# Patient Record
Sex: Male | Born: 1935 | Race: White | Hispanic: No | Smoking: Former smoker
Health system: Southern US, Community
[De-identification: ages and names within clinical notes are randomized; demographics above are authoritative.]

## PROBLEM LIST (undated history)

## (undated) DIAGNOSIS — K219 Gastro-esophageal reflux disease without esophagitis: Secondary | ICD-10-CM

## (undated) DIAGNOSIS — E785 Hyperlipidemia, unspecified: Secondary | ICD-10-CM

## (undated) DIAGNOSIS — M1990 Unspecified osteoarthritis, unspecified site: Secondary | ICD-10-CM

## (undated) DIAGNOSIS — N289 Disorder of kidney and ureter, unspecified: Secondary | ICD-10-CM

## (undated) DIAGNOSIS — I82401 Acute embolism and thrombosis of unspecified deep veins of right lower extremity: Principal | ICD-10-CM

## (undated) DIAGNOSIS — Z860101 Personal history of adenomatous and serrated colon polyps: Secondary | ICD-10-CM

## (undated) DIAGNOSIS — J449 Chronic obstructive pulmonary disease, unspecified: Secondary | ICD-10-CM

## (undated) DIAGNOSIS — I10 Essential (primary) hypertension: Secondary | ICD-10-CM

## (undated) DIAGNOSIS — J8410 Pulmonary fibrosis, unspecified: Secondary | ICD-10-CM

## (undated) DIAGNOSIS — N529 Male erectile dysfunction, unspecified: Secondary | ICD-10-CM

## (undated) DIAGNOSIS — Z86010 Personal history of colonic polyps: Secondary | ICD-10-CM

## (undated) HISTORY — PX: CARDIOVASCULAR STRESS TEST: SHX262

## (undated) HISTORY — DX: Disorder of kidney and ureter, unspecified: N28.9

## (undated) HISTORY — DX: Unspecified osteoarthritis, unspecified site: M19.90

## (undated) HISTORY — DX: Personal history of colonic polyps: Z86.010

## (undated) HISTORY — DX: Hyperlipidemia, unspecified: E78.5

## (undated) HISTORY — DX: Pulmonary fibrosis, unspecified: J84.10

## (undated) HISTORY — DX: Essential (primary) hypertension: I10

## (undated) HISTORY — DX: Chronic obstructive pulmonary disease, unspecified: J44.9

## (undated) HISTORY — PX: CATARACT EXTRACTION: SUR2

## (undated) HISTORY — DX: Male erectile dysfunction, unspecified: N52.9

## (undated) HISTORY — PX: ORIF CLAVICLE FRACTURE: SUR924

## (undated) HISTORY — DX: Personal history of adenomatous and serrated colon polyps: Z86.0101

## (undated) HISTORY — PX: LUNG BIOPSY: SHX232

## (undated) HISTORY — DX: Acute embolism and thrombosis of unspecified deep veins of right lower extremity: I82.401

## (undated) HISTORY — DX: Gastro-esophageal reflux disease without esophagitis: K21.9

## (undated) HISTORY — PX: ORIF ULNAR FRACTURE: SHX5417

---

## 2000-05-27 LAB — HM COLONOSCOPY

## 2002-05-27 LAB — FECAL OCCULT BLOOD, GUAIAC: Fecal Occult Blood: NEGATIVE

## 2002-12-09 ENCOUNTER — Encounter: Payer: Self-pay | Admitting: Internal Medicine

## 2002-12-09 LAB — CONVERTED CEMR LAB: PSA: 1.42 ng/mL

## 2003-01-09 ENCOUNTER — Encounter: Payer: Self-pay | Admitting: *Deleted

## 2003-01-12 ENCOUNTER — Ambulatory Visit (HOSPITAL_COMMUNITY): Admission: RE | Admit: 2003-01-12 | Discharge: 2003-01-12 | Payer: Self-pay | Admitting: *Deleted

## 2003-07-14 ENCOUNTER — Other Ambulatory Visit: Payer: Self-pay

## 2004-04-02 ENCOUNTER — Ambulatory Visit: Payer: Self-pay | Admitting: Internal Medicine

## 2004-04-22 ENCOUNTER — Ambulatory Visit: Payer: Self-pay | Admitting: Internal Medicine

## 2004-05-23 ENCOUNTER — Ambulatory Visit: Payer: Self-pay | Admitting: Internal Medicine

## 2004-06-20 ENCOUNTER — Ambulatory Visit: Payer: Self-pay | Admitting: Internal Medicine

## 2004-07-18 ENCOUNTER — Ambulatory Visit: Payer: Self-pay | Admitting: Internal Medicine

## 2004-08-02 ENCOUNTER — Ambulatory Visit: Payer: Self-pay | Admitting: Internal Medicine

## 2004-08-15 ENCOUNTER — Ambulatory Visit: Payer: Self-pay | Admitting: Internal Medicine

## 2004-09-13 ENCOUNTER — Ambulatory Visit: Payer: Self-pay | Admitting: Internal Medicine

## 2004-10-04 ENCOUNTER — Ambulatory Visit: Payer: Self-pay

## 2004-10-11 ENCOUNTER — Ambulatory Visit: Payer: Self-pay | Admitting: Internal Medicine

## 2004-11-07 ENCOUNTER — Ambulatory Visit: Payer: Self-pay | Admitting: Internal Medicine

## 2004-11-08 ENCOUNTER — Ambulatory Visit: Payer: Self-pay | Admitting: Internal Medicine

## 2004-12-06 ENCOUNTER — Ambulatory Visit: Payer: Self-pay | Admitting: Internal Medicine

## 2005-03-03 ENCOUNTER — Ambulatory Visit: Payer: Self-pay | Admitting: Internal Medicine

## 2005-03-18 ENCOUNTER — Ambulatory Visit: Payer: Self-pay | Admitting: Internal Medicine

## 2005-05-09 ENCOUNTER — Ambulatory Visit: Payer: Self-pay | Admitting: Internal Medicine

## 2005-05-28 ENCOUNTER — Ambulatory Visit: Payer: Self-pay | Admitting: Internal Medicine

## 2005-06-06 ENCOUNTER — Ambulatory Visit: Payer: Self-pay

## 2005-08-06 ENCOUNTER — Ambulatory Visit: Payer: Self-pay | Admitting: Internal Medicine

## 2005-11-18 ENCOUNTER — Ambulatory Visit: Payer: Self-pay | Admitting: Internal Medicine

## 2005-12-23 ENCOUNTER — Ambulatory Visit: Payer: Self-pay | Admitting: Internal Medicine

## 2006-02-27 ENCOUNTER — Ambulatory Visit: Payer: Self-pay | Admitting: Internal Medicine

## 2006-04-24 ENCOUNTER — Ambulatory Visit: Payer: Self-pay | Admitting: Internal Medicine

## 2006-05-06 ENCOUNTER — Ambulatory Visit: Payer: Self-pay | Admitting: Internal Medicine

## 2006-07-07 ENCOUNTER — Ambulatory Visit: Payer: Self-pay | Admitting: Internal Medicine

## 2006-07-16 ENCOUNTER — Ambulatory Visit: Payer: Self-pay | Admitting: Internal Medicine

## 2006-07-25 ENCOUNTER — Ambulatory Visit: Payer: Self-pay | Admitting: Internal Medicine

## 2006-08-06 ENCOUNTER — Ambulatory Visit: Payer: Self-pay | Admitting: Internal Medicine

## 2006-08-25 ENCOUNTER — Ambulatory Visit: Payer: Self-pay | Admitting: Internal Medicine

## 2006-09-24 ENCOUNTER — Ambulatory Visit: Payer: Self-pay | Admitting: Internal Medicine

## 2006-10-13 ENCOUNTER — Encounter: Payer: Self-pay | Admitting: Internal Medicine

## 2006-10-13 DIAGNOSIS — J309 Allergic rhinitis, unspecified: Secondary | ICD-10-CM | POA: Insufficient documentation

## 2006-10-13 DIAGNOSIS — J439 Emphysema, unspecified: Secondary | ICD-10-CM

## 2006-10-13 DIAGNOSIS — K219 Gastro-esophageal reflux disease without esophagitis: Secondary | ICD-10-CM

## 2006-10-13 DIAGNOSIS — E785 Hyperlipidemia, unspecified: Secondary | ICD-10-CM

## 2006-10-22 ENCOUNTER — Ambulatory Visit: Payer: Self-pay | Admitting: Internal Medicine

## 2006-10-23 LAB — CONVERTED CEMR LAB
CO2: 30 meq/L (ref 19–32)
Cholesterol: 111 mg/dL (ref 0–200)
Creatinine, Ser: 1.6 mg/dL — ABNORMAL HIGH (ref 0.4–1.5)
LDL Cholesterol: 55 mg/dL (ref 0–99)
Phosphorus: 3.5 mg/dL (ref 2.3–4.6)
Potassium: 3.6 meq/L (ref 3.5–5.1)
Sodium: 145 meq/L (ref 135–145)
Total CHOL/HDL Ratio: 3.5
Triglycerides: 124 mg/dL (ref 0–149)

## 2006-11-17 ENCOUNTER — Telehealth (INDEPENDENT_AMBULATORY_CARE_PROVIDER_SITE_OTHER): Payer: Self-pay | Admitting: *Deleted

## 2006-12-17 ENCOUNTER — Encounter: Payer: Self-pay | Admitting: Internal Medicine

## 2007-04-13 ENCOUNTER — Telehealth (INDEPENDENT_AMBULATORY_CARE_PROVIDER_SITE_OTHER): Payer: Self-pay | Admitting: *Deleted

## 2007-04-20 ENCOUNTER — Ambulatory Visit: Payer: Self-pay | Admitting: Internal Medicine

## 2007-04-20 DIAGNOSIS — N184 Chronic kidney disease, stage 4 (severe): Secondary | ICD-10-CM

## 2007-04-26 LAB — CONVERTED CEMR LAB
Albumin: 3.6 g/dL (ref 3.5–5.2)
GFR calc Af Amer: 63 mL/min
Glucose, Bld: 114 mg/dL — ABNORMAL HIGH (ref 70–99)
HDL: 32.5 mg/dL — ABNORMAL LOW (ref >39.0)
LDL Cholesterol: 55 mg/dL (ref 0–99)
Phosphorus: 3.3 mg/dL (ref 2.3–4.6)
Potassium: 4 meq/L (ref 3.5–5.1)
Sodium: 146 meq/L — ABNORMAL HIGH (ref 135–145)
TSH: 1.94 microintl units/mL (ref 0.35–5.50)
Total CHOL/HDL Ratio: 3.5
Triglycerides: 128 mg/dL (ref 0–149)
VLDL: 26 mg/dL (ref 0–40)

## 2007-06-09 ENCOUNTER — Telehealth (INDEPENDENT_AMBULATORY_CARE_PROVIDER_SITE_OTHER): Payer: Self-pay | Admitting: *Deleted

## 2007-07-08 ENCOUNTER — Encounter: Payer: Self-pay | Admitting: Internal Medicine

## 2007-08-06 ENCOUNTER — Telehealth (INDEPENDENT_AMBULATORY_CARE_PROVIDER_SITE_OTHER): Payer: Self-pay | Admitting: *Deleted

## 2007-08-18 ENCOUNTER — Encounter: Payer: Self-pay | Admitting: Internal Medicine

## 2007-08-18 ENCOUNTER — Ambulatory Visit: Payer: Self-pay | Admitting: Internal Medicine

## 2007-09-20 ENCOUNTER — Telehealth (INDEPENDENT_AMBULATORY_CARE_PROVIDER_SITE_OTHER): Payer: Self-pay | Admitting: *Deleted

## 2007-09-29 ENCOUNTER — Ambulatory Visit: Payer: Self-pay | Admitting: Internal Medicine

## 2007-10-15 LAB — CONVERTED CEMR LAB
ALT: 21 units/L (ref 0–53)
Chloride: 107 meq/L (ref 96–112)
Creatinine,U: 25.2 mg/dL
GFR calc Af Amer: 76 mL/min
Glucose, Bld: 190 mg/dL — ABNORMAL HIGH (ref 70–99)
HDL: 29.1 mg/dL — ABNORMAL LOW (ref >39.0)
LDL Cholesterol: 48 mg/dL (ref 0–99)
Microalb, Ur: 0.8 mg/dL (ref 0.0–1.9)
Phosphorus: 1.7 mg/dL — ABNORMAL LOW (ref 2.3–4.6)
Potassium: 3.2 meq/L — ABNORMAL LOW (ref 3.5–5.1)
Sodium: 146 meq/L — ABNORMAL HIGH (ref 135–145)
Total Bilirubin: 1.2 mg/dL (ref 0.3–1.2)
VLDL: 32 mg/dL (ref 0–40)

## 2007-10-21 ENCOUNTER — Ambulatory Visit: Payer: Self-pay | Admitting: Internal Medicine

## 2007-10-21 LAB — CONVERTED CEMR LAB
BUN: 23 mg/dL (ref 6–23)
Calcium: 9 mg/dL (ref 8.4–10.5)
Creatinine, Ser: 1.4 mg/dL (ref 0.4–1.5)
Phosphorus: 3.4 mg/dL (ref 2.3–4.6)
Potassium: 3.5 meq/L (ref 3.5–5.1)

## 2007-11-15 ENCOUNTER — Telehealth: Payer: Self-pay | Admitting: Internal Medicine

## 2007-12-01 ENCOUNTER — Telehealth (INDEPENDENT_AMBULATORY_CARE_PROVIDER_SITE_OTHER): Payer: Self-pay | Admitting: *Deleted

## 2008-01-10 ENCOUNTER — Telehealth: Payer: Self-pay | Admitting: Family Medicine

## 2008-02-09 ENCOUNTER — Telehealth: Payer: Self-pay | Admitting: Internal Medicine

## 2008-03-30 ENCOUNTER — Telehealth: Payer: Self-pay | Admitting: Internal Medicine

## 2008-04-04 ENCOUNTER — Ambulatory Visit: Payer: Self-pay | Admitting: Internal Medicine

## 2008-04-05 LAB — CONVERTED CEMR LAB
AST: 21 units/L (ref 0–37)
Albumin: 3.6 g/dL (ref 3.5–5.2)
Alkaline Phosphatase: 111 units/L (ref 39–117)
Basophils Relative: 0.3 % (ref 0.0–3.0)
CO2: 34 meq/L — ABNORMAL HIGH (ref 19–32)
Calcium: 9 mg/dL (ref 8.4–10.5)
Creatinine, Ser: 1.2 mg/dL (ref 0.4–1.5)
Eosinophils Relative: 2.8 % (ref 0.0–5.0)
GFR calc non Af Amer: 62 mL/min
LDL Cholesterol: 47 mg/dL (ref 0–99)
Lymphocytes Relative: 33.2 % (ref 12.0–46.0)
Neutrophils Relative %: 55.6 % (ref 43.0–77.0)
RBC: 5.01 M/uL (ref 4.22–5.81)
Total Protein: 7 g/dL (ref 6.0–8.3)
VLDL: 31 mg/dL (ref 0–40)
WBC: 7.6 10*3/uL (ref 4.5–10.5)

## 2008-04-26 ENCOUNTER — Encounter: Payer: Self-pay | Admitting: Internal Medicine

## 2008-04-26 ENCOUNTER — Ambulatory Visit: Payer: Self-pay

## 2008-05-01 ENCOUNTER — Encounter: Payer: Self-pay | Admitting: Internal Medicine

## 2008-05-02 ENCOUNTER — Encounter: Payer: Self-pay | Admitting: Internal Medicine

## 2008-05-02 DIAGNOSIS — R21 Rash and other nonspecific skin eruption: Secondary | ICD-10-CM

## 2008-05-09 ENCOUNTER — Telehealth: Payer: Self-pay | Admitting: Internal Medicine

## 2008-05-24 ENCOUNTER — Ambulatory Visit: Payer: Self-pay | Admitting: Family Medicine

## 2008-05-25 ENCOUNTER — Telehealth (INDEPENDENT_AMBULATORY_CARE_PROVIDER_SITE_OTHER): Payer: Self-pay | Admitting: Internal Medicine

## 2008-06-01 ENCOUNTER — Ambulatory Visit: Payer: Self-pay | Admitting: Family Medicine

## 2008-06-01 DIAGNOSIS — IMO0002 Reserved for concepts with insufficient information to code with codable children: Secondary | ICD-10-CM | POA: Insufficient documentation

## 2008-06-13 ENCOUNTER — Ambulatory Visit: Payer: Self-pay | Admitting: Internal Medicine

## 2008-06-16 ENCOUNTER — Ambulatory Visit: Payer: Self-pay | Admitting: Internal Medicine

## 2008-07-05 ENCOUNTER — Ambulatory Visit: Payer: Self-pay | Admitting: Internal Medicine

## 2008-07-06 LAB — CONVERTED CEMR LAB
BUN: 20 mg/dL (ref 6–23)
CO2: 34 meq/L — ABNORMAL HIGH (ref 19–32)
GFR calc non Af Amer: 62 mL/min
Glucose, Bld: 119 mg/dL — ABNORMAL HIGH (ref 70–99)
Hgb A1c MFr Bld: 7 % — ABNORMAL HIGH (ref 4.6–6.0)
Potassium: 3.7 meq/L (ref 3.5–5.1)
Sodium: 146 meq/L — ABNORMAL HIGH (ref 135–145)

## 2008-07-18 ENCOUNTER — Telehealth: Payer: Self-pay | Admitting: Internal Medicine

## 2008-07-20 ENCOUNTER — Encounter: Payer: Self-pay | Admitting: Internal Medicine

## 2008-07-20 ENCOUNTER — Ambulatory Visit: Payer: Self-pay | Admitting: Internal Medicine

## 2008-09-11 ENCOUNTER — Encounter: Payer: Self-pay | Admitting: Internal Medicine

## 2008-10-09 ENCOUNTER — Ambulatory Visit: Payer: Self-pay | Admitting: Family Medicine

## 2008-11-01 ENCOUNTER — Telehealth: Payer: Self-pay | Admitting: Internal Medicine

## 2008-11-10 ENCOUNTER — Ambulatory Visit: Payer: Self-pay | Admitting: Internal Medicine

## 2008-11-13 LAB — CONVERTED CEMR LAB
ALT: 24 units/L (ref 0–53)
AST: 29 units/L (ref 0–37)
Albumin: 3.9 g/dL (ref 3.5–5.2)
Basophils Absolute: 0 10*3/uL (ref 0.0–0.1)
Basophils Relative: 0.4 % (ref 0.0–3.0)
CO2: 33 meq/L — ABNORMAL HIGH (ref 19–32)
Cholesterol: 113 mg/dL (ref 0–200)
Creatinine, Ser: 1.3 mg/dL (ref 0.4–1.5)
Eosinophils Absolute: 0.3 10*3/uL (ref 0.0–0.7)
Glucose, Bld: 130 mg/dL — ABNORMAL HIGH (ref 70–99)
HDL: 39.2 mg/dL (ref >39.00)
Hgb A1c MFr Bld: 6.9 % — ABNORMAL HIGH (ref 4.6–6.5)
Lymphocytes Relative: 37.5 % (ref 12.0–46.0)
MCHC: 34.4 g/dL (ref 30.0–36.0)
Monocytes Relative: 7.1 % (ref 3.0–12.0)
Neutrophils Relative %: 51.7 % (ref 43.0–77.0)
Phosphorus: 2.8 mg/dL (ref 2.3–4.6)
RBC: 4.95 M/uL (ref 4.22–5.81)
Total Protein: 7.3 g/dL (ref 6.0–8.3)
Triglycerides: 126 mg/dL (ref 0.0–149.0)
VLDL: 25.2 mg/dL (ref 0.0–40.0)

## 2009-01-24 ENCOUNTER — Ambulatory Visit: Payer: Self-pay | Admitting: Family Medicine

## 2009-02-15 ENCOUNTER — Telehealth: Payer: Self-pay | Admitting: Internal Medicine

## 2009-03-14 ENCOUNTER — Ambulatory Visit: Payer: Self-pay | Admitting: Internal Medicine

## 2009-03-20 LAB — CONVERTED CEMR LAB
Basophils Relative: 0.6 % (ref 0.0–3.0)
Calcium: 9.2 mg/dL (ref 8.4–10.5)
Eosinophils Relative: 4.2 % (ref 0.0–5.0)
Glucose, Bld: 145 mg/dL — ABNORMAL HIGH (ref 70–99)
Lymphocytes Relative: 31.4 % (ref 12.0–46.0)
Neutrophils Relative %: 56.5 % (ref 43.0–77.0)
Phosphorus: 2.1 mg/dL — ABNORMAL LOW (ref 2.3–4.6)
Potassium: 4.1 meq/L (ref 3.5–5.1)
RBC: 5.18 M/uL (ref 4.22–5.81)
Sodium: 148 meq/L — ABNORMAL HIGH (ref 135–145)
WBC: 10.2 10*3/uL (ref 4.5–10.5)

## 2009-03-22 ENCOUNTER — Ambulatory Visit: Payer: Self-pay | Admitting: Family Medicine

## 2009-03-22 ENCOUNTER — Encounter (INDEPENDENT_AMBULATORY_CARE_PROVIDER_SITE_OTHER): Payer: Self-pay | Admitting: Internal Medicine

## 2009-03-29 ENCOUNTER — Telehealth (INDEPENDENT_AMBULATORY_CARE_PROVIDER_SITE_OTHER): Payer: Self-pay | Admitting: Internal Medicine

## 2009-04-03 ENCOUNTER — Telehealth: Payer: Self-pay | Admitting: Internal Medicine

## 2009-05-09 ENCOUNTER — Ambulatory Visit: Payer: Self-pay | Admitting: Cardiovascular Disease

## 2009-05-09 ENCOUNTER — Inpatient Hospital Stay: Payer: Self-pay | Admitting: Internal Medicine

## 2009-05-09 ENCOUNTER — Ambulatory Visit: Payer: Self-pay | Admitting: Internal Medicine

## 2009-05-10 ENCOUNTER — Telehealth: Payer: Self-pay | Admitting: Internal Medicine

## 2009-05-12 ENCOUNTER — Encounter: Payer: Self-pay | Admitting: Internal Medicine

## 2009-05-22 ENCOUNTER — Ambulatory Visit: Payer: Self-pay | Admitting: Internal Medicine

## 2009-05-30 ENCOUNTER — Ambulatory Visit: Payer: Self-pay | Admitting: Internal Medicine

## 2009-05-30 ENCOUNTER — Encounter: Payer: Self-pay | Admitting: Internal Medicine

## 2009-06-19 ENCOUNTER — Telehealth: Payer: Self-pay | Admitting: Internal Medicine

## 2009-06-20 ENCOUNTER — Encounter: Payer: Self-pay | Admitting: Internal Medicine

## 2009-07-09 ENCOUNTER — Ambulatory Visit: Payer: Self-pay | Admitting: Internal Medicine

## 2009-07-13 ENCOUNTER — Encounter: Payer: Self-pay | Admitting: Internal Medicine

## 2009-07-26 ENCOUNTER — Telehealth: Payer: Self-pay | Admitting: Internal Medicine

## 2009-08-06 ENCOUNTER — Ambulatory Visit: Payer: Self-pay | Admitting: Internal Medicine

## 2009-08-07 LAB — CONVERTED CEMR LAB
ALT: 20 units/L (ref 0–53)
CO2: 32 meq/L (ref 19–32)
Chloride: 111 meq/L (ref 96–112)
Eosinophils Relative: 4.6 % (ref 0.0–5.0)
GFR calc non Af Amer: 62.02 mL/min (ref >60)
HCT: 45.6 % (ref 39.0–52.0)
HDL: 39.1 mg/dL (ref >39.00)
Hemoglobin: 15.6 g/dL (ref 13.0–17.0)
Hgb A1c MFr Bld: 7.3 % — ABNORMAL HIGH (ref 4.6–6.5)
Lymphs Abs: 2.8 10*3/uL (ref 0.7–4.0)
Monocytes Relative: 7.9 % (ref 3.0–12.0)
Neutro Abs: 4.7 10*3/uL (ref 1.4–7.7)
Platelets: 223 10*3/uL (ref 150.0–400.0)
Sodium: 147 meq/L — ABNORMAL HIGH (ref 135–145)
TSH: 1.64 microintl units/mL (ref 0.35–5.50)
Total Bilirubin: 0.7 mg/dL (ref 0.3–1.2)
Total Protein: 7.4 g/dL (ref 6.0–8.3)
Triglycerides: 120 mg/dL (ref 0.0–149.0)
VLDL: 24 mg/dL (ref 0.0–40.0)
WBC: 8.7 10*3/uL (ref 4.5–10.5)

## 2009-10-24 LAB — HM DIABETES EYE EXAM

## 2009-11-21 ENCOUNTER — Telehealth: Payer: Self-pay | Admitting: Internal Medicine

## 2009-12-04 ENCOUNTER — Telehealth: Payer: Self-pay | Admitting: Internal Medicine

## 2010-01-09 ENCOUNTER — Ambulatory Visit: Payer: Self-pay | Admitting: Family Medicine

## 2010-01-09 DIAGNOSIS — R05 Cough: Secondary | ICD-10-CM

## 2010-02-11 ENCOUNTER — Ambulatory Visit: Payer: Self-pay | Admitting: Internal Medicine

## 2010-02-11 DIAGNOSIS — R1031 Right lower quadrant pain: Secondary | ICD-10-CM

## 2010-02-12 LAB — CONVERTED CEMR LAB
Calcium: 9.2 mg/dL (ref 8.4–10.5)
Glucose, Bld: 152 mg/dL — ABNORMAL HIGH (ref 70–99)
Hgb A1c MFr Bld: 7.3 % — ABNORMAL HIGH (ref 4.6–6.5)
Potassium: 3.6 meq/L (ref 3.5–5.1)
Sodium: 145 meq/L (ref 135–145)

## 2010-04-04 ENCOUNTER — Telehealth: Payer: Self-pay | Admitting: Family Medicine

## 2010-04-04 ENCOUNTER — Ambulatory Visit: Payer: Self-pay | Admitting: Family Medicine

## 2010-04-04 DIAGNOSIS — R079 Chest pain, unspecified: Secondary | ICD-10-CM

## 2010-04-09 ENCOUNTER — Telehealth: Payer: Self-pay | Admitting: Internal Medicine

## 2010-04-09 ENCOUNTER — Ambulatory Visit: Payer: Self-pay | Admitting: Internal Medicine

## 2010-04-09 DIAGNOSIS — M1990 Unspecified osteoarthritis, unspecified site: Secondary | ICD-10-CM | POA: Insufficient documentation

## 2010-04-11 ENCOUNTER — Encounter: Payer: Self-pay | Admitting: Internal Medicine

## 2010-04-17 ENCOUNTER — Telehealth (INDEPENDENT_AMBULATORY_CARE_PROVIDER_SITE_OTHER): Payer: Self-pay | Admitting: *Deleted

## 2010-04-23 ENCOUNTER — Encounter: Payer: Self-pay | Admitting: Internal Medicine

## 2010-04-24 ENCOUNTER — Encounter: Payer: Self-pay | Admitting: Internal Medicine

## 2010-05-09 ENCOUNTER — Encounter: Payer: Self-pay | Admitting: Internal Medicine

## 2010-05-21 ENCOUNTER — Telehealth: Payer: Self-pay | Admitting: Internal Medicine

## 2010-05-22 ENCOUNTER — Ambulatory Visit: Payer: Self-pay | Admitting: Internal Medicine

## 2010-05-22 DIAGNOSIS — M79609 Pain in unspecified limb: Secondary | ICD-10-CM | POA: Insufficient documentation

## 2010-05-29 ENCOUNTER — Encounter: Payer: Self-pay | Admitting: Internal Medicine

## 2010-06-05 ENCOUNTER — Ambulatory Visit: Payer: Self-pay | Admitting: Sports Medicine

## 2010-06-20 ENCOUNTER — Encounter
Admission: RE | Admit: 2010-06-20 | Discharge: 2010-06-20 | Payer: Self-pay | Source: Home / Self Care | Attending: *Deleted | Admitting: *Deleted

## 2010-06-23 LAB — CONVERTED CEMR LAB
Albumin: 3.7 g/dL (ref 3.5–5.2)
BUN: 19 mg/dL (ref 6–23)
Basophils Relative: 0.2 % (ref 0.0–1.0)
CO2: 34 meq/L — ABNORMAL HIGH (ref 19–32)
Cholesterol: 183 mg/dL (ref 0–200)
Creatinine, Ser: 1.2 mg/dL (ref 0.4–1.5)
Eosinophils Absolute: 0.2 10*3/uL (ref 0.0–0.6)
GFR calc non Af Amer: 63 mL/min
HDL: 38 mg/dL — ABNORMAL LOW (ref >39.0)
Hemoglobin: 16.8 g/dL (ref 13.0–17.0)
Hgb A1c MFr Bld: 7.9 % — ABNORMAL HIGH (ref 4.6–6.0)
Lymphocytes Relative: 27.4 % (ref 12.0–46.0)
MCV: 95.3 fL (ref 78.0–100.0)
Monocytes Absolute: 0.5 10*3/uL (ref 0.2–0.7)
Monocytes Relative: 6 % (ref 3.0–11.0)
Neutro Abs: 5.9 10*3/uL (ref 1.4–7.7)
Phosphorus: 2.6 mg/dL (ref 2.3–4.6)
Platelets: 262 10*3/uL (ref 150–400)
Potassium: 3.7 meq/L (ref 3.5–5.1)
Sodium: 142 meq/L (ref 135–145)
Triglycerides: 193 mg/dL — ABNORMAL HIGH (ref 0–149)

## 2010-06-25 NOTE — Progress Notes (Signed)
Summary: Testing supplies ([facility])  Phone Note Other Incoming Call back at [phone] Message from:  Ranelle Oyster on June 19, 2009 9:01 AM  Caller: [facility], [facility] Summary of Call: Received fax for testing supplies.  Called patient and left a message for return call to verify that he knows about and requested there services. Initial call taken by: Linde Gillis CMA Duncan Dull),  June 19, 2009 9:02 AM  Follow-up for Phone Call        Spoke with patient, he is aware and did authorize this company to provide him with his testing supplies, form in your IN box Follow-up by: Linde Gillis CMA Duncan Dull),  June 19, 2009 3:03 PM  Additional Follow-up for Phone Call Additional follow up Details #1::        form done approved three times a day testing just in case. He generally tests two times a day at least Additional Follow-up by: Cindee Salt MD,  June 20, 2009 7:57 AM    Additional Follow-up for Phone Call Additional follow up Details #2::    form faxed and scanned. Follow-up by: Mervin Hack CMA Duncan Dull),  June 20, 2009 9:05 AM

## 2010-06-25 NOTE — Letter (Signed)
Summary: [facility]  [facility]   Imported By: Lester Somerset 04/27/2010 10:38:57  _____________________________________________________________________  External Attachment:    Type:   Image     Comment:   External Document

## 2010-06-25 NOTE — Assessment & Plan Note (Signed)
Summary: EARLY NEXT WEEK FOLLOW UP PER DR G/RBH   Vital Signs:  Patient profile:   77 year old male Weight:      203 pounds O2 Sat:      95 % on Room air Temp:     98.3 degrees F oral Pulse rate:   72 / minute Pulse rhythm:   regular Resp:     24 per minute BP sitting:   150 / 90  (left arm) Cuff size:   large  Vitals Entered By: Mervin Hack CMA Duncan Dull) (April 09, 2010 11:31 AM)  O2 Flow:  Room air CC: follow-up visit   History of Present Illness: Feels a little better No fever Still has some SOB all the time Still has some lower anterior right chest pain---this is better. Still seems pleuritic Hasn't been consistent with oxygen use  only occ cough sometimes get sputum but not too much  sleeping okay but awakens a couple of times---not due to dyspnea  Allergies: 1)  * Theo-Dur (Theophylline)  Past History:  Past medical, surgical, family and social histories (including risk factors) reviewed for relevance to current acute and chronic problems.  Past Medical History: Allergic rhinitis Colonic polyps, hx of--adenomatous COPD/pulm fibrosis--------------------------------------------Dr Freda Munro Diabetes mellitus, type II GERD Erectile dysfunction Cardiomyopathy Hyperlipidemia Renal insufficiency Osteoarthritis  Past Surgical History: Reviewed history from 10/13/2006 and no changes required. CP- ? CAD 08/04 Pneumonia-rehab 2003 Left clavicle fracture- child Left readius/ ulna fracture child Right 1 H x 2 Left 1 H x 1 Right lung bx. negative 2001 Cataracts/ IOL OU, Epes 2005, Pulman ?  2000 Myoview stress negative EF 65-70% 08/04 ABI's normal 01/07  Family History: Reviewed history from 10/13/2006 and no changes required. Father: Died  at age of 58, NIDDM Mother: Died in her 47's, ? heart Siblings: 3 sister, one adopted No cancer  Social History: Reviewed history from 08/06/2009 and no changes required. Marital Status: Widowed Has platonic  lady lfriend since  ~2008  Cleda Daub Children: 2 sons, 1 daughter  Retired- Warden/ranger Currently seeing Corrie Dandy  Review of Systems       appetite is okay no vomiting or diarrhea Back pain is better but still having left knee pain  Physical Exam  General:  alert.  NAD Neck:  supple, no masses, and no cervical lymphadenopathy.   Lungs:  normal respiratory effort, no intercostal retractions, and no accessory muscle use.  Decreased air movement but no wheezes or prolonged expiration Mild bibasilar fine crackles Heart:  normal rate, regular rhythm, no murmur, and no gallop.   Msk:  left knee with some crepitus but no meniscus or ligament findings   Impression & Recommendations:  Problem # 1:  COPD (ICD-496) Assessment Improved close to baseline needs to keep oxygen on finish out meds  His updated medication list for this problem includes:    Duoneb 0.5-2.5 (3) Mg/6ml Soln (Ipratropium-albuterol) .Marland Kitchen... 1 unit in nebulizer every 6 hours    Combivent 103-18 Mcg/act Aero (Ipratropium-albuterol) .Marland Kitchen... 2 puffs every 6 hours when not using nebulizer    Advair Diskus 250-50 Mcg/dose Misc (Fluticasone-salmeterol) ..... Use two times a day  Problem # 2:  OSTEOARTHRITIS (ICD-715.90) Assessment: Comment Only will refill the tramadol  His updated medication list for this problem includes:    Aspirin 81 Mg Tabs (Aspirin) .Marland Kitchen... Take one by mouth daily    Tramadol Hcl 50 Mg Tabs (Tramadol hcl) .Marland Kitchen... Take one by mouth two times a day as needed pain  Complete  Medication List: 1)  Temazepam 30 Mg Caps (Temazepam) .... Take 1 capsule by mouth at bedtime 2)  Klor-con M20 20 Meq Tbcr (Potassium chloride crys cr) .... Take one by mouth every day 3)  Novolin R 100 Unit/ml Soln (Insulin regular human) .Marland Kitchen.. 10 units two times a day 4)  Novolin N 100 Unit/ml Susp (Insulin isophane human) .... 25  units two times a day 5)  Simvastatin 20 Mg Tabs (Simvastatin) .... Take 1 tablet by mouth once a  day 6)  Lasix 80 Mg Tabs (Furosemide) .... Take 1 by mouth once daily 7)  Duoneb 0.5-2.5 (3) Mg/36ml Soln (Ipratropium-albuterol) .Marland Kitchen.. 1 unit in nebulizer every 6 hours 8)  Combivent 103-18 Mcg/act Aero (Ipratropium-albuterol) .... 2 puffs every 6 hours when not using nebulizer 9)  Omeprazole 20 Mg Cpdr (Omeprazole) .Marland Kitchen.. 1 tab daily for acid reflux 10)  Advair Diskus 250-50 Mcg/dose Misc (Fluticasone-salmeterol) .... Use two times a day 11)  Aspirin 81 Mg Tabs (Aspirin) .... Take one by mouth daily 12)  Oxygen  .... Uses 2 liters 13)  Sure Comfort Insulin Syringe 29g X 1/2" 0.3 Ml Misc (Insulin syringe-needle u-100) .... Use as directed 14)  Tramadol Hcl 50 Mg Tabs (Tramadol hcl) .... Take one by mouth two times a day as needed pain 15)  Doxycycline Hyclate 100 Mg Caps (Doxycycline hyclate) .... Take one by mouth two times a day x 10 days  Patient Instructions: 1)  Please keep regular appt in March 2)  Call if your breathing doesn't completely come back to your usual in the next 1-2 weeks Prescriptions: TRAMADOL HCL 50 MG TABS (TRAMADOL HCL) take one by mouth two times a day as needed pain  #60 x 0   Entered and Authorized by:   Cindee Salt MD   Signed by:   Cindee Salt MD on 04/09/2010   Method used:   [facility]        [id number] ([facility]ail[facility]       [address]       [place], [place]  [address]       Ph: [phone] or [phone]       Fax: [phone]   RxID:   [id number] NOVOLIN N 100 UNIT/ML SUSP (INSULIN ISOPHANE HUMAN) 25  units two times a day  #1 month x 11   Entered and Authorized by:   Cindee Salt MD   Signed by:   Cindee Salt MD on 04/09/2010   Method used:   [facility]        [id number] ([facility]ail[facility]       [address]       [place], [place]  [address]       Ph: [phone] or [phone]       Fax: [phone]   RxID:   [id number] TEMAZEPAM 30 MG CAPS  (TEMAZEPAM) Take 1 capsule by mouth at bedtime  #30 x 3   Entered and Authorized by:   Cindee Salt MD   Signed by:   Cindee Salt MD on 04/09/2010   Method used:   Print then Give to Patient   RxID:   [id number] NOVOLIN R 100 UNIT/ML SOLN (INSULIN REGULAR HUMAN) 10 units two times a day  #10 [place] x 12   Entered by:   DeShannon  Katrinka Blazing CMA (AAMA)   Authorized by:   Cindee Salt MD   Signed by:   Mervin Hack CMA (AAMA) on 04/09/2010   Method used:   [facility]        [id number] ([facility]       [address]       [place], [place]  [address]       Ph: [phone] or [phone]       Fax: [phone]   RxID:   [id number]      Current Allergies (reviewed today): * THEO-DUR (THEOPHYLLINE)

## 2010-06-25 NOTE — Medication Information (Signed)
Summary: Order for Back Brace-Denied, patient does not want   Order for Back Brace-Denied, patient does not want   Imported By: Maryln Gottron 04/19/2010 13:50:22  _____________________________________________________________________  External Attachment:    Type:   Image     Comment:   External Document

## 2010-06-25 NOTE — Progress Notes (Signed)
Summary: regarding stool softener  Phone Note Call from Patient   Caller: Patient Summary of Call: Pt called and asked what would be a better stool softener thatn MOM.  I advised him to check with his pharmacist about something otc, maybe colace. Initial call taken by: Lowella Petties CMA, AAMA,  April 17, 2010 9:50 AM

## 2010-06-25 NOTE — Medication Information (Signed)
Summary: Diabetes Supplies/[facility]  [facility]abetes [facility]   Imported By: Lanelle Bal 06/23/2009 10:57:21  _____________________________________________________________________  External Attachment:    Type:   Image     Comment:   External Document

## 2010-06-25 NOTE — Progress Notes (Signed)
Summary: form for vacuum erection device  Phone Note From Pharmacy   Caller: [facility] Summary of Call: Form for vacuum erection device is on your desk. Initial call taken by: Lowella Petties CMA,  December 04, 2009 11:08 AM  Follow-up for Phone Call        I do not order these If he is interested in trying this, I would like him to see a urologist Follow-up by: Cindee Salt MD,  December 04, 2009 12:04 PM  Additional Follow-up for Phone Call Additional follow up Details #1::        form faxed back to [facility], form scanned. Spoke with patient and advised results.  Additional Follow-up by: Mervin Hack CMA Duncan Dull),  December 04, 2009 12:19 PM

## 2010-06-25 NOTE — Assessment & Plan Note (Signed)
Summary: SOB, CHEST PAIN X3DAYS/DS   Vital Signs:  Patient profile:   77 year old male Weight:      204 pounds O2 Sat:      92 % on Room air Temp:     98.0 degrees F oral Pulse rate:   88 / minute Pulse rhythm:   regular BP sitting:   132 / 88  (left arm) Cuff size:   large  Vitals Entered By: Selena Batten Dance CMA (AAMA) (April 04, 2010 10:09 AM)  O2 Flow:  Room air CC: Chest pain and SOB x3 days   History of Present Illness: CC: CP, SOB  77yo with h/o DM, O2 dependent COPD, CM presents with caregiver Corrie Dandy.  Hurting today 2 places.    1. h/o L sided pinched nerve for 2 mo now.  Working with chiro Dr. Anner Crete on this.  Not taking anything for it currently.  Has trouble walking from pinched nerve.    2. 3d h/o R sided abd/chest pain.  starts lower R ribcage radiates around ribcage to back.  Seems to be pretty constant.  increased cough recently and SOB, productive of more phlegm/mucous than normal.  + small amt blood.  Normally either dry cough or clear sputum.  Now more white sputum.  + pleuritic pain and worse with cough.  No fevers/chills, n/v/d, dysuria.  No change with food.  No left sided radiation.  Pain not heavy/tightness.  More sharp pain.  No new rash.  no h/o shingles.  No zostavax.  No recent falls, trauma.  has concentrator for night time use, had portable tank but when arrived to clinic noted portable was out of O2.  feeling better with 2L [place] currently.    for COPD - only using advair.  Sees Dr. Welton Flakes.  not using duoneb or combivent.    had flu shot 2 mo ago.  Current Medications (verified): 1)  Temazepam 30 Mg Caps (Temazepam) .... Take 1 Capsule By Mouth At Bedtime 2)  Klor-Con M20 20 Meq Tbcr (Potassium Chloride Crys Cr) .... Take One By Mouth Every Day 3)  Novolin R 100 Unit/ml Soln (Insulin Regular Human) .Marland Kitchen.. 10 Units Two Times A Day 4)  Novolin N 100 Unit/ml Susp (Insulin Isophane Human) .... 25  Units Two Times A Day 5)  Simvastatin 20 Mg  Tabs (Simvastatin) ....  Take 1 Tablet By Mouth Once A Day 6)  Lasix 80 Mg Tabs (Furosemide) .... Take 1 By Mouth Once Daily 7)  Duoneb 0.5-2.5 (3) Mg/42ml Soln (Ipratropium-Albuterol) .Marland Kitchen.. 1 Unit in Nebulizer Every 6 Hours 8)  Combivent 103-18 Mcg/act Aero (Ipratropium-Albuterol) .... 2 Puffs Every 6 Hours When Not Using Nebulizer 9)  Omeprazole 20 Mg Cpdr (Omeprazole) .Marland Kitchen.. 1 Tab Daily For Acid Reflux 10)  Advair Diskus 250-50 Mcg/dose Misc (Fluticasone-Salmeterol) .... Use Two Times A Day 11)  Aspirin 81 Mg Tabs (Aspirin) .... Take One By Mouth Daily 12)  Oxygen .... Uses 2 Liters 13)  Sure Comfort Insulin Syringe 29g X 1/2" 0.3 Ml Misc (Insulin Syringe-Needle U-100) .... Use As Directed 14)  Hydrocodone-Acetaminophen 5-325 Mg Tabs (Hydrocodone-Acetaminophen) .Marland Kitchen.. 1 Tab By Mouth Three Times A Day As Needed For Pain  Allergies: 1)  * Theo-Dur (Theophylline)  Past History:  Past Medical History: Last updated: 09/29/2007 Allergic rhinitis Colonic polyps, hx of--adenomatous COPD/pulm fibrosis--------------------------------------------Dr Freda Munro Diabetes mellitus, type II GERD Erectile dysfunction Cardiomyopathy Hyperlipidemia Renal insufficiency  Past Surgical History: Last updated: 10/13/2006 CP- ? CAD 08/04 Pneumonia-rehab 2003 Left clavicle fracture- child  Left readius/ ulna fracture child Right 1 H x 2 Left 1 H x 1 Right lung bx. negative 2001 Cataracts/ IOL OU, Epes 2005, [place]man ?  2000 Myoview stress negative EF 65-70% 08/04 ABI's normal 01/07  Social History: Last updated: 08/06/2009 Marital Status: Widowed Has platonic lady lfriend since  ~2008  Cleda Daub Children: 2 sons, 1 daughter  Retired- Warden/ranger Currently seeing Corrie Dandy  Review of Systems       per HPI  Physical Exam  General:  alert and normal appearance.   Head:  Normocephalic and atraumatic without obvious abnormalities. No apparent alopecia or balding. Sinuses nontender. Eyes:  Conjunctiva clear  bilaterally.  Mouth:  no erythema and no exudates.   MMM Neck:  supple, no masses, no thyromegaly, no carotid bruits, and no cervical lymphadenopathy.   Chest Wall:  a couple spots of right lateral ribcage tender, no other significant point tenderness. Lungs:  easily winded with walking.  no accessory muscle use.  no significant wheezing, but distant breath sounds, + coarse crackles RLL. Heart:  normal rate, regular rhythm, no murmur, and no gallop.   Abdomen:  soft, non-tender, no masses, no guarding, and no rigidity.  No murphy sign, no rebound.   Pulses:  2 + rad pulses Extremities:  no pedal edema, no cyanosis Neurologic:  alert & oriented X3 and strength normal in all extremities.  antalgic gait 2/2 L "pinched nerve". Skin:  no suspicious lesions and no ulcerations.     Impression & Recommendations:  Problem # 1:  CHEST PAIN, RIGHT (ICD-786.50) pleuritic in O2 dependent COPDer with increased SOB, cough, sputum production, and purulence.  obtain CXR to r/o PNA, rib fx, effusion.  Qualifies for mild COPD exacerbation.  treat as such with doxy and prednisone.  RTC next week for f/u with PCP, sooner if worsening.  Advised if CP becoming heaviness/tightneses or worsening, to seek urgent medical care.  doesn't sound cardiac in nature today.  could also be MSK as very positional in nature, however not reproducible today.  ? gallbladder although no other GI sxs.  Orders: T-2 View CXR (71020TC)  Problem # 2:  COPD (ICD-496) see above.  recommended restart albuterol/ipratropium he has at home.  he thinks has duoneb. His updated medication list for this problem includes:    Duoneb 0.5-2.5 (3) Mg/56ml Soln (Ipratropium-albuterol) .Marland Kitchen... 1 unit in nebulizer every 6 hours    Combivent 103-18 Mcg/act Aero (Ipratropium-albuterol) .Marland Kitchen... 2 puffs every 6 hours when not using nebulizer    Advair Diskus 250-50 Mcg/dose Misc (Fluticasone-salmeterol) ..... Use two times a day  Pulmonary Functions  Reviewed: O2 sat: 92 (04/04/2010)     Vaccines Reviewed: Pneumovax: Pneumovax (05/28/2003)   Flu Vax: Fluvax 3+ (02/11/2010)  Complete Medication List: 1)  Temazepam 30 Mg Caps (Temazepam) .... Take 1 capsule by mouth at bedtime 2)  Klor-con M20 20 Meq Tbcr (Potassium chloride crys cr) .... Take one by mouth every day 3)  Novolin R 100 Unit/ml Soln (Insulin regular human) .Marland Kitchen.. 10 units two times a day 4)  Novolin N 100 Unit/ml Susp (Insulin isophane human) .... 25  units two times a day 5)  Simvastatin 20 Mg Tabs (Simvastatin) .... Take 1 tablet by mouth once a day 6)  Lasix 80 Mg Tabs (Furosemide) .... Take 1 by mouth once daily 7)  Duoneb 0.5-2.5 (3) Mg/57ml Soln (Ipratropium-albuterol) .Marland Kitchen.. 1 unit in nebulizer every 6 hours 8)  Combivent 103-18 Mcg/act Aero (Ipratropium-albuterol) .... 2 puffs every 6 hours when  not using nebulizer 9)  Omeprazole 20 Mg Cpdr (Omeprazole) .Marland Kitchen.. 1 tab daily for acid reflux 10)  Advair Diskus 250-50 Mcg/dose Misc (Fluticasone-salmeterol) .... Use two times a day 11)  Aspirin 81 Mg Tabs (Aspirin) .... Take one by mouth daily 12)  Oxygen  .... Uses 2 liters 13)  Sure Comfort Insulin Syringe 29g X 1/2" 0.3 Ml Misc (Insulin syringe-needle u-100) .... Use as directed 14)  Tramadol Hcl 50 Mg Tabs (Tramadol hcl) .... Take one by mouth two times a day as needed pain 15)  Prednisone 20 Mg Tabs (Prednisone) .... Take 2 pills daily for 7days 16)  Doxycycline Hyclate 100 Mg Caps (Doxycycline hyclate) .... Take one by mouth two times a day x 10 days  Patient Instructions: 1)  Return early next week for follow up with Dr. Alphonsus Sias. 2)  I think you may have a COPD exacerbation.  treat with oral steroids as well as course of antibiotics. 3)  Tramadol for pain. 4)  Restart combivent. 5)  Return sooner if worsening pain, worsening shortness of breath, or not improving as expected. If chest pain getting worse, please seek urgent medical care.  I expect it to slowly get better  with time. 6)  Good to meet you today, call clinic with questions. Prescriptions: PREDNISONE 20 MG TABS (PREDNISONE) take 2 pills daily for 7days  #14 x 0   Entered and Authorized by:   Eustaquio Boyden  MD   Signed by:   Eustaquio Boyden  MD on 04/04/2010   Method used:   [facility]        [id number] ([facility]       [address]       [place], [place]  [address]       Ph: [phone] or [phone]       Fax: [phone]   RxID:   [id number] COMBIVENT 103-18 MCG/ACT AERO (IPRATROPIUM-ALBUTEROL) 2 puffs every 6 hours when not using nebulizer  #1 x 3   Entered and Authorized by:   Eustaquio Boyden  MD   Signed by:   Eustaquio Boyden  MD on 04/04/2010   Method used:   [facility]ically [facility]        [id number] ([facility]       [address]       [place], [place]  [address]       Ph: [phone] or [phone]       Fax: [phone]   RxID:   [id number] DOXYCYCLINE HYCLATE 100 MG CAPS (DOXYCYCLINE HYCLATE) take one by mouth two times a day x 10 days  #20 x 0   Entered and Authorized by:   Eustaquio Boyden  MD   Signed by:   Eustaquio Boyden  MD on 04/04/2010   Method used:   [facility]        [id number] ([facility]       [address]       [place], [place]  [address]       Ph: [phone] or [phone]       Fax: [phone]   RxID:   [id number] TRAMADOL HCL 50 MG TABS (TRAMADOL HCL) take one by mouth two times a day as needed pain  #30 x 0  Entered and Authorized by:   Eustaquio Boyden  MD   Signed by:   Eustaquio Boyden  MD on 04/04/2010   Method used:   [facility]        [id number] ([facility]       [address]       [place], [place]  [address]       Ph: [phone] or [phone]       Fax: [phone]   RxID:   [id number]    Orders Added: 1)  T-2 View CXR [71020TC] 2)  Est. Patient Level IV  [47425]    Current Allergies (reviewed today): * THEO-DUR (THEOPHYLLINE)

## 2010-06-25 NOTE — Progress Notes (Signed)
Summary: Form for back brace  Phone Note Outgoing Call Call back at Home Phone [phone]   Call placed by: DeShannon Smith CMA Duncan Dull),  April 09, 2010 3:47 PM Call placed to: Patient Summary of Call: Received form ( [facility]) asking Dr.Letvak to fill out for pt's lumbar orthosis for treatment of low back pain, per Dr.Letvak he doesn't remember ordering a back brace. Initial call taken by: Mervin Hack CMA Duncan Dull),  April 09, 2010 4:05 PM  Follow-up for Phone Call        left message on machine at home for patient to return my call.  DeShannon Katrinka Blazing CMA Duncan Dull)  April 09, 2010 4:05 PM   line busy x2 Mervin Hack CMA Duncan Dull)  April 10, 2010 4:32 PM   left message with son to have patient to call the office. DeShannon Smith CMA Duncan Dull)  April 11, 2010 3:29 PM   patient called back and said forget it, he thought it was gonna be free, pt does not want the back brace. DeShannon Katrinka Blazing CMA Duncan Dull)  April 11, 2010 3:43 PM   Please make sure the company knows or we will keep getting more forms Follow-up by: Cindee Salt MD,  April 11, 2010 5:28 PM  Additional Follow-up for Phone Call Additional follow up Details #1::        faxed form back stating that pt no longer wishes to order back brace and to cancel the order.. Additional Follow-up by: Mervin Hack CMA Duncan Dull),  April 12, 2010 8:31 AM

## 2010-06-25 NOTE — Progress Notes (Signed)
Summary: Rx Temazepam  Phone Note Refill Request Call back at [phone] Message from:  [facility] on July 26, 2009 8:59 AM  Refills Requested: Medication #1:  TEMAZEPAM 30 MG CAPS Take 1 capsule by mouth at bedtime   Last Refilled: 06/27/2009 Received faxed refill request, please advise.  Form in your IN box   Method Requested: Fax to Local Pharmacy Initial call taken by: Linde Gillis CMA Duncan Dull),  July 26, 2009 9:00 AM  Follow-up for Phone Call        okay #30 x 3 Follow-up by: Cindee Salt MD,  July 26, 2009 12:54 PM  Additional Follow-up for Phone Call Additional follow up Details #1::        Rx faxed to pharmacy Additional Follow-up by: DeShannon Smith CMA Duncan Dull),  July 26, 2009 1:42 PM    Prescriptions: TEMAZEPAM 30 MG CAPS (TEMAZEPAM) Take 1 capsule by mouth at bedtime  #30 x 3   Entered by:   Mervin Hack CMA (AAMA)   Authorized by:   Cindee Salt MD   Signed by:   Mervin Hack CMA (AAMA) on 07/26/2009   Method used:   Handwritten   RxID:   [id number]

## 2010-06-25 NOTE — Medication Information (Signed)
Summary: Medication Therapy Review/[facility]  Medication Therapy Review/[facility]   Imported By: Lanelle Bal 07/17/2009 08:34:13  _____________________________________________________________________  External Attachment:    Type:   Image     Comment:   External Document

## 2010-06-25 NOTE — Medication Information (Signed)
Summary: [facility]  [facility]   Imported By: Lanelle Bal 04/27/2010 08:25:38  _____________________________________________________________________  External Attachment:    Type:   Image     Comment:   External Document

## 2010-06-25 NOTE — Progress Notes (Signed)
Summary: refill request for temazepam  Phone Note Refill Request Message from:  Fax from Pharmacy  Refills Requested: Medication #1:  TEMAZEPAM 30 MG CAPS Take 1 capsule by mouth at bedtime   Last Refilled: 10/21/2009 Faxed request from [facility] [employer] is on  your desk.  Initial call taken by: Lowella Petties CMA,  November 21, 2009 8:49 AM  Follow-up for Phone Call        okay #30 x 3 Follow-up by: Cindee Salt MD,  November 21, 2009 3:08 PM  Additional Follow-up for Phone Call Additional follow up Details #1::        Rx faxed to pharmacy Additional Follow-up by: DeShannon Smith CMA Duncan Dull),  November 21, 2009 3:56 PM    Prescriptions: TEMAZEPAM 30 MG CAPS (TEMAZEPAM) Take 1 capsule by mouth at bedtime  #30 x 3   Entered by:   Mervin Hack CMA (AAMA)   Authorized by:   Cindee Salt MD   Signed by:   Mervin Hack CMA (AAMA) on 11/21/2009   Method used:   Handwritten   RxID:   [id number]

## 2010-06-25 NOTE — Progress Notes (Signed)
Summary: SOB/Chest pain  Phone Note Call from Patient Call back at Home Phone [phone]   Caller: Patient Call For: Dr.G Summary of Call: patient calling complaining of SOB and chest pain x3days, pt states the chest pain comes down the middle of his chest, pt was trying to wait until his appt with Dr.Letvak but he's having trouble sleeping at night. I advised pt that Dr.Letvak would be in this afternoon, pt stated he would see anyone scheduled pt with Dr.G today at 10:15am. Initial call taken by: Mervin Hack CMA Duncan Dull),  April 04, 2010 9:05 AM  Follow-up for Phone Call        noted. Follow-up by: Eustaquio Boyden  MD,  April 04, 2010 9:15 AM

## 2010-06-25 NOTE — Assessment & Plan Note (Signed)
Summary: 2 M F/U DLO   Vital Signs:  Patient profile:   77 year old male Weight:      210 pounds O2 Sat:      94 % on 2 L/min Temp:     98.2 degrees F oral Pulse rate:   70 / minute Pulse rhythm:   regular Resp:     22 per minute BP sitting:   118 / 70  (left arm) Cuff size:   large  Vitals Entered By: Mervin Hack CMA Duncan Dull) (August 06, 2009 11:14 AM)  O2 Flow:  2 L/min CC: follow-up   History of Present Illness: Feels better Thinks he is at baseline uses the oxygen all the time Cough is gone  Dr Welton Flakes had asked him to change advair to symbicort He hasn't made that change as yet has follow up soon--may be getting echo  No apparent heart trouble No recent ankle edema NO chest pain Occ palpitations--mostly if he walks more  Checks sugars twice a day Variable levels No adjustments on insulin Did have 1 low sugar reading---he states it was in 90's and he had slight shakes. No recent problems  using prilosec OTC for his GERD would like to get Rx This controls his hiatal hernia and reflux  Allergies: 1)  * Theo-Dur (Theophylline)  Past History:  Past medical, surgical, family and social histories (including risk factors) reviewed for relevance to current acute and chronic problems.  Past Medical History: Reviewed history from 09/29/2007 and no changes required. Allergic rhinitis Colonic polyps, hx of--adenomatous COPD/pulm fibrosis--------------------------------------------Dr Freda Munro Diabetes mellitus, type II GERD Erectile dysfunction Cardiomyopathy Hyperlipidemia Renal insufficiency  Past Surgical History: Reviewed history from 10/13/2006 and no changes required. CP- ? CAD 08/04 Pneumonia-rehab 2003 Left clavicle fracture- child Left readius/ ulna fracture child Right 1 H x 2 Left 1 H x 1 Right lung bx. negative 2001 Cataracts/ IOL OU, Epes 2005, Pulman ?  2000 Myoview stress negative EF 65-70% 08/04 ABI's normal 01/07  Family  History: Reviewed history from 10/13/2006 and no changes required. Father: Died  at age of 20, NIDDM Mother: Died in her 65's, ? heart Siblings: 3 sister, one adopted No cancer  Social History: Marital Status: Widowed Has platonic lady lfriend since  ~2008  Cleda Daub Children: 2 sons, 1 daughter  Retired- Warden/ranger Currently seeing Corrie Dandy  Review of Systems       appetite is good weight up 2# Occ sleep problems despite nightly temazepam some nocturia--then trouble reinitiating  Physical Exam  General:  alert and normal appearance.   Neck:  supple, no masses, no thyromegaly, no carotid bruits, and no cervical lymphadenopathy.   Lungs:  normal respiratory effort and normal breath sounds.   Heart:  normal rate, regular rhythm, no murmur, and no gallop.   Pulses:  faint in each foot Extremities:  no edema Skin:  no suspicious lesions and no ulcerations.   Psych:  normally interactive, good eye contact, not anxious appearing, and not depressed appearing.    Diabetes Management Exam:    Foot Exam (with socks and/or shoes not present):       Sensory-Pinprick/Light touch:          Left medial foot (L-4): normal          Left dorsal foot (L-5): normal          Left lateral foot (S-1): normal          Right medial foot (L-4): normal  Right dorsal foot (L-5): normal          Right lateral foot (S-1): normal       Inspection:          Left foot: abnormal             Comments: mild callous and dry skin          Right foot: abnormal             Comments: mild callous and dry skin       Nails:          Left foot: thickened          Right foot: thickened   Impression & Recommendations:  Problem # 1:  DIABETES MELLITUS, TYPE II (ICD-250.00) Assessment Unchanged  hopefully still acceptable control goal <8%,   action if much higher than 8 and certainly if >9%  His updated medication list for this problem includes:    Aspirin 81 Mg Tabs (Aspirin) .Marland Kitchen... Take one  by mouth daily    Novolin R 100 Unit/ml Soln (Insulin regular human) .Marland KitchenMarland KitchenMarland KitchenMarland Kitchen 10 units two times a day    Novolin N 100 Unit/ml Susp (Insulin isophane human) .Marland Kitchen... 25  units two times a day  Labs Reviewed: Creat: 1.4 (03/14/2009)     Last Eye Exam: Diploia--eye stroke. No retinopathy (08/24/2008) Reviewed HgBA1c results: 7.7 (03/14/2009)  6.9 (11/10/2008)  Orders: Venipuncture ([address]) TLB-A1C / Hgb A1C (Glycohemoglobin) (83036-A1C) TLB-Renal Function Panel (80069-RENAL) TLB-CBC Platelet - w/Differential (85025-CBCD) TLB-TSH (Thyroid Stimulating Hormone) (84443-TSH)  Problem # 2:  COPD (ICD-496) Assessment: Unchanged seems to be at baseline Dr Welton Flakes managing  His updated medication list for this problem includes:    Advair Diskus 250-50 Mcg/dose Misc (Fluticasone-salmeterol) ..... Use two times a day    Duoneb 0.5-2.5 (3) Mg/70ml Soln (Ipratropium-albuterol) .Marland Kitchen... 1 unit in nebulizer every 6 hours    Combivent 103-18 Mcg/act Aero (Ipratropium-albuterol) .Marland Kitchen... 2 puffs every 6 hours when not using nebulizer  Problem # 3:  HYPERLIPIDEMIA (ICD-272.4) Assessment: Unchanged  will reheck labs  no problems with the med  His updated medication list for this problem includes:    Simvastatin 20 Mg Tabs (Simvastatin) .Marland Kitchen... Take 1 tablet by mouth once a day  Labs Reviewed: SGOT: 29 (11/10/2008)   SGPT: 24 (11/10/2008)   HDL:39.20 (11/10/2008), 28.7 (04/04/2008)  LDL:49 (11/10/2008), 47 (04/04/2008)  Chol:113 (11/10/2008), 107 (04/04/2008)  Trig:126.0 (11/10/2008), 156 (04/04/2008)  Orders: TLB-Lipid Panel (80061-LIPID) TLB-Hepatic/Liver Function Pnl (80076-HEPATIC)  Problem # 4:  CARDIOMYOPATHY (ICD-425.4) Assessment: Unchanged no clear symptoms Dr Welton Flakes doing echo?  Complete Medication List: 1)  Temazepam 30 Mg Caps (Temazepam) .... Take 1 capsule by mouth at bedtime 2)  Klor-con M20 20 Meq Tbcr (Potassium chloride crys cr) .... Take one by mouth every day 3)  Aspirin 81 Mg Tabs  (Aspirin) .... Take one by mouth daily 4)  Advair Diskus 250-50 Mcg/dose Misc (Fluticasone-salmeterol) .... Use two times a day 5)  Oxygen  .... Use at bedtime 6)  Novolin R 100 Unit/ml Soln (Insulin regular human) .Marland Kitchen.. 10 units two times a day 7)  Novolin N 100 Unit/ml Susp (Insulin isophane human) .... 25  units two times a day 8)  Simvastatin 20 Mg Tabs (Simvastatin) .... Take 1 tablet by mouth once a day 9)  Sure Comfort Insulin Syringe 29g X 1/2" 0.3 Ml Misc (Insulin syringe-needle u-100) .... Use as directed 10)  Lasix 80 Mg Tabs (Furosemide) .... Take 1 by mouth once daily 11)  Duoneb 0.5-2.5 (3) Mg/72ml Soln (Ipratropium-albuterol) .Marland Kitchen.. 1 unit in nebulizer every 6 hours 12)  Combivent 103-18 Mcg/act Aero (Ipratropium-albuterol) .... 2 puffs every 6 hours when not using nebulizer 13)  Omeprazole 20 Mg Cpdr (Omeprazole) .Marland Kitchen.. 1 tab daily for acid reflux  Patient Instructions: 1)  Please schedule a follow-up appointment in 6 months .  Prescriptions: OMEPRAZOLE 20 MG CPDR (OMEPRAZOLE) 1 tab daily for acid reflux  #30 x 12   Entered and Authorized by:   Cindee Salt MD   Signed by:   Cindee Salt MD on 08/06/2009   Method used:   [facility] to        [id number] ([facility]ail[facility]       [address]       [place], [place]  [address]       Ph: [phone] or [phone]       Fax: [phone]   RxID:   [id number]   Current Allergies (reviewed today): * THEO-DUR (THEOPHYLLINE)

## 2010-06-25 NOTE — Assessment & Plan Note (Signed)
Summary: 6 MONTH FOLLOW UP/[facility]H   Vital Signs:  Patient profile:   77 year old male Weight:      202 pounds O2 Sat:      91 % on Room air Temp:     98.3 degrees F oral Pulse rate:   74 / minute Pulse rhythm:   regular Resp:     20 per minute BP sitting:   140 / 90  (left arm) Cuff size:   large  Vitals Entered By: Mervin Hack CMA Duncan Dull) (February 11, 2010 10:51 AM)  O2 Flow:  Room air CC: 6 month follow-up   History of Present Illness: Having pain in RLQ and flank Seem to be seperate Goes back 3-4 weeks and was the reason for his last visit here Little cough--not more than usual No fever Has SOB at times--seems worse than usual Did have appt with Dr Welton Flakes recently--no changes Bowels are okay Appetite is fine No nausea or vomiting  Has pinched nerve on left with pain down leg Saw chiropractor Dr Anner Crete seems some better with Rx there No leg weakness but gets aching pain with weight bearing  checks sugars two times a day still generally under 200 AM around 170 regularly Does get shaky with any below 100  No chest pain Does note fast heart beat with SOB from breathing  Allergies: 1)  * Theo-Dur (Theophylline)  Past History:  Past medical, surgical, family and social histories (including risk factors) reviewed for relevance to current acute and chronic problems.  Past Medical History: Reviewed history from 09/29/2007 and no changes required. Allergic rhinitis Colonic polyps, hx of--adenomatous COPD/pulm fibrosis--------------------------------------------Dr Freda Munro Diabetes mellitus, type II GERD Erectile dysfunction Cardiomyopathy Hyperlipidemia Renal insufficiency  Past Surgical History: Reviewed history from 10/13/2006 and no changes required. CP- ? CAD 08/04 Pneumonia-rehab 2003 Left clavicle fracture- child Left readius/ ulna fracture child Right 1 H x 2 Left 1 H x 1 Right lung bx. negative 2001 Cataracts/ IOL OU, Epes 2005, Pulman ?   2000 Myoview stress negative EF 65-70% 08/04 ABI's normal 01/07  Family History: Reviewed history from 10/13/2006 and no changes required. Father: Died  at age of 39, NIDDM Mother: Died in her 25's, ? heart Siblings: 3 sister, one adopted No cancer  Social History: Reviewed history from 08/06/2009 and no changes required. Marital Status: Widowed Has platonic lady lfriend since  ~2008  Cleda Daub Children: 2 sons, 1 daughter  Retired- Warden/ranger Currently seeing Corrie Dandy  Review of Systems       appetite is good has lost 8# since last visit trying to eat right sleeps okay after initiates---the left leg pain has made it hard to settle  Physical Exam  General:  alert and normal appearance.   Neck:  supple, no masses, no thyromegaly, no carotid bruits, and no cervical lymphadenopathy.   Chest Wall:  no deformities and no tenderness.   Lungs:  normal respiratory effort, no intercostal retractions, no accessory muscle use, and normal breath sounds.   Heart:  normal rate, regular rhythm, no murmur, and no gallop.   Abdomen:  soft, non-tender, no masses, no guarding, and no rigidity.   Msk:  no joint tenderness and no joint swelling.   Pulses:  faint in feet Extremities:  no edema Skin:  no suspicious lesions and no ulcerations.   Psych:  normally interactive, good eye contact, not anxious appearing, and not depressed appearing.    Diabetes Management Exam:    Foot Exam (with socks and/or  shoes not present):       Sensory-Pinprick/Light touch:          Left medial foot (L-4): normal          Left dorsal foot (L-5): normal          Left lateral foot (S-1): normal          Right medial foot (L-4): normal          Right dorsal foot (L-5): normal          Right lateral foot (S-1): normal       Inspection:          Left foot: normal          Right foot: normal       Nails:          Left foot: thickened          Right foot: thickened    Eye Exam:       Eye Exam done  elsewhere          Date: 10/24/2009          Results: No retinopathy reported          Done by: [facility]   Impression & Recommendations:  Problem # 1:  ABDOMINAL PAIN, RIGHT LOWER QUADRANT (ICD-789.03) Assessment New seems to be muscular and perhaps rib (along the flank) reassured will give Rx for pain for this and apparent sciatica on left  Problem # 2:  DIABETES MELLITUS, TYPE II (ICD-250.00) Assessment: Unchanged  still seems to have reasonable control very sensitive to low sugars so goal is <9%  His updated medication list for this problem includes:    Novolin R 100 Unit/ml Soln (Insulin regular human) .Marland KitchenMarland KitchenMarland KitchenMarland Kitchen 10 units two times a day    Novolin N 100 Unit/ml Susp (Insulin isophane human) .Marland Kitchen... 25  units two times a day    Aspirin 81 Mg Tabs (Aspirin) .Marland Kitchen... Take one by mouth daily  Labs Reviewed: Creat: 1.2 (08/06/2009)     Last Eye Exam: No retinopathy reported (10/24/2009) Reviewed HgBA1c results: 7.3 (08/06/2009)  7.7 (03/14/2009)  Orders: TLB-A1C / Hgb A1C (Glycohemoglobin) (83036-A1C) TLB-Renal Function Panel (80069-RENAL) Venipuncture ([address])  Problem # 3:  COPD (ICD-496) Assessment: Unchanged fairly stable status sees Dr Welton Flakes  His updated medication list for this problem includes:    Duoneb 0.5-2.5 (3) Mg/60ml Soln (Ipratropium-albuterol) .Marland Kitchen... 1 unit in nebulizer every 6 hours    Combivent 103-18 Mcg/act Aero (Ipratropium-albuterol) .Marland Kitchen... 2 puffs every 6 hours when not using nebulizer    Advair Diskus 250-50 Mcg/dose Misc (Fluticasone-salmeterol) ..... Use two times a day  Problem # 4:  GERD (ICD-530.81) Assessment: Unchanged okay on the med  His updated medication list for this problem includes:    Omeprazole 20 Mg Cpdr (Omeprazole) .Marland Kitchen... 1 tab daily for acid reflux  Problem # 5:  HYPERLIPIDEMIA (ICD-272.4) Assessment: Unchanged at goal recheck next time  His updated medication list for this problem includes:    Simvastatin 20 Mg Tabs  (Simvastatin) .Marland Kitchen... Take 1 tablet by mouth once a day  Labs Reviewed: SGOT: 18 (08/06/2009)   SGPT: 20 (08/06/2009)   HDL:39.10 (08/06/2009), 39.20 (11/10/2008)  LDL:37 (08/06/2009), 49 (11/10/2008)  Chol:100 (08/06/2009), 113 (11/10/2008)  Trig:120.0 (08/06/2009), 126.0 (11/10/2008)  Complete Medication List: 1)  Temazepam 30 Mg Caps (Temazepam) .... Take 1 capsule by mouth at bedtime 2)  Klor-con M20 20 Meq Tbcr (Potassium chloride crys cr) .... Take one by mouth every day 3)  Novolin R 100 Unit/ml Soln (Insulin regular human) .Marland Kitchen.. 10 units two times a day 4)  Novolin N 100 Unit/ml Susp (Insulin isophane human) .... 25  units two times a day 5)  Simvastatin 20 Mg Tabs (Simvastatin) .... Take 1 tablet by mouth once a day 6)  Lasix 80 Mg Tabs (Furosemide) .... Take 1 by mouth once daily 7)  Duoneb 0.5-2.5 (3) Mg/36ml Soln (Ipratropium-albuterol) .Marland Kitchen.. 1 unit in nebulizer every 6 hours 8)  Combivent 103-18 Mcg/act Aero (Ipratropium-albuterol) .... 2 puffs every 6 hours when not using nebulizer 9)  Omeprazole 20 Mg Cpdr (Omeprazole) .Marland Kitchen.. 1 tab daily for acid reflux 10)  Advair Diskus 250-50 Mcg/dose Misc (Fluticasone-salmeterol) .... Use two times a day 11)  Aspirin 81 Mg Tabs (Aspirin) .... Take one by mouth daily 12)  Oxygen  .... Uses 2 liters 13)  Sure Comfort Insulin Syringe 29g X 1/2" 0.3 Ml Misc (Insulin syringe-needle u-100) .... Use as directed 14)  Hydrocodone-acetaminophen 5-325 Mg Tabs (Hydrocodone-acetaminophen) .Marland Kitchen.. 1 tab by mouth three times a day as needed for pain  Other Orders: Flu Vaccine 62yrs + (95093) Admin 1st Vaccine (26712) Admin 1st Vaccine [facility]) [phone])  Patient Instructions: 1)  Please schedule a follow-up appointment in 6 months .  Prescriptions: HYDROCODONE-ACETAMINOPHEN 5-325 MG TABS (HYDROCODONE-ACETAMINOPHEN) 1 tab by mouth three times a day as needed for pain  #60 x 0   Entered and Authorized by:   Cindee Salt MD   Signed by:   Cindee Salt  MD on 02/11/2010   Method used:   Print then Give to Patient   RxID:   [id number]   Current Allergies (reviewed today): * THEO-DUR (THEOPHYLLINE)    Influenza Vaccine    Vaccine Type: Fluvax 3+    Site: left deltoid    Mfr: GlaxoSmithKline    Dose: 0.5 ml    Route: IM    Given by: Mervin Hack CMA (AAMA)    Exp. Date: 11/23/2010    Lot #: [id number]    VIS given: 12/17/06 version given February 11, 2010.  Flu Vaccine Consent Questions    Do you have a history of severe allergic reactions to this vaccine? no    Any prior history of allergic reactions to egg and/or gelatin? no    Do you have a sensitivity to the preservative Thimersol? no    Do you have a past history of Guillan-Barre Syndrome? no    Do you currently have an acute febrile illness? no    Have you ever had a severe reaction to latex? no    Vaccine information given and explained to patient? yes

## 2010-06-25 NOTE — Consult Note (Signed)
Summary: [facility]  [facility]   Imported By: Lanelle Bal 06/14/2009 13:13:16  _____________________________________________________________________  External Attachment:    Type:   Image     Comment:   External Document  Appended Document: [facility] changing advair to symbicort

## 2010-06-25 NOTE — Assessment & Plan Note (Signed)
Summary: S.O.B.,RIGHT SIDE PAIN X 2WKS/CLE   Vital Signs:  Patient profile:   77 year old male Height:      67.50 inches Weight:      210.25 pounds BMI:     32.56 O2 Sat:      89 % on Room air Temp:     97.9 degrees F oral Pulse rate:   80 / minute Pulse rhythm:   regular BP sitting:   140 / 90  (left arm) Cuff size:   regular  Vitals Entered By: Linde Gillis CMA Duncan Dull) (January 09, 2010 9:05 AM)  O2 Flow:  Room air CC: cough, righ sided pain   History of Present Illness: 77 yo with h/o COPD here for cough, right sided pain. Supposed to be on O2 2 L but left his Oxygen in the car. Does not feel more SOB than usual. Always coughs but feels like it has been more productive for past two week and right side is hurting when he coughs or takes deep breaths. No chest pain, wheezing, fevers or chills.  Current Medications (verified): 1)  Temazepam 30 Mg Caps (Temazepam) .... Take 1 Capsule By Mouth At Bedtime 2)  Klor-Con M20 20 Meq Tbcr (Potassium Chloride Crys Cr) .... Take One By Mouth Every Day 3)  Aspirin 81 Mg Tabs (Aspirin) .... Take One By Mouth Daily 4)  Advair Diskus 250-50 Mcg/dose Misc (Fluticasone-Salmeterol) .... Use Two Times A Day 5)  Oxygen .... Use At Bedtime 6)  Novolin R 100 Unit/ml Soln (Insulin Regular Human) .Marland Kitchen.. 10 Units Two Times A Day 7)  Novolin N 100 Unit/ml Susp (Insulin Isophane Human) .... 25  Units Two Times A Day 8)  Simvastatin 20 Mg  Tabs (Simvastatin) .... Take 1 Tablet By Mouth Once A Day 9)  Sure Comfort Insulin Syringe 29g X 1/2" 0.3 Ml Misc (Insulin Syringe-Needle U-100) .... Use As Directed 10)  Lasix 80 Mg Tabs (Furosemide) .... Take 1 By Mouth Once Daily 11)  Duoneb 0.5-2.5 (3) Mg/36ml Soln (Ipratropium-Albuterol) .Marland Kitchen.. 1 Unit in Nebulizer Every 6 Hours 12)  Combivent 103-18 Mcg/act Aero (Ipratropium-Albuterol) .... 2 Puffs Every 6 Hours When Not Using Nebulizer 13)  Omeprazole 20 Mg Cpdr (Omeprazole) .Marland Kitchen.. 1 Tab Daily For Acid Reflux 14)   Azithromycin 250 Mg  Tabs (Azithromycin) .... 2 By  Mouth Today and Then 1 Daily For 4 Days 15)  Tramadol Hcl 50 Mg  Tabs (Tramadol Hcl) .Marland Kitchen.. 1 Tab By Mouth Two Times A Day As Needed Pain  Allergies: 1)  * Theo-Dur (Theophylline)  Past History:  Past Medical History: Last updated: 09/29/2007 Allergic rhinitis Colonic polyps, hx of--adenomatous COPD/pulm fibrosis--------------------------------------------Dr Freda Munro Diabetes mellitus, type II GERD Erectile dysfunction Cardiomyopathy Hyperlipidemia Renal insufficiency  Past Surgical History: Last updated: 10-15-06 CP- ? CAD 08/04 Pneumonia-rehab 2003 Left clavicle fracture- child Left readius/ ulna fracture child Right 1 H x 2 Left 1 H x 1 Right lung bx. negative 2001 Cataracts/ IOL OU, Epes 2005, Pulman ?  2000 Myoview stress negative EF 65-70% 08/04 ABI's normal 01/07  Family History: Last updated: 2006/10/15 Father: Died  at age of 29, NIDDM Mother: Died in her 36's, ? heart Siblings: 3 sister, one adopted No cancer  Social History: Last updated: 08/06/2009 Marital Status: Widowed Has platonic lady lfriend since  ~2008  Cleda Daub Children: 2 sons, 1 daughter  Retired- Warden/ranger Currently seeing Mary  Risk Factors: Smoking Status: quit (Oct 15, 2006)  Review of Systems      See  HPI General:  Denies chills and fever. CV:  Denies chest pain or discomfort. Resp:  Complains of cough and sputum productive; denies shortness of breath and wheezing.  Physical Exam  General:  alert and normal appearance.   Chest Wall:  TTP over right inferior rib cage. Lungs:  normal respiratory effort, decreased BS at RLLB along with some faint crackles, no wheezes, left lung clear.   Heart:  normal rate, regular rhythm, no murmur, and no gallop.   Psych:  normally interactive, good eye contact, not anxious appearing, and not depressed appearing.     Impression & Recommendations:  Problem # 1:  COUGH  (ICD-786.2) Assessment Deteriorated In an O2 dependent COPD pt with new possible LLL pneumonia and rib fracture. Will get CXR, treat with Zpack and Tramadol for pain. Strongly advised using O2 all the time as it was prescribed. Orders: T-2 View CXR (71020TC) Prescription Created Electronically [phone])  Complete Medication List: 1)  Temazepam 30 Mg Caps (Temazepam) .... Take 1 capsule by mouth at bedtime 2)  Klor-con M20 20 Meq Tbcr (Potassium chloride crys cr) .... Take one by mouth every day 3)  Aspirin 81 Mg Tabs (Aspirin) .... Take one by mouth daily 4)  Advair Diskus 250-50 Mcg/dose Misc (Fluticasone-salmeterol) .... Use two times a day 5)  Oxygen  .... Use at bedtime 6)  Novolin R 100 Unit/ml Soln (Insulin regular human) .Marland Kitchen.. 10 units two times a day 7)  Novolin N 100 Unit/ml Susp (Insulin isophane human) .... 25  units two times a day 8)  Simvastatin 20 Mg Tabs (Simvastatin) .... Take 1 tablet by mouth once a day 9)  Sure Comfort Insulin Syringe 29g X 1/2" 0.3 Ml Misc (Insulin syringe-needle u-100) .... Use as directed 10)  Lasix 80 Mg Tabs (Furosemide) .... Take 1 by mouth once daily 11)  Duoneb 0.5-2.5 (3) Mg/9ml Soln (Ipratropium-albuterol) .Marland Kitchen.. 1 unit in nebulizer every 6 hours 12)  Combivent 103-18 Mcg/act Aero (Ipratropium-albuterol) .... 2 puffs every 6 hours when not using nebulizer 13)  Omeprazole 20 Mg Cpdr (Omeprazole) .Marland Kitchen.. 1 tab daily for acid reflux 14)  Azithromycin 250 Mg Tabs (Azithromycin) .... 2 by  mouth today and then 1 daily for 4 days 15)  Tramadol Hcl 50 Mg Tabs (Tramadol hcl) .Marland Kitchen.. 1 tab by mouth two times a day as needed pain Prescriptions: TRAMADOL HCL 50 MG  TABS (TRAMADOL HCL) 1 tab by mouth two times a day as needed pain  #60 x 0   Entered and Authorized by:   Ruthe Mannan MD   Signed by:   Ruthe Mannan MD on 01/09/2010   Method used:   [id number]       [address]       [place], [place]  [address]       Ph: [phone] or [phone]       Fax: [phone]   RxID:   [id number] AZITHROMYCIN 250 MG  TABS (AZITHROMYCIN) 2 by  mouth today and then 1 daily for 4 days  #6 x 0   Entered and Authorized by:   Ruthe Mannan MD   Signed by:   Ruthe Mannan MD on 01/09/2010   Method used:   [id number]       [address]       [place], [place]  [address]       Ph: [phone] or [phone]       Fax: [phone]   RxID:   [id number]   Current Allergies (reviewed today): * THEO-DUR (THEOPHYLLINE)

## 2010-06-26 ENCOUNTER — Encounter: Payer: Self-pay | Admitting: Internal Medicine

## 2010-06-27 NOTE — Consult Note (Signed)
Summary: [facility]  [facility]   Imported By: Maryln Gottron 06/06/2010 15:46:44  _____________________________________________________________________  External Attachment:    Type:   Image     Comment:   External Document  Appended Document: [facility] concerned about HNP trying prednisone checking MRI

## 2010-06-27 NOTE — Letter (Signed)
Summary: [facility]   Imported By: Lester [place] 05/17/2010 09:22:42  _____________________________________________________________________  External Attachment:    Type:   Image     Comment:   External Document  Appended Document: [facility] continuing oxygen no changes in inhalers

## 2010-06-27 NOTE — Progress Notes (Signed)
Summary: hydrocodone homatropine  Phone Note Refill Request Message from:  Fax from Pharmacy on May 21, 2010 9:59 AM  Refills Requested: Medication #1:  hydrocodone-homatropine syrup   Supply Requested: 1 month   [facility]: [facility] [address]   Method Requested: Telephone to Pharmacy Initial call taken by: Benny Lennert CMA Duncan Dull),  May 21, 2010 10:00 AM  Follow-up for Phone Call        please check and see what is going on with him and whether he requested the refill Cindee Salt MD  May 22, 2010 7:53 AM   left message on machine at home for patient to return my call.  DeShannon Smith CMA Duncan Dull)  May 22, 2010 9:12 AM   patient has appt today at 11:00 am  Follow-up by: Mervin Hack CMA Duncan Dull),  May 22, 2010 9:49 AM

## 2010-06-27 NOTE — Assessment & Plan Note (Signed)
Summary: PAIN IN LEFT LEG/ lb   Vital Signs:  Patient profile:   77 year old male Weight:      197 pounds O2 Sat:      95 % on 2 L/min Temp:     97.9 degrees F oral Pulse rate:   90 / minute Pulse rhythm:   regular BP sitting:   141 / 84  (left arm) Cuff size:   large  Vitals Entered By: Mervin Hack CMA Duncan Dull) (May 22, 2010 11:15 AM)  O2 Flow:  2 L/min CC: pain in left hip   History of Present Illness: having left leg pain "gives out" constant pain all the way down outside of leg--starts in back and also in hip He thinks the pain might be related to the hip  Notes that he had pinched nerve in back Got adjustment from Dr Anner Crete -- every 3 day visits starting 2 months ago would get temporary improvement in back but not leg  No meds  Leg is weak at times Needs to use walker now ---has fallen several times when he didn't have the walker  Left leg seems smaller than the right ?atrophy (per Dr Anner Crete)  Not able to get in and out of shower needs shower chair  Allergies: 1)  * Theo-Dur (Theophylline)  Past History:  Past medical, surgical, family and social histories (including risk factors) reviewed for relevance to current acute and chronic problems.  Past Medical History: Reviewed history from 04/09/2010 and no changes required. Allergic rhinitis Colonic polyps, hx of--adenomatous COPD/pulm fibrosis--------------------------------------------Dr Freda Munro Diabetes mellitus, type II GERD Erectile dysfunction Cardiomyopathy Hyperlipidemia Renal insufficiency Osteoarthritis  Past Surgical History: Reviewed history from 10/13/2006 and no changes required. CP- ? CAD 08/04 Pneumonia-rehab 2003 Left clavicle fracture- child Left readius/ ulna fracture child Right 1 H x 2 Left 1 H x 1 Right lung bx. negative 2001 Cataracts/ IOL OU, Epes 2005, Pulman ?  2000 Myoview stress negative EF 65-70% 08/04 ABI's normal 01/07  Family History: Reviewed  history from 10/13/2006 and no changes required. Father: Died  at age of 49, NIDDM Mother: Died in her 51's, ? heart Siblings: 3 sister, one adopted No cancer  Social History: Reviewed history from 08/06/2009 and no changes required. Marital Status: Widowed Has platonic lady lfriend since  ~2008  Cleda Daub Children: 2 sons, 1 daughter  Retired- Warden/ranger Currently seeing Corrie Dandy  Review of Systems       Having trouble with constipation Colace no help No change in urinary habits  Physical Exam  General:  alert.  NAD Msk:  localized left back pain posterior to hip No spine tenderness some pain in left leg with SLR Limted internal rotation of left hip--recreates leg pain (as does the SLR) Pulses:  palpable in left foot Neurologic:  antalgic gait--drags left foot focal weakness in all left leg muscle groups--esp flexion at hip   Impression & Recommendations:  Problem # 1:  LEG PAIN, LEFT (ICD-729.5) Assessment New  has had radicular back pain but this seems different leg pain doesn't seem to be vascular May be from back or hip will Rx tramadol  Ortho referral  Orders: Orthopedic Surgeon Referral (Ortho Surgeon)  Problem # 2:  COPD (ICD-496) Assessment: Unchanged O2 dependent severe limitation will use the tramadol as needed for cough  His updated medication list for this problem includes:    Duoneb 0.5-2.5 (3) Mg/64ml Soln (Ipratropium-albuterol) .Marland Kitchen... 1 unit in nebulizer every 6 hours    Combivent 103-18  Mcg/act Aero (Ipratropium-albuterol) .Marland Kitchen... 2 puffs every 6 hours when not using nebulizer    Advair Diskus 250-50 Mcg/dose Misc (Fluticasone-salmeterol) ..... Use two times a day  Complete Medication List: 1)  Temazepam 30 Mg Caps (Temazepam) .... Take 1 capsule by mouth at bedtime 2)  Klor-con M20 20 Meq Tbcr (Potassium chloride crys cr) .... Take one by mouth every day 3)  Novolin R 100 Unit/ml Soln (Insulin regular human) .Marland Kitchen.. 10 units two times a  day 4)  Novolin N 100 Unit/ml Susp (Insulin isophane human) .... 25  units two times a day 5)  Simvastatin 20 Mg Tabs (Simvastatin) .... Take 1 tablet by mouth once a day 6)  Lasix 80 Mg Tabs (Furosemide) .... Take 1 by mouth once daily 7)  Duoneb 0.5-2.5 (3) Mg/5ml Soln (Ipratropium-albuterol) .Marland Kitchen.. 1 unit in nebulizer every 6 hours 8)  Combivent 103-18 Mcg/act Aero (Ipratropium-albuterol) .... 2 puffs every 6 hours when not using nebulizer 9)  Omeprazole 20 Mg Cpdr (Omeprazole) .Marland Kitchen.. 1 tab daily for acid reflux 10)  Advair Diskus 250-50 Mcg/dose Misc (Fluticasone-salmeterol) .... Use two times a day 11)  Aspirin 81 Mg Tabs (Aspirin) .... Take one by mouth daily 12)  Oxygen  .... Uses 2 liters 13)  Sure Comfort Insulin Syringe 29g X 1/2" 0.3 Ml Misc (Insulin syringe-needle u-100) .... Use as directed 14)  Tramadol Hcl 50 Mg Tabs (Tramadol hcl) .... Take one by mouth up to four times daily for pain or cough  Patient Instructions: 1)  Please try miralax (polyethylene glycol) 17gm daily with water to help bowels 2)  Please keep your March appt here 3)  Referral Appointment Information 4)  Day/Date: 5)  Time: 6)  Place/MD: 7)  Address: 8)  Phone/Fax: 9)  Patient given appointment information. Information/Orders faxed/mailed. Prescriptions: TRAMADOL HCL 50 MG TABS (TRAMADOL HCL) take one by mouth up to four times daily for pain or cough  #120 x 0   Entered and Authorized by:   Cindee Salt MD   Signed by:   Cindee Salt MD on 05/22/2010   Method used:   [facility] to        [id number]       [address]       [place], [place]  [address]       Ph: [phone] or [phone]       Fax: [phone]   RxID:   [id number]    Orders Added: 1)  Est. Patient Level IV [18841] 2)  Orthopedic Surgeon Referral Gaylord Shih Surgeon]    Current Allergies (reviewed today): * THEO-DUR (THEOPHYLLINE)

## 2010-07-11 NOTE — Consult Note (Signed)
Summary: [facility]  [facility]   Imported By: Kassie Mends 07/01/2010 10:28:40  _____________________________________________________________________  External Attachment:    Type:   Image     Comment:   External Document

## 2010-07-17 ENCOUNTER — Encounter: Payer: Self-pay | Admitting: Family Medicine

## 2010-07-17 ENCOUNTER — Ambulatory Visit (INDEPENDENT_AMBULATORY_CARE_PROVIDER_SITE_OTHER): Payer: Medicare Other | Admitting: Family Medicine

## 2010-07-17 DIAGNOSIS — H6120 Impacted cerumen, unspecified ear: Secondary | ICD-10-CM | POA: Insufficient documentation

## 2010-07-17 DIAGNOSIS — R609 Edema, unspecified: Secondary | ICD-10-CM

## 2010-07-19 ENCOUNTER — Telehealth: Payer: Self-pay | Admitting: Internal Medicine

## 2010-07-23 NOTE — Assessment & Plan Note (Signed)
Summary: LEFT FOOT IS SWOLLEN   Vital Signs:  Patient profile:   77 year old male Weight:      199.25 pounds Temp:     98.4 degrees F oral Pulse rate:   84 / minute Pulse rhythm:   regular BP sitting:   120 / 70  (left arm) Cuff size:   large  Vitals Entered By: Sydell Axon LPN (July 17, 2010 1:59 PM) CC: Left foot swollen and wax in left ear   History of Present Illness: Pt here for swelling of dorsal surface of left foot. He noticed it last night. He doesn't think this has bothered him previously. He is on lasix, longstanding. He has recently started Gabapentin for pain and Tramadol. He denies pain in the foot or leg other than what has been going on for the last few months. HE has seen an ortho at [facility] and was given back injection and put on oral steroids as well. This has helped slightly. He also c/o wax in the left ear. He uses Qtips which I tried to disabuse him of.  Problems Prior to Update: 1)  Leg Pain, Left  (ICD-729.5) 2)  Osteoarthritis  (ICD-715.90) 3)  Chest Pain, Right  (ICD-786.50) 4)  Abdominal Pain, Right Lower Quadrant  (ICD-789.03) 5)  Cough  (ICD-786.2) 6)  Back Pain, Lumbar, With Radiculopathy  (ICD-724.4) 7)  Rash and Other Nonspecific Skin Eruption  (ICD-782.1) 8)  Renal Insufficiency  (ICD-588.9) 9)  Hyperlipidemia  (ICD-272.4) 10)  Cardiomyopathy  (ICD-425.4) 11)  Erectile Dysfunction  (ICD-302.72) 12)  Gerd  (ICD-530.81) 13)  Diabetes Mellitus, Type II  (ICD-250.00) 14)  COPD  (ICD-496) 15)  Colonic Polyps, Hx of (ADENOMATORES)  (ICD-V12.72) 16)  Allergic Rhinitis  (ICD-477.9)  Medications Prior to Update: 1)  Temazepam 30 Mg Caps (Temazepam) .... Take 1 Capsule By Mouth At Bedtime 2)  Klor-Con M20 20 Meq Tbcr (Potassium Chloride Crys Cr) .... Take One By Mouth Every Day 3)  Novolin R 100 Unit/ml Soln (Insulin Regular Human) .Marland Kitchen.. 10 Units Two Times A Day 4)  Novolin N 100 Unit/ml Susp (Insulin Isophane Human) .... 25  Units Two Times A Day 5)   Simvastatin 20 Mg  Tabs (Simvastatin) .... Take 1 Tablet By Mouth Once A Day 6)  Lasix 80 Mg Tabs (Furosemide) .... Take 1 By Mouth Once Daily 7)  Duoneb 0.5-2.5 (3) Mg/23ml Soln (Ipratropium-Albuterol) .Marland Kitchen.. 1 Unit in Nebulizer Every 6 Hours 8)  Combivent 103-18 Mcg/act Aero (Ipratropium-Albuterol) .... 2 Puffs Every 6 Hours When Not Using Nebulizer 9)  Omeprazole 20 Mg Cpdr (Omeprazole) .Marland Kitchen.. 1 Tab Daily For Acid Reflux 10)  Advair Diskus 250-50 Mcg/dose Misc (Fluticasone-Salmeterol) .... Use Two Times A Day 11)  Aspirin 81 Mg Tabs (Aspirin) .... Take One By Mouth Daily 12)  Oxygen .... Uses 2 Liters 13)  Sure Comfort Insulin Syringe 29g X 1/2" 0.3 Ml Misc (Insulin Syringe-Needle U-100) .... Use As Directed 14)  Tramadol Hcl 50 Mg Tabs (Tramadol Hcl) .... Take One By Mouth Up To Four Times Daily For Pain or Cough  Current Medications (verified): 1)  Temazepam 30 Mg Caps (Temazepam) .... Take 1 Capsule By Mouth At Bedtime 2)  Klor-Con M20 20 Meq Tbcr (Potassium Chloride Crys Cr) .... Take One By Mouth Every Day 3)  Novolin R 100 Unit/ml Soln (Insulin Regular Human) .Marland Kitchen.. 10 Units Two Times A Day 4)  Novolin N 100 Unit/ml Susp (Insulin Isophane Human) .... 25  Units Two Times A Day 5)  Simvastatin 20 Mg  Tabs (Simvastatin) .... Take 1 Tablet By Mouth Once A Day 6)  Lasix 80 Mg Tabs (Furosemide) .... Take 1 By Mouth Once Daily 7)  Duoneb 0.5-2.5 (3) Mg/62ml Soln (Ipratropium-Albuterol) .Marland Kitchen.. 1 Unit in Nebulizer Every 6 Hours 8)  Combivent 103-18 Mcg/act Aero (Ipratropium-Albuterol) .... 2 Puffs Every 6 Hours When Not Using Nebulizer 9)  Omeprazole 20 Mg Cpdr (Omeprazole) .Marland Kitchen.. 1 Tab Daily For Acid Reflux 10)  Advair Diskus 250-50 Mcg/dose Misc (Fluticasone-Salmeterol) .... Use Two Times A Day 11)  Aspirin 81 Mg Tabs (Aspirin) .... Take One By Mouth Daily 12)  Oxygen .... Uses 2 Liters 13)  Sure Comfort Insulin Syringe 29g X 1/2" 0.3 Ml Misc (Insulin Syringe-Needle U-100) .... Use As Directed 14)   Tramadol Hcl 50 Mg Tabs (Tramadol Hcl) .... Take One By Mouth Up To Four Times Daily For Pain or Cough  Allergies: 1)  * Theo-Dur (Theophylline)  Physical Exam  General:  alert.  NAD Head:  Normocephalic and atraumatic without obvious abnormalities. No apparent alopecia or balding. Sinuses nontender. Eyes:  Conjunctiva clear bilaterally.  Ears:  R ear normal and L occluded with wax significantly down in the canal.   Extremities:  Right foot nml. Left foot slightly swollen and nonerytherm over the dorsal surface of the left foot at the arch area. Sensation nml. Pulses diff to appreciate bilat, both DP and PT. skin warm and dry. Homan's negative. Monofilament nml bilat.    Impression & Recommendations:  Problem # 1:  L PEDAL EDEMA (ICD-782.3) Assessment New  Presumed due to Gabapentin as it is listed as common side effect. Stop and follow. RTC if sxs persist. May cont Tramadol as needed. His updated medication list for this problem includes:    Lasix 80 Mg Tabs (Furosemide) .Marland Kitchen... Take 1 by mouth once daily  Discussed elevation of the legs, use of compression stockings, sodium restiction, and medication use.   Problem # 2:  CERUMEN IMPACTION, LEFT (ICD-380.4) Assessment: New  Irrigated today. Clean after multiple irrigations. Discussed self irrigation.  Orders: Cerumen Impaction Removal ([address])  Complete Medication List: 1)  Temazepam 30 Mg Caps (Temazepam) .... Take 1 capsule by mouth at bedtime 2)  Klor-con M20 20 Meq Tbcr (Potassium chloride crys cr) .... Take one by mouth every day 3)  Novolin R 100 Unit/ml Soln (Insulin regular human) .Marland Kitchen.. 10 units two times a day 4)  Novolin N 100 Unit/ml Susp (Insulin isophane human) .... 25  units two times a day 5)  Simvastatin 20 Mg Tabs (Simvastatin) .... Take 1 tablet by mouth once a day 6)  Lasix 80 Mg Tabs (Furosemide) .... Take 1 by mouth once daily 7)  Duoneb 0.5-2.5 (3) Mg/69ml Soln (Ipratropium-albuterol) .Marland Kitchen.. 1 unit in  nebulizer every 6 hours 8)  Combivent 103-18 Mcg/act Aero (Ipratropium-albuterol) .... 2 puffs every 6 hours when not using nebulizer 9)  Omeprazole 20 Mg Cpdr (Omeprazole) .Marland Kitchen.. 1 tab daily for acid reflux 10)  Advair Diskus 250-50 Mcg/dose Misc (Fluticasone-salmeterol) .... Use two times a day 11)  Aspirin 81 Mg Tabs (Aspirin) .... Take one by mouth daily 12)  Oxygen  .... Uses 2 liters 13)  Sure Comfort Insulin Syringe 29g X 1/2" 0.3 Ml Misc (Insulin syringe-needle u-100) .... Use as directed 14)  Tramadol Hcl 50 Mg Tabs (Tramadol hcl) .... Take one by mouth up to four times daily for pain or cough  Patient Instructions: 1)  RTC if sxs don't improve.   Orders Added: 1)  Est. Patient Level III [16109] 2)  Cerumen Impaction Removal [60454]    Current Allergies (reviewed today): * THEO-DUR (THEOPHYLLINE)

## 2010-08-01 NOTE — Progress Notes (Signed)
Summary: asking for medication  Phone Note Call from Patient Call back at Home Phone [phone]   Caller: Patient Call For: Cindee Salt MD Summary of Call: MESSAGE ON VOICEMAIL  patient calling asking for medication for cough, pt states he coughs all night. Initial call taken by: Mervin Hack CMA Duncan Dull),  July 19, 2010 3:57 PM  Follow-up for Phone Call        He has the tramadol that can be used for cough If he is out, okay to refill #60 x 0 Follow-up by: Cindee Salt MD,  July 19, 2010 4:49 PM  Additional Follow-up for Phone Call Additional follow up Details #1::        left message on home voicemail with results, I asked the pt to call if he needs the refill. DeShannon Smith CMA Duncan Dull)  July 19, 2010 4:51 PM   left message on machine at home for patient to return my call again, pt did not call asking for refill. Additional Follow-up by: Mervin Hack CMA Duncan Dull),  July 22, 2010 4:19 PM    New/Updated Medications: TRAMADOL HCL 50 MG TABS (TRAMADOL HCL) take one by mouth up to four times daily for pain or cough

## 2010-08-07 ENCOUNTER — Encounter: Payer: Self-pay | Admitting: Family Medicine

## 2010-08-07 ENCOUNTER — Ambulatory Visit (INDEPENDENT_AMBULATORY_CARE_PROVIDER_SITE_OTHER): Payer: Medicare Other | Admitting: Family Medicine

## 2010-08-07 ENCOUNTER — Telehealth: Payer: Self-pay | Admitting: Internal Medicine

## 2010-08-07 ENCOUNTER — Telehealth: Payer: Self-pay | Admitting: Family Medicine

## 2010-08-07 DIAGNOSIS — J449 Chronic obstructive pulmonary disease, unspecified: Secondary | ICD-10-CM

## 2010-08-07 DIAGNOSIS — R05 Cough: Secondary | ICD-10-CM

## 2010-08-09 ENCOUNTER — Telehealth: Payer: Self-pay | Admitting: Internal Medicine

## 2010-08-12 ENCOUNTER — Ambulatory Visit: Payer: Medicare Other | Admitting: Internal Medicine

## 2010-08-12 DIAGNOSIS — Z0289 Encounter for other administrative examinations: Secondary | ICD-10-CM

## 2010-08-13 NOTE — Assessment & Plan Note (Signed)
Summary: cough and congestion/alc   Vital Signs:  Patient profile:   77 year old male Weight:      201 pounds O2 Sat:      94 % on 2 L/min Temp:     98.5 degrees F oral Pulse rate:   90 / minute Pulse rhythm:   regular BP sitting:   118 / 78  (left arm) Cuff size:   large  Vitals Entered By: Selena Batten Dance CMA (AAMA) (August 07, 2010 10:53 AM)  O2 Flow:  2 L/min CC: Cough and congestion   History of Present Illness: CC: cough/congestion  1 wk h/o respiratory symptoms.  since friday.  wheezing since last week.  actually feeling some better compared to last week.  + more sputum and SOB than normal.  + nasal congestion  no fevers.  not able to get sputum up.  No chest pain.  No abd pain, n/v/d, rashes.  No ST, HA.    has cough syrup at home.  using cough drops as well.  "I need abx".    On 2 L O2 at baseline.  recently went to [facility] (2/24) for cough because states "couldn't get through here", given cough syrup and some antibiotic, unsure what it was.  finished course.  Current Medications (verified): 1)  Temazepam 30 Mg Caps (Temazepam) .... Take 1 Capsule By Mouth At Bedtime 2)  Klor-Con M20 20 Meq Tbcr (Potassium Chloride Crys Cr) .... Take One By Mouth Every Day 3)  Novolin R 100 Unit/ml Soln (Insulin Regular Human) .Marland Kitchen.. 10 Units Two Times A Day 4)  Novolin N 100 Unit/ml Susp (Insulin Isophane Human) .... 25  Units Two Times A Day 5)  Simvastatin 20 Mg  Tabs (Simvastatin) .... Take 1 Tablet By Mouth Once A Day 6)  Lasix 80 Mg Tabs (Furosemide) .... Take 1 By Mouth Once Daily 7)  Duoneb 0.5-2.5 (3) Mg/92ml Soln (Ipratropium-Albuterol) .Marland Kitchen.. 1 Unit in Nebulizer Every 6 Hours 8)  Combivent 103-18 Mcg/act Aero (Ipratropium-Albuterol) .... 2 Puffs Every 6 Hours When Not Using Nebulizer 9)  Omeprazole 20 Mg Cpdr (Omeprazole) .Marland Kitchen.. 1 Tab Daily For Acid Reflux 10)  Advair Diskus 250-50 Mcg/dose Misc (Fluticasone-Salmeterol) .... Use Two Times A Day 11)  Aspirin 81 Mg Tabs (Aspirin) .... Take  One By Mouth Daily 12)  Oxygen .... Uses 2 Liters 13)  Sure Comfort Insulin Syringe 29g X 1/2" 0.3 Ml Misc (Insulin Syringe-Needle U-100) .... Use As Directed 14)  Tramadol Hcl 50 Mg Tabs (Tramadol Hcl) .... Take One By Mouth Up To Four Times Daily For Pain or Cough  Allergies: 1)  * Theo-Dur (Theophylline)  Past History:  Past Medical History: Last updated: 04/09/2010 Allergic rhinitis Colonic polyps, hx of--adenomatous COPD/pulm fibrosis--------------------------------------------Dr Freda Munro Diabetes mellitus, type II GERD Erectile dysfunction Cardiomyopathy Hyperlipidemia Renal insufficiency Osteoarthritis  Social History: Last updated: 08/06/2009 Marital Status: Widowed Has platonic lady lfriend since  ~2008  Cleda Daub Children: 2 sons, 1 daughter  Retired- Warden/ranger Currently seeing Corrie Dandy  Review of Systems       per HPI  Physical Exam  General:  alert.  NAD,  Head:  Normocephalic and atraumatic without obvious abnormalities. No apparent alopecia or balding. Sinuses nontender. Eyes:  + watery and slightly injected conjunctiva bilaterally Ears:  TMs clear bilaterally, canals clear.  Nose:  mild inflammation Mouth:  no erythema and no exudates.   MMM Neck:  supple, no masses, and no cervical lymphadenopathy.   Lungs:  normal respiratory effort, no intercostal  retractions, and no accessory muscle use.  distant lung sounds but no wheezing appreciated.   Mild bibasilar fine crackles Heart:  normal rate, regular rhythm, no murmur, and no gallop.   Pulses:  2+ rad pulses   Impression & Recommendations:  Problem # 1:  COUGH (ICD-786.2) Assessment New with SOB, likely COPD flare in setting of viral illness.  lungs actually sounding clear today, no wheezing, no evidence of infection.  anticipate could do well off abx, steroids.  O2 sat stable at 94% 2LNC. if deteriorating, script to fill levaquin. if not improving, or red flags, return to be  seen.  Problem # 2:  COPD (ICD-496) encouraged to take advair daily, combivent daily, and use O2 daily.  His updated medication list for this problem includes:    Duoneb 0.5-2.5 (3) Mg/41ml Soln (Ipratropium-albuterol) .Marland Kitchen... 1 unit in nebulizer every 6 hours    Combivent 103-18 Mcg/act Aero (Ipratropium-albuterol) .Marland Kitchen... 2 puffs every 6 hours when not using nebulizer    Advair Diskus 250-50 Mcg/dose Misc (Fluticasone-salmeterol) ..... Use two times a day  Pulmonary Functions Reviewed: O2 sat: 94 (08/07/2010)     Vaccines Reviewed: Pneumovax: Pneumovax (05/28/2003)   Flu Vax: Fluvax 3+ (02/11/2010)  Complete Medication List: 1)  Temazepam 30 Mg Caps (Temazepam) .... Take 1 capsule by mouth at bedtime 2)  Klor-con M20 20 Meq Tbcr (Potassium chloride crys cr) .... Take one by mouth every day 3)  Novolin R 100 Unit/ml Soln (Insulin regular human) .Marland Kitchen.. 10 units two times a day 4)  Novolin N 100 Unit/ml Susp (Insulin isophane human) .... 25  units two times a day 5)  Simvastatin 20 Mg Tabs (Simvastatin) .... Take 1 tablet by mouth once a day 6)  Lasix 80 Mg Tabs (Furosemide) .... Take 1 by mouth once daily 7)  Duoneb 0.5-2.5 (3) Mg/8ml Soln (Ipratropium-albuterol) .Marland Kitchen.. 1 unit in nebulizer every 6 hours 8)  Combivent 103-18 Mcg/act Aero (Ipratropium-albuterol) .... 2 puffs every 6 hours when not using nebulizer 9)  Omeprazole 20 Mg Cpdr (Omeprazole) .Marland Kitchen.. 1 tab daily for acid reflux 10)  Advair Diskus 250-50 Mcg/dose Misc (Fluticasone-salmeterol) .... Use two times a day 11)  Aspirin 81 Mg Tabs (Aspirin) .... Take one by mouth daily 12)  Oxygen  .... Uses 2 liters 13)  Sure Comfort Insulin Syringe 29g X 1/2" 0.3 Ml Misc (Insulin syringe-needle u-100) .... Use as directed 14)  Tramadol Hcl 50 Mg Tabs (Tramadol hcl) .... Take one by mouth up to four times daily for pain or cough 15)  Levaquin 500 Mg Tabs (Levofloxacin) .... Take one daily for 5 days  Patient Instructions: 1)  I think you have  viral infection causing flare of COPD. 2)  Lungs sound clear today. 3)  may use tramadol OR cough syrup for cough, not both. 4)  Continue daily advair, oxygen use. 5)  Antibiotic script to hold on to in case not improving as expected. 6)  ensure you are taking combivent every 6 hours at least for next few days. 7)  Use guaifenesin or simple mucinex with plenty of fluid to help mobilize mucous out. 8)  If fever >101.5, worsening cough or shortness of breath, please return to be seen. Prescriptions: TRAMADOL HCL 50 MG TABS (TRAMADOL HCL) take one by mouth up to four times daily for pain or cough  #60 x 0   Entered and Authorized by:   Eustaquio Boyden  MD   Signed by:   Eustaquio Boyden  MD on 08/07/2010  Method used:   [facility]        [id number]       [address]       [place], [place]  [address]       Ph: [phone] or [phone]       Fax: [phone]   RxID:   [id number] LEVAQUIN 500 MG TABS (LEVOFLOXACIN) take one daily for 5 days  #5 x 0   Entered and Authorized by:   Eustaquio Boyden  MD   Signed by:   Eustaquio Boyden  MD on 08/07/2010   Method used:   Print then Give to Patient   RxID:   [id number]    Orders Added: 1)  Est. Patient Level IV [87564]    Current Allergies (reviewed today): * THEO-DUR (THEOPHYLLINE)

## 2010-08-13 NOTE — Progress Notes (Signed)
  [name]  MD  August 07, 2010 1:03 PM  ---- Converted from flag ---- ---- 08/07/2010 12:50 PM, Lowella Petties CMA, AAMA wrote: Pt called to let you know that he was taking amox 500 mg's twice a day- finished this 2 wks ago. ------------------------------

## 2010-08-13 NOTE — Progress Notes (Signed)
Summary: documentation needed for diabetic supplies  Phone Note From Pharmacy   Caller: [employer] Summary of Call: Form requesting documentation for diabetic supplies is on your desk.            Lowella Petties CMA, AAMA  August 07, 2010 1:01 PM   Follow-up for Phone Call        see my note on the form we have not gotten routine CMN from then other than fraudulent requests for vacuum erectin device check with patient Follow-up by: Cindee Salt MD,  August 07, 2010 1:38 PM  Additional Follow-up for Phone Call Additional follow up Details #1::        per patient he gets his insulin and meter from the pharmacy and his Breeze2 auto disc strips from [employer]. I asked pt was it free and he said his insurance was paying for it? I also advised that his insurance should pay for the strips from the pharmacy? per pt don't send any office notes to that company. Please advise. DeShannon Smith CMA Duncan Dull)  August 09, 2010 9:20 AM   Send their form back and tell them we will not send records and are not requesting supplies from them Cindee Salt MD  August 09, 2010 9:47 AM   form faxed back and scanned. Additional Follow-up by: Mervin Hack CMA Duncan Dull),  August 09, 2010 10:36 AM

## 2010-08-14 ENCOUNTER — Ambulatory Visit: Payer: Medicare Other | Admitting: Internal Medicine

## 2010-08-14 ENCOUNTER — Encounter: Payer: Self-pay | Admitting: Internal Medicine

## 2010-08-14 DIAGNOSIS — I872 Venous insufficiency (chronic) (peripheral): Secondary | ICD-10-CM | POA: Insufficient documentation

## 2010-08-22 NOTE — Assessment & Plan Note (Signed)
Summary: right foot hurting   Vital Signs:  Patient Profile:   77 Years Old Male CC:      right foot red this am Height:     67.50 inches Temp:     98.6 degrees F Pulse rate:   84 / minute Resp:     14 per minute                  Current Allergies: * THEO-DUR (THEOPHYLLINE)History of Present Illness Chief Complaint: right foot red this am History of Present Illness:  noticed while putting on socks that right foot looked red and swollen. No pain foot or leg.. Had DVT yrs ago and worried about that. Has DM with fbs ranging 130-150 avg.  Since moving around notes that foot looks back to normal.  REVIEW OF SYSTEMS Constitutional Symptoms      Denies fever, chills, night sweats, weight loss, weight gain, and fatigue.  Eyes       Denies change in vision, eye pain, eye discharge, glasses, contact lenses, and eye surgery. Ear/Nose/Throat/Mouth       Denies hearing loss/aids, change in hearing, ear pain, ear discharge, dizziness, frequent runny nose, frequent nose bleeds, sinus problems, sore throat, hoarseness, and tooth pain or bleeding.  Respiratory       Denies dry cough, productive cough, wheezing, shortness of breath, asthma, bronchitis, and emphysema/COPD.  Cardiovascular       Denies murmurs, chest pain, and tires easily with exhertion.    Gastrointestinal       Denies stomach pain, nausea/vomiting, diarrhea, constipation, blood in bowel movements, and indigestion. Genitourniary       Denies painful urination, kidney stones, and loss of urinary control. Neurological       Denies paralysis, seizures, and fainting/blackouts. Musculoskeletal       Complains of redness and swelling.      Denies muscle pain, joint pain, joint stiffness, decreased range of motion, muscle weakness, and gout.  Skin       Denies bruising, unusual mles/lumps or sores, and hair/skin or nail changes.  Psych       Denies mood changes, temper/anger issues, anxiety/stress, speech problems, depression,  and sleep problems.  Past History:  Past Medical History: Last updated: 04/09/2010 Allergic rhinitis Colonic polyps, hx of--adenomatous COPD/pulm fibrosis--------------------------------------------Dr Freda Munro Diabetes mellitus, type II GERD Erectile dysfunction Cardiomyopathy Hyperlipidemia Renal insufficiency Osteoarthritis  Past Surgical History: Last updated: 10/13/2006 CP- ? CAD 08/04 Pneumonia-rehab 2003 Left clavicle fracture- child Left readius/ ulna fracture child Right 1 H x 2 Left 1 H x 1 Right lung bx. negative 2001 Cataracts/ IOL OU, Epes 2005, Pulman ?  2000 Myoview stress negative EF 65-70% 08/04 ABI's normal 01/07  Social History: Last updated: 08/06/2009 Marital Status: Widowed Has platonic lady lfriend since  ~2008  Cleda Daub Children: 2 sons, 1 daughter  Retired- Warden/ranger Currently seeing Corrie Dandy Physical Exam General appearance: well developed, well nourished, no acute distress. walks with cane Head: normocephalic, atraumatic Eyes: conjunctivae and lids normal Chest/Lungs: no rales, wheezes, or rhonchi bilateral, breath sounds equal without effort Extremities: sym superficial varicosities, mild non pitting edema, mild purplish discoloration sides and bottoms of feet. No tenderness, cords. Neg Homans Neurological: grossly intact and non-focal Skin: very mild scaling between toes. No other leg/feet lesions MSE: oriented to time, place, and person Assessment New Problems: UNSPECIFIED VENOUS INSUFFICIENCY (ICD-459.81)   The patient and/or caregiver has been counseled thoroughly with regard to medications prescribed including dosage, schedule, interactions, rationale for  use, and possible side effects and they verbalize understanding.  Diagnoses and expected course of recovery discussed and will return if not improved as expected or if the condition worsens. Patient and/or caregiver verbalized understanding.   Patient Instructions: 1)   elevate legs when sitting and don't cross at ankles. 2)  walk with assistance of caregiver as much as possible. 3)  Please schedule an appointment with your primary doctor if problem persists or worsens.

## 2010-08-22 NOTE — Progress Notes (Signed)
Summary: Temazepam  Phone Note Refill Request Message from:  Pharmacy on August 09, 2010 5:15 PM  Refills Requested: Medication #1:  TEMAZEPAM 30 MG CAPS Take 1 capsule by mouth at bedtime [facility]  Phone:   [phone]  Fax:   [phone]   Method Requested: Telephone to Pharmacy Initial call taken by: Delilah Shan CMA Duncan Dull),  August 09, 2010 5:16 PM  Follow-up for Phone Call        okay #30 x 1 Needs to reschedule routine visit---missed today Follow-up by: Cindee Salt MD,  August 12, 2010 1:28 PM  Additional Follow-up for Phone Call Additional follow up Details #1::        Rx called to pharmacy Additional Follow-up by: Mervin Hack CMA Duncan Dull),  August 12, 2010 3:37 PM    Prescriptions: TEMAZEPAM 30 MG CAPS (TEMAZEPAM) Take 1 capsule by mouth at bedtime  #30 x 1   Entered by:   Mervin Hack CMA (AAMA)   Authorized by:   Cindee Salt MD   Signed by:   Mervin Hack CMA (AAMA) on 08/12/2010   Method used:   Telephoned to ...       [id number] ([facility]ail[facility]       [address]       [place], [place]  [address]       Ph: [phone] or [phone]       Fax: [phone]   RxID:   [id number]

## 2010-08-27 NOTE — Medication Information (Signed)
Summary: Medication List  Medication List   Imported By: Eugenio Hoes 08/21/2010 12:15:29  _____________________________________________________________________  External Attachment:    Type:   Image     Comment:   External Document

## 2010-08-27 NOTE — Letter (Signed)
Summary: History Form  History Form   Imported By: Eugenio Hoes 08/21/2010 12:14:16  _____________________________________________________________________  External Attachment:    Type:   Image     Comment:   External Document

## 2010-08-31 ENCOUNTER — Other Ambulatory Visit: Payer: Self-pay | Admitting: Internal Medicine

## 2010-09-26 ENCOUNTER — Other Ambulatory Visit: Payer: Self-pay | Admitting: *Deleted

## 2010-09-26 DIAGNOSIS — IMO0002 Reserved for concepts with insufficient information to code with codable children: Secondary | ICD-10-CM

## 2010-09-26 DIAGNOSIS — M5410 Radiculopathy, site unspecified: Secondary | ICD-10-CM

## 2010-09-26 DIAGNOSIS — M545 Low back pain: Secondary | ICD-10-CM

## 2010-09-27 ENCOUNTER — Other Ambulatory Visit: Payer: Self-pay | Admitting: *Deleted

## 2010-09-27 ENCOUNTER — Ambulatory Visit
Admission: RE | Admit: 2010-09-27 | Discharge: 2010-09-27 | Disposition: A | Discharge status: Home / Self Care | Payer: Medicare Other | Source: Ambulatory Visit | Attending: *Deleted | Admitting: *Deleted

## 2010-09-27 DIAGNOSIS — IMO0002 Reserved for concepts with insufficient information to code with codable children: Secondary | ICD-10-CM

## 2010-09-27 DIAGNOSIS — M5410 Radiculopathy, site unspecified: Secondary | ICD-10-CM

## 2010-09-27 DIAGNOSIS — M545 Low back pain: Secondary | ICD-10-CM

## 2010-10-07 ENCOUNTER — Other Ambulatory Visit: Payer: Self-pay | Admitting: *Deleted

## 2010-10-07 MED ORDER — TEMAZEPAM 30 MG PO CAPS: 30.0000 mg | ORAL_CAPSULE | Freq: Every day | ORAL | Status: DC

## 2010-10-07 NOTE — Telephone Encounter (Signed)
rx faxed to pharmacy manually  

## 2010-10-07 NOTE — Telephone Encounter (Signed)
Faxed request from [employer] is on your desk.  Please verify quantity and refills.

## 2010-10-07 NOTE — Telephone Encounter (Signed)
Okay #30 x 1 

## 2010-10-11 NOTE — Op Note (Signed)
NAME:  Miguel Huber, Miguel Huber                        ACCOUNT NO.:  [id number]   MEDICAL RECORD NO.:  [id number]                   PATIENT TYPE:  OIB   LOCATION:  2899                                 FACILITY:  [facility]   PHYSICIAN:  Camille Bal, M.D.                   DATE OF BIRTH:  March 13, 1928   DATE OF PROCEDURE:  01/12/2003  DATE OF DISCHARGE:                                 OPERATIVE REPORT   JUSTIFICATION FOR PROCEDURE:  The patient had a cataract in the right eye  with decreased vision.  He was told of risks of infection, bleeding, loss of  vision, vitreous, need for glasses postoperatively, and remained  enthusiastic about surgery.   SURGEON:  Camille Bal, M.D.   DIAGNOSES:  Cataract to the right eye.   PROCEDURE:  Right phacoemulsification with insert of an SI-40 MB 22.5  diopter silicone posterior chamber IOL.   ANESTHESIA:  Topical.   OPERATIVE PROCEDURE:  The patient as prepped and draped in the usual sterile  fashion.  A blepharostat was inserted in the right eye.  A 15 degree  Superblade was used to enter the anterior chamber at the limbus at the 2  o'clock position.  Then, using a keratome a 2.75 mm clear corneal tunnel was  made to enter the anterior chamber.  Viscoelastic was injected and a  cystotome was used to create a rent in the anterior capsule and along with  Utrata forceps a capsulorrhexis was performed.  Hydrodissection was then  performed to free up the nucleus and the phacoemulsification tip was  introduced to emulsify the nucleus with a phaco time of 1.51 minutes with  25% actual power used.  Then Cavitron irrigation/aspiration was used to  aspirate the remaining cortex, polish the posterior capsule.  Viscoelastic  was injected and the wound was enlarged to 3 mm to allow insertion with a  shooter of a 22.5 diopter SI-40 MB posterior chamber IOL.  The lens was  dilated in position using a Kuglen hook, positioning the haptics vertically.  It was well  positioned centrally and the ______ was used to remove the  remaining viscoelastic.  Miochol 0.5 mL was injected.  The wound was  hydrated and tested dry to Weck-cel testing.  At the end of the procedure  the cornea was clear with a deep clear anterior chamber, round pupil, and  IOL in perfect position.  One drop of Ocuflox was placed in the eye.  Eye  was shielded.  After checking the patient could count fingers immediately at  the end of the surgery.  He is discharged home to be seen as an outpatient.  Camille Bal, M.D.    JC/MEDQ  D:  01/12/2003  T:  01/12/2003  Job:  [id number]

## 2010-10-24 ENCOUNTER — Other Ambulatory Visit: Payer: Self-pay | Admitting: *Deleted

## 2010-10-24 MED ORDER — TRAMADOL HCL 50 MG PO TABS: ORAL_TABLET | ORAL | Status: DC

## 2010-10-24 NOTE — Telephone Encounter (Signed)
Okay #60 x 0 

## 2010-10-24 NOTE — Telephone Encounter (Signed)
rx sent to pharmacy

## 2010-10-24 NOTE — Telephone Encounter (Signed)
Phoned request from pt, please check quantity and refill amounts. 

## 2010-11-11 ENCOUNTER — Other Ambulatory Visit: Payer: Self-pay | Admitting: *Deleted

## 2010-11-11 NOTE — Telephone Encounter (Signed)
Please find out what is going on He just filled this less than 3 weeks ago

## 2010-11-11 NOTE — Telephone Encounter (Signed)
Fax is on your desk . 

## 2010-11-12 NOTE — Telephone Encounter (Signed)
Spoke with pharmacist and she states they have one on hold and the fax was just a mistake. Tried calling patient and per his son pt was still asleep.

## 2010-11-18 ENCOUNTER — Other Ambulatory Visit: Payer: Self-pay | Admitting: *Deleted

## 2010-11-18 MED ORDER — FUROSEMIDE 80 MG PO TABS: 80.0000 mg | ORAL_TABLET | Freq: Every day | ORAL | Status: DC

## 2010-11-28 ENCOUNTER — Other Ambulatory Visit: Payer: Self-pay | Admitting: *Deleted

## 2010-11-28 MED ORDER — TEMAZEPAM 30 MG PO CAPS: 30.0000 mg | ORAL_CAPSULE | Freq: Every day | ORAL | Status: DC

## 2010-11-28 NOTE — Telephone Encounter (Signed)
Okay #30 x 0 Needs to set up follow up appt

## 2010-11-28 NOTE — Telephone Encounter (Signed)
[facility] on your desk  

## 2010-11-28 NOTE — Telephone Encounter (Signed)
rx faxed to pharmacy manually  

## 2010-12-03 ENCOUNTER — Ambulatory Visit (INDEPENDENT_AMBULATORY_CARE_PROVIDER_SITE_OTHER): Payer: Medicare Other | Admitting: Internal Medicine

## 2010-12-03 ENCOUNTER — Encounter: Payer: Self-pay | Admitting: Internal Medicine

## 2010-12-03 VITALS — BP 140/80 | HR 81 | Temp 98.1°F | Ht 67.0 in | Wt 198.0 lb

## 2010-12-03 DIAGNOSIS — IMO0002 Reserved for concepts with insufficient information to code with codable children: Secondary | ICD-10-CM

## 2010-12-03 DIAGNOSIS — J449 Chronic obstructive pulmonary disease, unspecified: Secondary | ICD-10-CM

## 2010-12-03 DIAGNOSIS — K219 Gastro-esophageal reflux disease without esophagitis: Secondary | ICD-10-CM

## 2010-12-03 DIAGNOSIS — E785 Hyperlipidemia, unspecified: Secondary | ICD-10-CM

## 2010-12-03 DIAGNOSIS — E119 Type 2 diabetes mellitus without complications: Secondary | ICD-10-CM

## 2010-12-03 LAB — BASIC METABOLIC PANEL
CO2: 33 mEq/L — ABNORMAL HIGH (ref 19–32)
Glucose, Bld: 151 mg/dL — ABNORMAL HIGH (ref 70–99)
Potassium: 4 mEq/L (ref 3.5–5.1)
Sodium: 147 mEq/L — ABNORMAL HIGH (ref 135–145)

## 2010-12-03 LAB — CBC WITH DIFFERENTIAL/PLATELET
Basophils Absolute: 0 10*3/uL (ref 0.0–0.1)
Basophils Relative: 0.3 % (ref 0.0–3.0)
Eosinophils Absolute: 0.2 10*3/uL (ref 0.0–0.7)
HCT: 46.2 % (ref 39.0–52.0)
Hemoglobin: 15.9 g/dL (ref 13.0–17.0)
Lymphs Abs: 2.8 10*3/uL (ref 0.7–4.0)
MCHC: 34.3 g/dL (ref 30.0–36.0)
Monocytes Relative: 7.3 % (ref 3.0–12.0)
Neutro Abs: 5.6 10*3/uL (ref 1.4–7.7)
RBC: 4.8 Mil/uL (ref 4.22–5.81)
RDW: 14.1 % (ref 11.5–14.6)

## 2010-12-03 LAB — HEPATIC FUNCTION PANEL
ALT: 19 U/L (ref 0–53)
Albumin: 4.2 g/dL (ref 3.5–5.2)
Alkaline Phosphatase: 94 U/L (ref 39–117)
Total Protein: 7.4 g/dL (ref 6.0–8.3)

## 2010-12-03 LAB — HEMOGLOBIN A1C: Hgb A1c MFr Bld: 7.1 % — ABNORMAL HIGH (ref 4.6–6.5)

## 2010-12-03 MED ORDER — TEMAZEPAM 30 MG PO CAPS: 30.0000 mg | ORAL_CAPSULE | Freq: Every day | ORAL | Status: DC

## 2010-12-03 NOTE — Assessment & Plan Note (Signed)
Epidural not much help Takes tramadol regularly

## 2010-12-03 NOTE — Progress Notes (Signed)
Subjective:    Patient ID: Miguel Huber, male    DOB: Sep 10, 1927, 77 y.o.   MRN: [id number]  HPI Not doing so well Now needs rolling walker Had epidural injection in May--this decreased back pain but still with decreased sensation in leg Feels his leg "gives way" at times Hasn't fallen since using walker  Has pain at left 2nd DIP Not too bad Uses the tramadol regularly for back and it does help. Doesn't seem to help leg  Checks sugars bid Hasn't generally adjusted insulin (but holds if under 100) Range is 66-203 Does have mild symptoms if under 100 (jittery)  Breathing is stable Uses oxygen all the time Some trouble with the 100 degree days No regular cough  No stomach trouble Regular with med  Current Outpatient Prescriptions on File Prior to Visit  Medication Sig Dispense Refill  . aspirin 81 MG tablet Take 81 mg by mouth daily.        . Fluticasone-Salmeterol (ADVAIR DISKUS) 250-50 MCG/DOSE AEPB Inhale 1 puff into the lungs 2 (two) times daily.        . furosemide (LASIX) 80 MG tablet Take 1 tablet (80 mg total) by mouth daily.  30 tablet  2  . insulin NPH (HUMULIN N) 100 UNIT/ML injection Inject 25 Units into the skin 2 (two) times daily.       . insulin regular (NOVOLIN R INNOLET) 100 UNIT/ML injection Inject 10 Units into the skin 2 (two) times daily before a meal.       . Insulin Syringe-Needle U-100 (SURE COMFORT INS SYR .3CC/29G) 29G X 1/2" 0.3 ML MISC by Does not apply route. Use as directed       . ipratropium-albuterol (DUONEB) 0.5-2.5 (3) MG/3ML SOLN Take 3 mLs by nebulization every 6 (six) hours as needed.       . potassium chloride SA (KLOR-CON M20) 20 MEQ tablet Take 20 mEq by mouth daily.        . simvastatin (ZOCOR) 20 MG tablet Take 20 mg by mouth at bedtime.        . temazepam (RESTORIL) 30 MG capsule Take 1 capsule (30 mg total) by mouth at bedtime.  30 capsule  0  . traMADol (ULTRAM) 50 MG tablet Take one by mouth up to four times daily for pain or  cough  60 tablet  0  . DISCONTD: ipratropium-albuterol (DUONEB) 0.5-2.5 (3) MG/3ML SOLN Take 3 mLs by nebulization. 2 puffs every 6 hours when not using nebulizer       . DISCONTD: omeprazole (PRILOSEC) 20 MG capsule TAKE ONE CAPSULE BY MOUTH EVERY DAY FOR ACID REFLUX  30 capsule  11  . DISCONTD: Oxygen Permeable Lens Products SOLN by Does not apply route. Uses 2 liters         Allergies  Allergen Reactions  . Theophyllines     Past Medical History  Diagnosis Date  . Allergic rhinitis   . Hx of adenomatous colonic polyps   . Pulmonary fibrosis   . COPD (chronic obstructive pulmonary disease)   . Diabetes mellitus type 2, insulin dependent   . GERD (gastroesophageal reflux disease)   . Erectile dysfunction   . Cardiomyopathy   . Hyperlipidemia   . Renal insufficiency   . Osteoarthritis   . Diabetes mellitus     Past Surgical History  Procedure Date  . Orif clavicle fracture     Left clavicle fracture- child  . Orif ulnar fracture     Left readius/ ulna  fracture child  . Lung biopsy     Right lung bx. negative 2001  . Cataract extraction     Cataracts/ IOL OU, Epes 2005, Pulman ?  2000  . Cardiovascular stress test     Myoview stress negative EF 65-70% 08/04    Family History  Problem Relation Age of Onset  . Cancer Neg Hx     History   Social History  . Marital Status: Married    Spouse Name: N/A    Number of Children: 3  . Years of Education: N/A   Occupational History  . retired     Ambulance person   Social History Main Topics  . Smoking status: Former Games developer  . Smokeless tobacco: Not on file  . Alcohol Use: Not on file  . Drug Use: Not on file  . Sexually Active: Not on file   Other Topics Concern  . Not on file   Social History Narrative   Has platonic lady lfriend since ~2008  [facility] seeing Corrie Dandy   Review of Systems Still with lady friend Sleeps okay Appetite is fine Weight is stable     Objective:   Physical Exam    Constitutional: He appears well-developed and well-nourished. No distress.  Neck: Normal range of motion. Neck supple. No thyromegaly present.  Cardiovascular: Normal rate, regular rhythm, normal heart sounds and intact distal pulses.  Exam reveals no gallop.   No murmur heard.      Faint pulse on right, 1+ in left foot  Pulmonary/Chest: Effort normal. No respiratory distress. He has no wheezes. He has no rales.       Decreased breath sounds but clear  Abdominal: Soft. There is no tenderness.  Musculoskeletal: Normal range of motion. He exhibits no tenderness.       Trace to 1+ edema in feet  Lymphadenopathy:    He has no cervical adenopathy.  Neurological:       Decreased sensation in left foot and leg  Skin:       No ulcers  Psychiatric: He has a normal mood and affect. His behavior is normal. Judgment and thought content normal.          Assessment & Plan:

## 2010-12-03 NOTE — Assessment & Plan Note (Signed)
Moderate restriction with activities  On oxygen

## 2010-12-03 NOTE — Assessment & Plan Note (Signed)
Still seems to have good control No changes Check labs  Lab Results  Component Value Date   HGBA1C 7.3* 02/11/2010

## 2010-12-03 NOTE — Assessment & Plan Note (Signed)
Okay on the med Will continue given his lung disease

## 2010-12-20 ENCOUNTER — Ambulatory Visit (INDEPENDENT_AMBULATORY_CARE_PROVIDER_SITE_OTHER): Payer: Medicare Other | Admitting: Family Medicine

## 2010-12-20 ENCOUNTER — Encounter: Payer: Self-pay | Admitting: Family Medicine

## 2010-12-20 ENCOUNTER — Ambulatory Visit (INDEPENDENT_AMBULATORY_CARE_PROVIDER_SITE_OTHER)
Admission: RE | Admit: 2010-12-20 | Discharge: 2010-12-20 | Disposition: A | Discharge status: Home / Self Care | Payer: Medicare Other | Source: Ambulatory Visit | Attending: Family Medicine | Admitting: Family Medicine

## 2010-12-20 VITALS — BP 110/70 | HR 76 | Temp 98.1°F | Wt 198.0 lb

## 2010-12-20 DIAGNOSIS — M79609 Pain in unspecified limb: Secondary | ICD-10-CM

## 2010-12-20 DIAGNOSIS — M79673 Pain in unspecified foot: Secondary | ICD-10-CM

## 2010-12-20 MED ORDER — HYDROCODONE-ACETAMINOPHEN 5-500 MG PO TABS: 0.5000 | ORAL_TABLET | Freq: Three times a day (TID) | ORAL | Status: AC | PRN

## 2010-12-20 NOTE — Patient Instructions (Signed)
Wear the hard soled shoe and try to stay off your foot over the weekend.  Use the vicodin as needed.  Start with half a pill.  It can make you drowsy.  Let us know if you aren't improving by next week.  Take care.

## 2010-12-20 NOTE — Progress Notes (Signed)
L foot pain.  Sore to the touch on dorsum of L foot, ~1 week.  No trauma.  No bruising.  Some swelling.  He can weight bear, but with pain.  Uses walker at baseline.  Hurts all the time, no change over the last week.    H/o radicular sx in back, but this better and doesn't appear to be related.  Meds, vitals, and allergies reviewed.   ROS: See HPI.  Otherwise, noncontributory..  nad L foot with no bruising or sig edema.  Intact DP pulse, but ttp along the dorsum of the midfoot.  Pain on rom of the toes, but no active synovitis at the MTP joints or the IP joints.  Plantar side of foot not ttp. Ankle not ttp  Plain films reviewed and d/w pt.

## 2010-12-22 DIAGNOSIS — M79673 Pain in unspecified foot: Secondary | ICD-10-CM | POA: Insufficient documentation

## 2010-12-22 NOTE — Assessment & Plan Note (Addendum)
Likely dorsal tendonitis.  He does worse with certain shoes.  Use post op shoe to try to take some pressure off the dorsum and use pain meds with sedation caution.  He'll call back as needed.  He agrees.  >25 min spent with face to face with patient, >50% counseling and/or coordinating care, reviewing films.

## 2010-12-27 ENCOUNTER — Encounter: Payer: Self-pay | Admitting: *Deleted

## 2010-12-27 ENCOUNTER — Telehealth: Payer: Self-pay | Admitting: *Deleted

## 2010-12-27 NOTE — Telephone Encounter (Signed)
Noted  

## 2010-12-27 NOTE — Telephone Encounter (Signed)
Patient wanted to let you know that what you did has helped.  You asked him to call today to report.

## 2010-12-28 ENCOUNTER — Other Ambulatory Visit: Payer: Self-pay | Admitting: Internal Medicine

## 2011-01-02 ENCOUNTER — Ambulatory Visit (INDEPENDENT_AMBULATORY_CARE_PROVIDER_SITE_OTHER): Payer: Medicare Other | Admitting: Internal Medicine

## 2011-01-02 ENCOUNTER — Encounter: Payer: Self-pay | Admitting: Internal Medicine

## 2011-01-02 DIAGNOSIS — M109 Gout, unspecified: Secondary | ICD-10-CM | POA: Insufficient documentation

## 2011-01-02 MED ORDER — COLCHICINE 0.6 MG PO TABS: 0.6000 mg | ORAL_TABLET | Freq: Two times a day (BID) | ORAL | Status: DC

## 2011-01-02 NOTE — Patient Instructions (Signed)
Please take your first colchicine right away and the second one about 2 hours later. Then continue twice a day. If the foot gets better, continue twice a day for a week, then once a day for a week, then you can stop

## 2011-01-02 NOTE — Assessment & Plan Note (Signed)
No previous history but that is most likely with no injury, normal x-ray and the findings Will try colchicine Check uric acid level

## 2011-01-02 NOTE — Progress Notes (Signed)
Subjective:    Patient ID: Miguel Huber, male    DOB: Apr 21, 1928, 77 y.o.   MRN: [id number]  HPI Has had left foot swelling on top and bottom Tender to touch Started after he noted pinched nerve in back--no twisting or similar Post-op shoe helps prevent pain on the top  Gets "pulsating hurt" distally on foot occ sharp pain in great toe--like someone is sticking a needle in it  No history of gout  Current Outpatient Prescriptions on File Prior to Visit  Medication Sig Dispense Refill  . albuterol-ipratropium (COMBIVENT) 18-103 MCG/ACT inhaler Inhale 2 puffs into the lungs every 6 (six) hours as needed.        Marland Kitchen aspirin 81 MG tablet Take 81 mg by mouth daily.        . Fluticasone-Salmeterol (ADVAIR DISKUS) 250-50 MCG/DOSE AEPB Inhale 1 puff into the lungs 2 (two) times daily.        . furosemide (LASIX) 80 MG tablet Take 1 tablet (80 mg total) by mouth daily.  30 tablet  2  . insulin NPH (HUMULIN N) 100 UNIT/ML injection Inject 25 Units into the skin 2 (two) times daily.       . insulin regular (NOVOLIN R INNOLET) 100 UNIT/ML injection Inject 10 Units into the skin 2 (two) times daily before a meal.       . Insulin Syringe-Needle U-100 (SURE COMFORT INS SYR .3CC/29G) 29G X 1/2" 0.3 ML MISC by Does not apply route. Use as directed       . ipratropium-albuterol (DUONEB) 0.5-2.5 (3) MG/3ML SOLN Take 3 mLs by nebulization every 6 (six) hours as needed.       Marland Kitchen KLOR-CON M20 20 MEQ tablet TAKE 1 TABLET BY MOUTH EVERY DAY  30 tablet  6  . omeprazole (PRILOSEC) 20 MG capsule Take 20 mg by mouth daily.        . simvastatin (ZOCOR) 20 MG tablet Take 20 mg by mouth at bedtime.        . temazepam (RESTORIL) 30 MG capsule Take 1 capsule (30 mg total) by mouth at bedtime.  30 capsule  1  . traMADol (ULTRAM) 50 MG tablet Take one by mouth up to four times daily for pain or cough  60 tablet  0    Allergies  Allergen Reactions  . Theophyllines     Past Medical History  Diagnosis Date  .  Allergic rhinitis   . Hx of adenomatous colonic polyps   . Pulmonary fibrosis   . COPD (chronic obstructive pulmonary disease)   . Diabetes mellitus type 2, insulin dependent   . GERD (gastroesophageal reflux disease)   . Erectile dysfunction   . Cardiomyopathy   . Hyperlipidemia   . Renal insufficiency   . Osteoarthritis   . Diabetes mellitus     Past Surgical History  Procedure Date  . Orif clavicle fracture     Left clavicle fracture- child  . Orif ulnar fracture     Left readius/ ulna fracture child  . Lung biopsy     Right lung bx. negative 2001  . Cataract extraction     Cataracts/ IOL OU, Epes 2005, Pulman ?  2000  . Cardiovascular stress test     Myoview stress negative EF 65-70% 08/04    Family History  Problem Relation Age of Onset  . Cancer Neg Hx     History   Social History  . Marital Status: Married    Spouse Name: N/A  Number of Children: 3  . Years of Education: N/A   Occupational History  . retired     Ambulance person   Social History Main Topics  . Smoking status: Former Games developer  . Smokeless tobacco: Never Used  . Alcohol Use: Not on file  . Drug Use: Not on file  . Sexually Active: Not on file   Other Topics Concern  . Not on file   Social History Narrative   Has platonic lady lfriend since ~2008  [facility] seeing [facility]   Review of Systems No fever Appetite is fine Doesn't feel sick    Objective:   Physical Exam  Constitutional: He appears well-developed and well-nourished. No distress.  Musculoskeletal:       Diffuse swelling of left foot Sig tenderness along MTPs--most at 2nd Ankle is tender as well as distal metatarsal joints          Assessment & Plan:

## 2011-01-08 ENCOUNTER — Telehealth: Payer: Self-pay | Admitting: *Deleted

## 2011-01-08 DIAGNOSIS — M79673 Pain in unspecified foot: Secondary | ICD-10-CM

## 2011-01-08 NOTE — Telephone Encounter (Signed)
Form for diabetic supplies is on your desk.  I checked with the patient and he does want these supplies.  He says she company that he uses has been bought out by [employer].

## 2011-01-08 NOTE — Telephone Encounter (Signed)
Message copied by Sueanne Margarita on Wed Jan 08, 2011  3:37 PM ------      Message from: Tillman Abide I      Created: Fri Jan 03, 2011  1:50 PM       Please call      The uric acid level was not really elevated. That argues against the gout. Did the medication help at all??

## 2011-01-08 NOTE — Telephone Encounter (Signed)
.  left message to have patient return my call.  

## 2011-01-10 NOTE — Telephone Encounter (Signed)
He checks bid

## 2011-01-10 NOTE — Telephone Encounter (Signed)
Spoke with patient and the medication did not help, he states his foot is still swollen, please advise. I also told pt I had been calling him all day and he stated he's been in and out and now he's at his lady friend's house. I advised that Dr.Letvak was gone for the day and it may be Monday or if Dr.Letvak called himself. I advised pt to keep his foot elevated.

## 2011-01-10 NOTE — Telephone Encounter (Signed)
.  left message to have patient return my call.  

## 2011-01-12 NOTE — Telephone Encounter (Signed)
Please check again on Monday His x-ray was normal so I don't suspect something serious now Make sure he has no fever and that the foot isn't worse May need to recheck it

## 2011-01-13 NOTE — Telephone Encounter (Signed)
Spoke to patient and was informed that there is not any change in his foot, not any better or any worse.. Patient states that he does not have a fever and his foot still hurts and is swollen.

## 2011-01-13 NOTE — Telephone Encounter (Signed)
Left message on machine to call back  

## 2011-01-13 NOTE — Telephone Encounter (Signed)
I would probably refer him to an orthopedist if it is not better Have him let us know if he wants a referral to see specialist

## 2011-01-14 NOTE — Telephone Encounter (Signed)
Referral made 

## 2011-01-14 NOTE — Telephone Encounter (Signed)
.  left message to have patient return my call.  

## 2011-01-14 NOTE — Telephone Encounter (Signed)
Patient called back and he would like the referral to the orthopedist.

## 2011-01-15 NOTE — Telephone Encounter (Signed)
Appt made with Dr Alberteen Spindle on 01/23/2011 at 8:30 patient has been notified.

## 2011-01-31 ENCOUNTER — Other Ambulatory Visit: Payer: Self-pay | Admitting: *Deleted

## 2011-01-31 MED ORDER — TEMAZEPAM 30 MG PO CAPS: 30.0000 mg | ORAL_CAPSULE | Freq: Every day | ORAL | Status: DC

## 2011-01-31 NOTE — Telephone Encounter (Signed)
Okay #30 x 1 

## 2011-01-31 NOTE — Telephone Encounter (Signed)
Received faxed refill request form in your IN box. 

## 2011-01-31 NOTE — Telephone Encounter (Signed)
I called [name] and was told #30 x 1 refill had already been called in. I updated med list.

## 2011-01-31 NOTE — Telephone Encounter (Signed)
Also, [facility] has faxed an order form for diabetic supplies form is on your desk.  Pt does want these supplies, states he checks his blood sugar two times a day.

## 2011-01-31 NOTE — Telephone Encounter (Signed)
Form for diabetic supplies faxed back to [facility].

## 2011-02-06 ENCOUNTER — Other Ambulatory Visit: Payer: Self-pay | Admitting: Internal Medicine

## 2011-02-21 ENCOUNTER — Ambulatory Visit (INDEPENDENT_AMBULATORY_CARE_PROVIDER_SITE_OTHER): Payer: Medicare Other | Admitting: Internal Medicine

## 2011-02-21 ENCOUNTER — Encounter: Payer: Self-pay | Admitting: Internal Medicine

## 2011-02-21 VITALS — BP 148/80 | HR 60 | Temp 97.7°F | Wt 199.0 lb

## 2011-02-21 DIAGNOSIS — M79604 Pain in right leg: Secondary | ICD-10-CM | POA: Insufficient documentation

## 2011-02-21 DIAGNOSIS — M79609 Pain in unspecified limb: Secondary | ICD-10-CM

## 2011-02-21 DIAGNOSIS — Z23 Encounter for immunization: Secondary | ICD-10-CM

## 2011-02-21 MED ORDER — OXYCODONE-ACETAMINOPHEN 5-325 MG PO TABS: 1.0000 | ORAL_TABLET | Freq: Every evening | ORAL | Status: AC | PRN

## 2011-02-21 NOTE — Patient Instructions (Signed)
Please try the oxycodone pain pills for your leg pain at night If you are not any better within about 2 weeks, call for appt again with Dr Zachery Dauer

## 2011-02-21 NOTE — Progress Notes (Signed)
Subjective:    Patient ID: Miguel Huber, male    DOB: Feb 17, 1928, 76 y.o.   MRN: [id number]  HPI Miguel Huber to Dr Alberteen Spindle for the foot pain He did other x-rays and found a fracture--with oblique views apparently Now in walking boot Pain is better Swelling better  Now having right leg pain--started about 6-8 weeks ago Relates to pinched nerve in back Unable to sleep Similar to pain he had on left leg in past  Some weakness in leg Tramadol hasn't been helping  No recent fall or back injury  Current Outpatient Prescriptions on File Prior to Visit  Medication Sig Dispense Refill  . albuterol-ipratropium (COMBIVENT) 18-103 MCG/ACT inhaler Inhale 2 puffs into the lungs every 6 (six) hours as needed.        Marland Kitchen aspirin 81 MG tablet Take 81 mg by mouth daily.        . colchicine (COLCRYS) 0.6 MG tablet Take 1 tablet (0.6 mg total) by mouth 2 (two) times daily.  60 tablet  1  . Fluticasone-Salmeterol (ADVAIR DISKUS) 250-50 MCG/DOSE AEPB Inhale 1 puff into the lungs 2 (two) times daily.        . furosemide (LASIX) 80 MG tablet Take 1 tablet (80 mg total) by mouth daily.  30 tablet  2  . insulin regular (NOVOLIN R INNOLET) 100 UNIT/ML injection Inject 10 Units into the skin 2 (two) times daily before a meal.       . Insulin Syringe-Needle U-100 (SURE COMFORT INS SYR .3CC/29G) 29G X 1/2" 0.3 ML MISC by Does not apply route. Use as directed       . ipratropium-albuterol (DUONEB) 0.5-2.5 (3) MG/3ML SOLN Take 3 mLs by nebulization every 6 (six) hours as needed.       Marland Kitchen KLOR-CON M20 20 MEQ tablet TAKE 1 TABLET BY MOUTH EVERY DAY  30 tablet  6  . NOVOLIN N 100 UNIT/ML injection INJECT 25 UNITS SUBQ TWO TIMES A DAY  10 mL  11  . omeprazole (PRILOSEC) 20 MG capsule Take 20 mg by mouth daily.        . simvastatin (ZOCOR) 20 MG tablet Take 20 mg by mouth at bedtime.        . temazepam (RESTORIL) 30 MG capsule Take 1 capsule (30 mg total) by mouth at bedtime.  30 capsule  1  . traMADol (ULTRAM) 50 MG tablet  Take one by mouth up to four times daily for pain or cough  60 tablet  0    Allergies  Allergen Reactions  . Theophyllines     Past Medical History  Diagnosis Date  . Allergic rhinitis   . Hx of adenomatous colonic polyps   . Pulmonary fibrosis   . COPD (chronic obstructive pulmonary disease)   . Diabetes mellitus type 2, insulin dependent   . GERD (gastroesophageal reflux disease)   . Erectile dysfunction   . Cardiomyopathy   . Hyperlipidemia   . Renal insufficiency   . Osteoarthritis   . Diabetes mellitus     Past Surgical History  Procedure Date  . Orif clavicle fracture     Left clavicle fracture- child  . Orif ulnar fracture     Left readius/ ulna fracture child  . Lung biopsy     Right lung bx. negative 2001  . Cataract extraction     Cataracts/ IOL OU, Epes 2005, [place] ?  2000  . Cardiovascular stress test     Myoview stress negative EF 65-70% 08/04  Family History  Problem Relation Age of Onset  . Cancer Neg Hx     History   Social History  . Marital Status: Married    Spouse Name: N/A    Number of Children: 3  . Years of Education: N/A   Occupational History  . retired     Ambulance person   Social History Main Topics  . Smoking status: Former Games developer  . Smokeless tobacco: Never Used  . Alcohol Use: Not on file  . Drug Use: Not on file  . Sexually Active: Not on file   Other Topics Concern  . Not on file   Social History Narrative   Has platonic lady lfriend since ~2008  [facility] seeing [facility]   Review of Systems No problems with bowel or bladder function No right leg swelling     Objective:   Physical Exam  Constitutional: He appears well-developed and well-nourished.  Cardiovascular:       Faint pulse right leg  Musculoskeletal:       No spine tenderness Fair back flexion Pretty normal internal and external rotation of right hip Trace edema of right calf but no tenderness  Neurological:       No focal leg  weakness          Assessment & Plan:

## 2011-02-21 NOTE — Assessment & Plan Note (Signed)
Sounds radicular but January 2012 MRI shows only left sided compression of spine Will try percocet for sleep If persists over the next couple of weeks, will send back to Dr Zachery Dauer

## 2011-03-03 ENCOUNTER — Other Ambulatory Visit: Payer: Self-pay | Admitting: *Deleted

## 2011-03-03 NOTE — Telephone Encounter (Signed)
rx faxed to pharmacy manually Spoke with patient and he does have a cough, no fever or sob, pt states if it gets worse he will call.

## 2011-03-03 NOTE — Telephone Encounter (Signed)
Okay to refill #240cc x 0 See if he is sick or just having some cough appt if fever or any SOB

## 2011-03-03 NOTE — Telephone Encounter (Signed)
Received faxed refill request from pharmacy for Hydrocodone-Homatropine syrup, take 1 teaspoonful at night at needed for severe cough. Form is in your in box.

## 2011-03-05 ENCOUNTER — Other Ambulatory Visit: Payer: Self-pay | Admitting: *Deleted

## 2011-03-05 MED ORDER — FUROSEMIDE 80 MG PO TABS: 80.0000 mg | ORAL_TABLET | Freq: Every day | ORAL | Status: DC

## 2011-03-17 ENCOUNTER — Other Ambulatory Visit: Payer: Self-pay | Admitting: Internal Medicine

## 2011-04-04 ENCOUNTER — Other Ambulatory Visit: Payer: Self-pay | Admitting: *Deleted

## 2011-04-04 NOTE — Telephone Encounter (Signed)
Last refill 03/10/11

## 2011-04-05 NOTE — Telephone Encounter (Signed)
Okay #30 x 1 

## 2011-04-07 MED ORDER — TEMAZEPAM 30 MG PO CAPS: 30.0000 mg | ORAL_CAPSULE | Freq: Every day | ORAL | Status: DC

## 2011-04-07 NOTE — Telephone Encounter (Signed)
rx called into pharmacy

## 2011-04-21 ENCOUNTER — Other Ambulatory Visit: Payer: Self-pay | Admitting: Internal Medicine

## 2011-06-06 ENCOUNTER — Ambulatory Visit (INDEPENDENT_AMBULATORY_CARE_PROVIDER_SITE_OTHER): Payer: Medicare Other | Admitting: Internal Medicine

## 2011-06-06 ENCOUNTER — Encounter: Payer: Self-pay | Admitting: Internal Medicine

## 2011-06-06 VITALS — BP 150/82 | HR 72 | Temp 97.2°F | Ht 67.0 in | Wt 208.0 lb

## 2011-06-06 DIAGNOSIS — I10 Essential (primary) hypertension: Secondary | ICD-10-CM | POA: Insufficient documentation

## 2011-06-06 DIAGNOSIS — M1990 Unspecified osteoarthritis, unspecified site: Secondary | ICD-10-CM

## 2011-06-06 DIAGNOSIS — E785 Hyperlipidemia, unspecified: Secondary | ICD-10-CM | POA: Diagnosis not present

## 2011-06-06 DIAGNOSIS — J4489 Other specified chronic obstructive pulmonary disease: Secondary | ICD-10-CM

## 2011-06-06 DIAGNOSIS — E1129 Type 2 diabetes mellitus with other diabetic kidney complication: Secondary | ICD-10-CM | POA: Diagnosis not present

## 2011-06-06 DIAGNOSIS — J449 Chronic obstructive pulmonary disease, unspecified: Secondary | ICD-10-CM

## 2011-06-06 LAB — BASIC METABOLIC PANEL
BUN: 26 mg/dL — ABNORMAL HIGH (ref 6–23)
Chloride: 103 mEq/L (ref 96–112)
Glucose, Bld: 119 mg/dL — ABNORMAL HIGH (ref 70–99)
Potassium: 3.7 mEq/L (ref 3.5–5.1)
Sodium: 145 mEq/L (ref 135–145)

## 2011-06-06 LAB — MICROALBUMIN / CREATININE URINE RATIO: Creatinine,U: 136.2 mg/dL

## 2011-06-06 MED ORDER — TEMAZEPAM 30 MG PO CAPS: 30.0000 mg | ORAL_CAPSULE | Freq: Every day | ORAL | Status: DC

## 2011-06-06 MED ORDER — LISINOPRIL 10 MG PO TABS: 10.0000 mg | ORAL_TABLET | Freq: Every day | ORAL | Status: DC

## 2011-06-06 MED ORDER — TRAMADOL HCL 50 MG PO TABS: 50.0000 mg | ORAL_TABLET | Freq: Every evening | ORAL | Status: DC | PRN

## 2011-06-06 NOTE — Assessment & Plan Note (Signed)
Sugar control still seems fine Will check Will add lisinopril

## 2011-06-06 NOTE — Assessment & Plan Note (Signed)
Lab Results  Component Value Date   LDLCALC 44 12/03/2010   At goal No changes

## 2011-06-06 NOTE — Assessment & Plan Note (Signed)
Will refill the tramadol 

## 2011-06-06 NOTE — Patient Instructions (Signed)
Please start the lisinopril for your blood pressure and to protect your kidneys. Call if you have any problems like chronic cough or dizziness Please set up blood work in about 1 month (met b--401.9)

## 2011-06-06 NOTE — Assessment & Plan Note (Signed)
Stable on current inhalers and oxygen

## 2011-06-06 NOTE — Progress Notes (Signed)
Addended by: Tillman Abide I on: 06/06/2011 12:14 PM   Modules accepted: Orders

## 2011-06-06 NOTE — Progress Notes (Signed)
Subjective:    Patient ID: Miguel Huber, male    DOB: 1927/12/29, 78 y.o.   MRN: [id number]  HPI Not doing so good Balance and strength problems----needs walker to get around Has had a number of falls and needs help to get up (twice in 3-4 months)---only has fallen when not using walker  Had knee evaluated at [facility] clinic Having pain mostly at night Asks for pain med --discussed that he should try the tramadol  Checks sugars bid Forgot book but average 160---fasting in AM and then before dinner No hypoglycemic reactions lately Hasn't been adjusting insulin but does reduce regular if running low  No chest pain Gets DOE which is stable Comfortable at rest on the oxygen No sig edema  Current Outpatient Prescriptions on File Prior to Visit  Medication Sig Dispense Refill  . albuterol-ipratropium (COMBIVENT) 18-103 MCG/ACT inhaler Inhale 2 puffs into the lungs every 6 (six) hours as needed.        Marland Kitchen aspirin 81 MG tablet Take 81 mg by mouth daily.        . B-D INS SYRINGE 0.5CC/30GX1/2" 30G X 1/2" 0.5 ML MISC USE AS DIRECTED  100 each  11  . colchicine (COLCRYS) 0.6 MG tablet Take 1 tablet (0.6 mg total) by mouth 2 (two) times daily.  60 tablet  1  . Fluticasone-Salmeterol (ADVAIR DISKUS) 250-50 MCG/DOSE AEPB Inhale 1 puff into the lungs 2 (two) times daily.        . furosemide (LASIX) 80 MG tablet Take 1 tablet (80 mg total) by mouth daily.  30 tablet  11  . ipratropium-albuterol (DUONEB) 0.5-2.5 (3) MG/3ML SOLN Take 3 mLs by nebulization every 6 (six) hours as needed.       Marland Kitchen KLOR-CON M20 20 MEQ tablet TAKE 1 TABLET BY MOUTH EVERY DAY  30 tablet  6  . NOVOLIN N 100 UNIT/ML injection INJECT 25 UNITS SUBQ TWO TIMES A DAY  10 mL  11  . NOVOLIN R 100 UNIT/ML injection INJECT 10 UNITS TWO TIMES A DAY  10 mL  6  . omeprazole (PRILOSEC) 20 MG capsule Take 20 mg by mouth daily.        . simvastatin (ZOCOR) 20 MG tablet Take 20 mg by mouth at bedtime.        . temazepam (RESTORIL) 30  MG capsule Take 1 capsule (30 mg total) by mouth at bedtime.  30 capsule  1  . traMADol (ULTRAM) 50 MG tablet Take one by mouth up to four times daily for pain or cough  60 tablet  0    Allergies  Allergen Reactions  . Theophyllines     Past Medical History  Diagnosis Date  . Allergic rhinitis   . Hx of adenomatous colonic polyps   . Pulmonary fibrosis   . COPD (chronic obstructive pulmonary disease)   . Diabetes mellitus type 2, insulin dependent   . GERD (gastroesophageal reflux disease)   . Erectile dysfunction   . Cardiomyopathy   . Hyperlipidemia   . Renal insufficiency   . Osteoarthritis   . Diabetes mellitus     Past Surgical History  Procedure Date  . Orif clavicle fracture     Left clavicle fracture- child  . Orif ulnar fracture     Left readius/ ulna fracture child  . Lung biopsy     Right lung bx. negative 2001  . Cataract extraction     Cataracts/ IOL OU, Ep[place] 2005, [place] ?  2000  .  Cardiovascular stress test     Myoview stress negative EF 65-70% 08/04    Family History  Problem Relation Age of Onset  . Cancer Neg Hx     History   Social History  . Marital Status: Married    Spouse Name: N/A    Number of Children: 3  . Years of Education: N/A   Occupational History  . retired     Ambulance person   Social History Main Topics  . Smoking status: Former Games developer  . Smokeless tobacco: Never Used  . Alcohol Use: Not on file  . Drug Use: Not on file  . Sexually Active: Not on file   Other Topics Concern  . Not on file   Social History Narrative   Has platonic lady lfriend since ~2008  [facility] seeing [facility]   Review of Systems Has some numbness along the inside of right leg---relates to pinched nerve in back Sleeps some with the temazepam---knee pain has interrupted him of late Appetite is okay    Objective:   Physical Exam  Constitutional: He appears well-developed and well-nourished. No distress.  Neck: Normal range of  motion. Neck supple. No thyromegaly present.  Cardiovascular: Normal rate, regular rhythm and normal heart sounds.  Exam reveals no gallop.   No murmur heard.      Faint pedal pulses  Pulmonary/Chest: Effort normal. No respiratory distress. He has no wheezes. He has no rales.       Slightly decreased breath sounds but clear  Lymphadenopathy:    He has no cervical adenopathy.  Skin:       Varicosities on feet but no ulcers  Psychiatric: He has a normal mood and affect. His behavior is normal. Judgment and thought content normal.          Assessment & Plan:

## 2011-06-11 DIAGNOSIS — J8410 Pulmonary fibrosis, unspecified: Secondary | ICD-10-CM | POA: Diagnosis not present

## 2011-06-11 DIAGNOSIS — J449 Chronic obstructive pulmonary disease, unspecified: Secondary | ICD-10-CM | POA: Diagnosis not present

## 2011-06-11 DIAGNOSIS — R0602 Shortness of breath: Secondary | ICD-10-CM | POA: Diagnosis not present

## 2011-06-11 DIAGNOSIS — J438 Other emphysema: Secondary | ICD-10-CM | POA: Diagnosis not present

## 2011-07-01 ENCOUNTER — Telehealth: Payer: Self-pay | Admitting: *Deleted

## 2011-07-01 NOTE — Telephone Encounter (Addendum)
Spoke with patient about medication Temazepam not being covered, pt would like prior auth done.

## 2011-07-01 NOTE — Telephone Encounter (Signed)
Prior auth form on your desk

## 2011-07-02 NOTE — Telephone Encounter (Signed)
Form faxed back.

## 2011-07-02 NOTE — Telephone Encounter (Signed)
I don't see it Does it require a call? If so, can state he has anxiety condition and stopping med would potentially cause serious adverse outcome

## 2011-07-03 ENCOUNTER — Other Ambulatory Visit: Payer: Self-pay | Admitting: Internal Medicine

## 2011-07-04 ENCOUNTER — Other Ambulatory Visit: Payer: Self-pay | Admitting: Internal Medicine

## 2011-07-04 DIAGNOSIS — I10 Essential (primary) hypertension: Secondary | ICD-10-CM

## 2011-07-08 ENCOUNTER — Other Ambulatory Visit (INDEPENDENT_AMBULATORY_CARE_PROVIDER_SITE_OTHER): Payer: Medicare Other

## 2011-07-08 DIAGNOSIS — I10 Essential (primary) hypertension: Secondary | ICD-10-CM | POA: Diagnosis not present

## 2011-07-08 LAB — BASIC METABOLIC PANEL
BUN: 30 mg/dL — ABNORMAL HIGH (ref 6–23)
Chloride: 104 mEq/L (ref 96–112)
Creatinine, Ser: 1.3 mg/dL (ref 0.4–1.5)
GFR: 56.78 mL/min — ABNORMAL LOW (ref >60.00)
Potassium: 4.4 mEq/L (ref 3.5–5.1)

## 2011-07-25 DIAGNOSIS — H40009 Preglaucoma, unspecified, unspecified eye: Secondary | ICD-10-CM | POA: Diagnosis not present

## 2011-07-28 ENCOUNTER — Other Ambulatory Visit: Payer: Self-pay | Admitting: *Deleted

## 2011-07-28 MED ORDER — POTASSIUM CHLORIDE CRYS ER 20 MEQ PO TBCR: 20.0000 meq | EXTENDED_RELEASE_TABLET | Freq: Every day | ORAL | Status: DC

## 2011-07-31 ENCOUNTER — Other Ambulatory Visit: Payer: Self-pay | Admitting: *Deleted

## 2011-08-01 MED ORDER — TEMAZEPAM 30 MG PO CAPS: 30.0000 mg | ORAL_CAPSULE | Freq: Every day | ORAL | Status: DC

## 2011-08-01 NOTE — Telephone Encounter (Signed)
rx called into pharmacy

## 2011-08-01 NOTE — Telephone Encounter (Signed)
Okay #30 x 1 

## 2011-08-14 DIAGNOSIS — R21 Rash and other nonspecific skin eruption: Secondary | ICD-10-CM | POA: Diagnosis not present

## 2011-09-06 ENCOUNTER — Other Ambulatory Visit: Payer: Self-pay | Admitting: Internal Medicine

## 2011-09-24 DIAGNOSIS — I82401 Acute embolism and thrombosis of unspecified deep veins of right lower extremity: Secondary | ICD-10-CM

## 2011-09-24 HISTORY — DX: Acute embolism and thrombosis of unspecified deep veins of right lower extremity: I82.401

## 2011-10-06 ENCOUNTER — Other Ambulatory Visit: Payer: Self-pay | Admitting: *Deleted

## 2011-10-06 MED ORDER — TEMAZEPAM 30 MG PO CAPS: 30.0000 mg | ORAL_CAPSULE | Freq: Every day | ORAL | Status: DC

## 2011-10-06 NOTE — Telephone Encounter (Signed)
Okay #30 x 1 

## 2011-10-06 NOTE — Telephone Encounter (Signed)
Faxed refill request   

## 2011-10-06 NOTE — Telephone Encounter (Signed)
rx called into pharmacy

## 2011-10-10 ENCOUNTER — Ambulatory Visit (INDEPENDENT_AMBULATORY_CARE_PROVIDER_SITE_OTHER): Payer: Medicare Other | Admitting: Internal Medicine

## 2011-10-10 ENCOUNTER — Ambulatory Visit: Payer: Self-pay | Admitting: Internal Medicine

## 2011-10-10 ENCOUNTER — Encounter: Payer: Self-pay | Admitting: Internal Medicine

## 2011-10-10 ENCOUNTER — Inpatient Hospital Stay: Payer: Self-pay | Admitting: Internal Medicine

## 2011-10-10 ENCOUNTER — Other Ambulatory Visit: Payer: Self-pay | Admitting: Internal Medicine

## 2011-10-10 VITALS — BP 112/60 | HR 91 | Temp 97.8°F | Wt 211.0 lb

## 2011-10-10 DIAGNOSIS — J8410 Pulmonary fibrosis, unspecified: Secondary | ICD-10-CM | POA: Diagnosis present

## 2011-10-10 DIAGNOSIS — J4489 Other specified chronic obstructive pulmonary disease: Secondary | ICD-10-CM | POA: Diagnosis not present

## 2011-10-10 DIAGNOSIS — K5909 Other constipation: Secondary | ICD-10-CM

## 2011-10-10 DIAGNOSIS — I428 Other cardiomyopathies: Secondary | ICD-10-CM | POA: Diagnosis present

## 2011-10-10 DIAGNOSIS — J449 Chronic obstructive pulmonary disease, unspecified: Secondary | ICD-10-CM | POA: Diagnosis not present

## 2011-10-10 DIAGNOSIS — Z9981 Dependence on supplemental oxygen: Secondary | ICD-10-CM | POA: Diagnosis not present

## 2011-10-10 DIAGNOSIS — K5900 Constipation, unspecified: Secondary | ICD-10-CM | POA: Diagnosis not present

## 2011-10-10 DIAGNOSIS — E119 Type 2 diabetes mellitus without complications: Secondary | ICD-10-CM | POA: Diagnosis not present

## 2011-10-10 DIAGNOSIS — I129 Hypertensive chronic kidney disease with stage 1 through stage 4 chronic kidney disease, or unspecified chronic kidney disease: Secondary | ICD-10-CM | POA: Diagnosis present

## 2011-10-10 DIAGNOSIS — I5022 Chronic systolic (congestive) heart failure: Secondary | ICD-10-CM | POA: Diagnosis present

## 2011-10-10 DIAGNOSIS — B356 Tinea cruris: Secondary | ICD-10-CM | POA: Insufficient documentation

## 2011-10-10 DIAGNOSIS — K219 Gastro-esophageal reflux disease without esophagitis: Secondary | ICD-10-CM | POA: Diagnosis present

## 2011-10-10 DIAGNOSIS — E785 Hyperlipidemia, unspecified: Secondary | ICD-10-CM | POA: Diagnosis not present

## 2011-10-10 DIAGNOSIS — Z7982 Long term (current) use of aspirin: Secondary | ICD-10-CM | POA: Diagnosis not present

## 2011-10-10 DIAGNOSIS — Z87891 Personal history of nicotine dependence: Secondary | ICD-10-CM | POA: Diagnosis not present

## 2011-10-10 DIAGNOSIS — M79609 Pain in unspecified limb: Secondary | ICD-10-CM

## 2011-10-10 DIAGNOSIS — N183 Chronic kidney disease, stage 3 unspecified: Secondary | ICD-10-CM | POA: Diagnosis not present

## 2011-10-10 DIAGNOSIS — Z833 Family history of diabetes mellitus: Secondary | ICD-10-CM | POA: Diagnosis not present

## 2011-10-10 DIAGNOSIS — M79604 Pain in right leg: Secondary | ICD-10-CM

## 2011-10-10 DIAGNOSIS — Z86718 Personal history of other venous thrombosis and embolism: Secondary | ICD-10-CM | POA: Diagnosis not present

## 2011-10-10 DIAGNOSIS — Z8781 Personal history of (healed) traumatic fracture: Secondary | ICD-10-CM | POA: Diagnosis not present

## 2011-10-10 DIAGNOSIS — I82819 Embolism and thrombosis of superficial veins of unspecified lower extremities: Secondary | ICD-10-CM | POA: Diagnosis present

## 2011-10-10 DIAGNOSIS — I509 Heart failure, unspecified: Secondary | ICD-10-CM | POA: Diagnosis present

## 2011-10-10 DIAGNOSIS — Z794 Long term (current) use of insulin: Secondary | ICD-10-CM | POA: Diagnosis not present

## 2011-10-10 DIAGNOSIS — I82409 Acute embolism and thrombosis of unspecified deep veins of unspecified lower extremity: Secondary | ICD-10-CM | POA: Diagnosis not present

## 2011-10-10 DIAGNOSIS — Z888 Allergy status to other drugs, medicaments and biological substances status: Secondary | ICD-10-CM | POA: Diagnosis not present

## 2011-10-10 DIAGNOSIS — Z79899 Other long term (current) drug therapy: Secondary | ICD-10-CM | POA: Diagnosis not present

## 2011-10-10 DIAGNOSIS — J9610 Chronic respiratory failure, unspecified whether with hypoxia or hypercapnia: Secondary | ICD-10-CM | POA: Diagnosis not present

## 2011-10-10 DIAGNOSIS — I824Y9 Acute embolism and thrombosis of unspecified deep veins of unspecified proximal lower extremity: Secondary | ICD-10-CM | POA: Diagnosis not present

## 2011-10-10 LAB — BASIC METABOLIC PANEL
Anion Gap: 9 (ref 7–16)
BUN: 30 mg/dL — ABNORMAL HIGH (ref 7–18)
Calcium, Total: 9 mg/dL (ref 8.5–10.1)
Chloride: 107 mmol/L (ref 98–107)
Co2: 27 mmol/L (ref 21–32)
Creatinine: 1.34 mg/dL — ABNORMAL HIGH (ref 0.60–1.30)
EGFR (African American): 57 — ABNORMAL LOW
Osmolality: 291 (ref 275–301)
Potassium: 4.1 mmol/L (ref 3.5–5.1)
Sodium: 143 mmol/L (ref 136–145)

## 2011-10-10 LAB — CBC
HGB: 15.8 g/dL (ref 13.0–18.0)
MCH: 33.1 pg (ref 26.0–34.0)
MCHC: 33.9 g/dL (ref 32.0–36.0)
Platelet: 191 10*3/uL (ref 150–440)
RDW: 13.1 % (ref 11.5–14.5)

## 2011-10-10 LAB — APTT: Activated PTT: 28 secs (ref 23.6–35.9)

## 2011-10-10 MED ORDER — KETOCONAZOLE 2 % EX CREA: TOPICAL_CREAM | Freq: Two times a day (BID) | CUTANEOUS | Status: DC

## 2011-10-10 NOTE — Assessment & Plan Note (Signed)
Will try ketoconazole cream

## 2011-10-10 NOTE — Assessment & Plan Note (Signed)
Very concerning for proximal DVT No symptoms to suggest PE Will send for duplex scan at [facility] Will need to be admitted if positive (could have iliac veins involved)

## 2011-10-10 NOTE — Progress Notes (Signed)
Subjective:    Patient ID: Miguel Huber, male    DOB: [birth year], 78 y.o.   MRN: [id number]  HPI "I am falling apart" Right leg is swollen from toes to groin Started Wednesday AM Some pain in distal thigh No known injury Pain with walking---now very limited due to the swelling  Constipated now Has some lower abdominal discomfort he relates to this Tried MOM and ex-lax and enema MOM did help a little but not emptying Appetite is fine  Asks for mycolog cream Has irritation in groin and scrotum  Current Outpatient Prescriptions on File Prior to Visit  Medication Sig Dispense Refill  . albuterol-ipratropium (COMBIVENT) 18-103 MCG/ACT inhaler Inhale 2 puffs into the lungs every 6 (six) hours as needed.        Marland Kitchen aspirin 81 MG tablet Take 81 mg by mouth daily.        . B-D INS SYRINGE 0.5CC/30GX1/2" 30G X 1/2" 0.5 ML MISC USE AS DIRECTED  100 each  11  . colchicine (COLCRYS) 0.6 MG tablet Take 1 tablet (0.6 mg total) by mouth 2 (two) times daily.  60 tablet  1  . Fluticasone-Salmeterol (ADVAIR DISKUS) 250-50 MCG/DOSE AEPB Inhale 1 puff into the lungs 2 (two) times daily.        . furosemide (LASIX) 80 MG tablet Take 1 tablet (80 mg total) by mouth daily.  30 tablet  11  . ipratropium-albuterol (DUONEB) 0.5-2.5 (3) MG/3ML SOLN Take 3 mLs by nebulization every 6 (six) hours as needed.       Marland Kitchen lisinopril (PRINIVIL,ZESTRIL) 10 MG tablet Take 1 tablet (10 mg total) by mouth daily.  90 tablet  3  . NOVOLIN N 100 UNIT/ML injection INJECT 25 UNITS SUBQ TWO TIMES A DAY  10 mL  11  . NOVOLIN R 100 UNIT/ML injection INJECT 10 UNITS TWO TIMES A DAY  10 mL  6  . omeprazole (PRILOSEC) 20 MG capsule TAKE ONE CAPSULE BY MOUTH EVERY DAY FOR ACID REFLUX  30 capsule  11  . potassium chloride SA (KLOR-CON M20) 20 MEQ tablet Take 1 tablet (20 mEq total) by mouth daily.  30 tablet  11  . simvastatin (ZOCOR) 20 MG tablet TAKE 1 TABLET BY MOUTH EVERY DAY  30 tablet  10  . temazepam (RESTORIL) 30 MG capsule  Take 1 capsule (30 mg total) by mouth at bedtime.  30 capsule  1  . traMADol (ULTRAM) 50 MG tablet Take 1-2 tablets (50-100 mg total) by mouth at bedtime as needed for pain.  60 tablet  1  . DISCONTD: omeprazole (PRILOSEC) 20 MG capsule Take 20 mg by mouth daily.          Allergies  Allergen Reactions  . Theophyllines     Past Medical History  Diagnosis Date  . Allergic rhinitis   . Hx of adenomatous colonic polyps   . Pulmonary fibrosis   . COPD (chronic obstructive pulmonary disease)   . Diabetes mellitus type 2, insulin dependent   . GERD (gastroesophageal reflux disease)   . Erectile dysfunction   . Cardiomyopathy   . Hyperlipidemia   . Renal insufficiency   . Osteoarthritis   . Diabetes mellitus   . Hypertension     Past Surgical History  Procedure Date  . Orif clavicle fracture     Left clavicle fracture- child  . Orif ulnar fracture     Left readius/ ulna fracture child  . Lung biopsy     Right lung bx. negative  2001  . Cataract extraction     Cataracts/ IOL OU, Epes 2005, [place] ?  2000  . Cardiovascular stress test     Myoview stress negative EF 65-70% 08/04    Family History  Problem Relation Age of Onset  . Cancer Neg Hx     History   Social History  . Marital Status: Married    Spouse Name: N/A    Number of Children: 3  . Years of Education: N/A   Occupational History  . retired     Ambulance person   Social History Main Topics  . Smoking status: Former Games developer  . Smokeless tobacco: Never Used  . Alcohol Use: Not on file  . Drug Use: Not on file  . Sexually Active: Not on file   Other Topics Concern  . Not on file   Social History Narrative   Has platonic lady lfriend since ~2008  [facility] seeing [facility]   Review of Systems Evaluated at walk in clinic for rash on right leg---given triamcinolone cream (?3 months ago) No fever No chest pain SOB is stable    Objective:   Physical Exam  Constitutional: He appears  well-developed and well-nourished. No distress.  Neck: Normal range of motion. Neck supple.  Cardiovascular: Normal rate, regular rhythm and normal heart sounds.  Exam reveals no gallop.   No murmur heard. Pulmonary/Chest: Effort normal. No respiratory distress. He has no wheezes. He has no rales.       Breath sounds slightly diminished but clear  Abdominal: There is no tenderness.  Genitourinary:       Redness on scrotum (dependent areas)  Musculoskeletal: He exhibits edema.       3+ swelling in entire right leg to groin No cords Mild tenderness along medial distal thigh  Lymphadenopathy:    He has no cervical adenopathy.          Assessment & Plan:

## 2011-10-10 NOTE — Patient Instructions (Signed)
Please try miralax 17gm (a capful) with full glass of water twice a day for the next few days. After you empty out, you can try taking it just once a day  Please go to [facility] for your leg test. If this shows a blood clot, you will need to go to the emergency room and be admitted for treatment Call for appt next week to recheck leg if there isn't a blood clot

## 2011-10-10 NOTE — Assessment & Plan Note (Signed)
Discussed using miralax Instructions given

## 2011-10-11 DIAGNOSIS — J9610 Chronic respiratory failure, unspecified whether with hypoxia or hypercapnia: Secondary | ICD-10-CM | POA: Diagnosis not present

## 2011-10-11 DIAGNOSIS — J449 Chronic obstructive pulmonary disease, unspecified: Secondary | ICD-10-CM | POA: Diagnosis not present

## 2011-10-11 DIAGNOSIS — E785 Hyperlipidemia, unspecified: Secondary | ICD-10-CM | POA: Diagnosis not present

## 2011-10-11 DIAGNOSIS — E119 Type 2 diabetes mellitus without complications: Secondary | ICD-10-CM | POA: Diagnosis not present

## 2011-10-11 DIAGNOSIS — I82409 Acute embolism and thrombosis of unspecified deep veins of unspecified lower extremity: Secondary | ICD-10-CM | POA: Diagnosis not present

## 2011-10-11 LAB — CBC WITH DIFFERENTIAL/PLATELET
Eosinophil #: 0.3 10*3/uL (ref 0.0–0.7)
HCT: 41.1 % (ref 40.0–52.0)
Lymphocyte %: 31.4 %
MCH: 33.2 pg (ref 26.0–34.0)
MCV: 97 fL (ref 80–100)
Monocyte %: 8.1 %
Neutrophil #: 5.2 10*3/uL (ref 1.4–6.5)
Neutrophil %: 56.9 %
Platelet: 184 10*3/uL (ref 150–440)
RDW: 12.9 % (ref 11.5–14.5)

## 2011-10-11 LAB — PROTIME-INR: Prothrombin Time: 13.9 secs (ref 11.5–14.7)

## 2011-10-11 LAB — APTT: Activated PTT: 82.6 secs — ABNORMAL HIGH (ref 23.6–35.9)

## 2011-10-11 LAB — BASIC METABOLIC PANEL
BUN: 29 mg/dL — ABNORMAL HIGH (ref 7–18)
Calcium, Total: 8.4 mg/dL — ABNORMAL LOW (ref 8.5–10.1)
Co2: 28 mmol/L (ref 21–32)
EGFR (African American): 59 — ABNORMAL LOW
EGFR (Non-African Amer.): 51 — ABNORMAL LOW

## 2011-10-12 DIAGNOSIS — E119 Type 2 diabetes mellitus without complications: Secondary | ICD-10-CM | POA: Diagnosis not present

## 2011-10-12 DIAGNOSIS — I82409 Acute embolism and thrombosis of unspecified deep veins of unspecified lower extremity: Secondary | ICD-10-CM | POA: Diagnosis not present

## 2011-10-12 DIAGNOSIS — J449 Chronic obstructive pulmonary disease, unspecified: Secondary | ICD-10-CM | POA: Diagnosis not present

## 2011-10-12 DIAGNOSIS — J9610 Chronic respiratory failure, unspecified whether with hypoxia or hypercapnia: Secondary | ICD-10-CM | POA: Diagnosis not present

## 2011-10-12 LAB — BASIC METABOLIC PANEL
BUN: 20 mg/dL — ABNORMAL HIGH (ref 7–18)
Calcium, Total: 8.3 mg/dL — ABNORMAL LOW (ref 8.5–10.1)
Chloride: 107 mmol/L (ref 98–107)
Co2: 26 mmol/L (ref 21–32)
EGFR (Non-African Amer.): >60
Osmolality: 287 (ref 275–301)
Potassium: 3.8 mmol/L (ref 3.5–5.1)
Sodium: 142 mmol/L (ref 136–145)

## 2011-10-12 LAB — CBC WITH DIFFERENTIAL/PLATELET
Basophil #: 0.1 10*3/uL (ref 0.0–0.1)
Basophil %: 0.6 %
Eosinophil #: 0.3 10*3/uL (ref 0.0–0.7)
HCT: 39 % — ABNORMAL LOW (ref 40.0–52.0)
HGB: 13.5 g/dL (ref 13.0–18.0)
Lymphocyte %: 25.2 %
MCV: 96 fL (ref 80–100)
Monocyte #: 0.7 x10 3/mm (ref 0.2–1.0)
Monocyte %: 7.4 %
Neutrophil %: 63.5 %
Platelet: 177 10*3/uL (ref 150–440)
RBC: 4.06 10*6/uL — ABNORMAL LOW (ref 4.40–5.90)
RDW: 12.9 % (ref 11.5–14.5)

## 2011-10-12 LAB — PROTIME-INR: INR: 1

## 2011-10-13 DIAGNOSIS — J9610 Chronic respiratory failure, unspecified whether with hypoxia or hypercapnia: Secondary | ICD-10-CM | POA: Diagnosis not present

## 2011-10-13 DIAGNOSIS — E119 Type 2 diabetes mellitus without complications: Secondary | ICD-10-CM | POA: Diagnosis not present

## 2011-10-13 DIAGNOSIS — I82409 Acute embolism and thrombosis of unspecified deep veins of unspecified lower extremity: Secondary | ICD-10-CM | POA: Diagnosis not present

## 2011-10-13 DIAGNOSIS — J449 Chronic obstructive pulmonary disease, unspecified: Secondary | ICD-10-CM | POA: Diagnosis not present

## 2011-10-13 LAB — APTT: Activated PTT: 79.8 secs — ABNORMAL HIGH (ref 23.6–35.9)

## 2011-10-14 DIAGNOSIS — I82409 Acute embolism and thrombosis of unspecified deep veins of unspecified lower extremity: Secondary | ICD-10-CM | POA: Diagnosis not present

## 2011-10-14 DIAGNOSIS — J9610 Chronic respiratory failure, unspecified whether with hypoxia or hypercapnia: Secondary | ICD-10-CM | POA: Diagnosis not present

## 2011-10-14 DIAGNOSIS — E119 Type 2 diabetes mellitus without complications: Secondary | ICD-10-CM | POA: Diagnosis not present

## 2011-10-14 DIAGNOSIS — J449 Chronic obstructive pulmonary disease, unspecified: Secondary | ICD-10-CM | POA: Diagnosis not present

## 2011-10-14 LAB — PROTIME-INR
INR: 1.2
Prothrombin Time: 15.8 secs — ABNORMAL HIGH (ref 11.5–14.7)

## 2011-10-14 LAB — PLATELET COUNT: Platelet: 210 10*3/uL (ref 150–440)

## 2011-10-14 LAB — HEMOGLOBIN: HGB: 13.7 g/dL (ref 13.0–18.0)

## 2011-10-15 ENCOUNTER — Telehealth: Payer: Self-pay

## 2011-10-15 ENCOUNTER — Telehealth: Payer: Self-pay | Admitting: *Deleted

## 2011-10-15 DIAGNOSIS — I428 Other cardiomyopathies: Secondary | ICD-10-CM | POA: Diagnosis not present

## 2011-10-15 DIAGNOSIS — I82409 Acute embolism and thrombosis of unspecified deep veins of unspecified lower extremity: Secondary | ICD-10-CM | POA: Diagnosis not present

## 2011-10-15 DIAGNOSIS — E119 Type 2 diabetes mellitus without complications: Secondary | ICD-10-CM | POA: Diagnosis not present

## 2011-10-15 DIAGNOSIS — J449 Chronic obstructive pulmonary disease, unspecified: Secondary | ICD-10-CM | POA: Diagnosis not present

## 2011-10-15 DIAGNOSIS — I10 Essential (primary) hypertension: Secondary | ICD-10-CM | POA: Diagnosis not present

## 2011-10-15 NOTE — Telephone Encounter (Signed)
Calling pt to ask him to come tomorrow for PT/INR and pt states that "some woman" supposed to come by and draw blood today, do you know who that is? And would it be for the same? Please advise  ( I don't see any hospital notes)

## 2011-10-15 NOTE — Telephone Encounter (Signed)
They may have set up home health to check protime---that is what he needed. No need to come here as well

## 2011-10-15 NOTE — Telephone Encounter (Signed)
Cheryl nurse with [facility] [phone] left v/m; pt discharged [facility] 10/14/11 on Lovenox and Coumadin 5 mg daily.Pt told not to take ASA. On discharge sheet ASA 81 mg listed on med list. Elnita Maxwell wants to clarify if pt should be taking ASA 81 mg.Please advise.

## 2011-10-15 NOTE — Telephone Encounter (Signed)
Yes pt set up with [facility], advised pt to have nurse send [place] her notes.

## 2011-10-16 NOTE — Telephone Encounter (Signed)
I will plan to restart the aspirin once he is off the lovenox and stable on the coumadin---so no aspirin till I see him next week

## 2011-10-16 NOTE — Telephone Encounter (Signed)
Left message with male to have Elnita Maxwell return call.

## 2011-10-17 ENCOUNTER — Telehealth: Payer: Self-pay

## 2011-10-17 DIAGNOSIS — E119 Type 2 diabetes mellitus without complications: Secondary | ICD-10-CM | POA: Diagnosis not present

## 2011-10-17 DIAGNOSIS — I10 Essential (primary) hypertension: Secondary | ICD-10-CM | POA: Diagnosis not present

## 2011-10-17 DIAGNOSIS — I428 Other cardiomyopathies: Secondary | ICD-10-CM | POA: Diagnosis not present

## 2011-10-17 DIAGNOSIS — I82409 Acute embolism and thrombosis of unspecified deep veins of unspecified lower extremity: Secondary | ICD-10-CM | POA: Diagnosis not present

## 2011-10-17 DIAGNOSIS — J449 Chronic obstructive pulmonary disease, unspecified: Secondary | ICD-10-CM | POA: Diagnosis not present

## 2011-10-17 NOTE — Telephone Encounter (Signed)
New DVT found 10/10/2011. Started on coumadin 10/11/2011.  Aspirin on hold.  On coumadin 5mg  daily. Discharged 10/14/2011. Has appt with PCP 10/21/2011. INR 2.0 today 10/17/2011 by [facility], plz notify ok to stop lovenox, continue coumadin at 5mg  daily.  Will recheck in office when pt comes for f/u.  Will route note to Dr. Elbert Ewings.

## 2011-10-17 NOTE — Telephone Encounter (Signed)
Larita Fife nurse with [facility] called PT 24.0 and INR 2.0; Patient presently taking Lovenox 95 mg [place] twice a day. Dr Alphonsus Sias standing order; INR between 2-3 stop Lovenox. Pt also taking Coumadin 5 mg daily. [facility]. Lynn request callback [phone] when need to recheck PT/INR and any dose change. Dr Alphonsus Sias not on computer.Please advise.

## 2011-10-17 NOTE — Telephone Encounter (Signed)
Message left advising Larita Fife of Dr. Timoteo Expose recommendations. Instructed to call back with any questions or concerns.

## 2011-10-18 NOTE — Telephone Encounter (Signed)
I agree with these recommendations Has appt on Tuesday

## 2011-10-20 DIAGNOSIS — J449 Chronic obstructive pulmonary disease, unspecified: Secondary | ICD-10-CM | POA: Diagnosis not present

## 2011-10-20 DIAGNOSIS — I82409 Acute embolism and thrombosis of unspecified deep veins of unspecified lower extremity: Secondary | ICD-10-CM | POA: Diagnosis not present

## 2011-10-20 DIAGNOSIS — E119 Type 2 diabetes mellitus without complications: Secondary | ICD-10-CM | POA: Diagnosis not present

## 2011-10-20 DIAGNOSIS — I428 Other cardiomyopathies: Secondary | ICD-10-CM | POA: Diagnosis not present

## 2011-10-20 DIAGNOSIS — I10 Essential (primary) hypertension: Secondary | ICD-10-CM | POA: Diagnosis not present

## 2011-10-21 ENCOUNTER — Ambulatory Visit (INDEPENDENT_AMBULATORY_CARE_PROVIDER_SITE_OTHER): Payer: Medicare Other | Admitting: Internal Medicine

## 2011-10-21 ENCOUNTER — Encounter: Payer: Self-pay | Admitting: Internal Medicine

## 2011-10-21 VITALS — BP 110/60 | HR 87 | Temp 97.5°F | Wt 211.0 lb

## 2011-10-21 DIAGNOSIS — I82409 Acute embolism and thrombosis of unspecified deep veins of unspecified lower extremity: Secondary | ICD-10-CM | POA: Diagnosis not present

## 2011-10-21 DIAGNOSIS — Z5181 Encounter for therapeutic drug level monitoring: Secondary | ICD-10-CM

## 2011-10-21 DIAGNOSIS — Z7901 Long term (current) use of anticoagulants: Secondary | ICD-10-CM

## 2011-10-21 DIAGNOSIS — I82401 Acute embolism and thrombosis of unspecified deep veins of right lower extremity: Secondary | ICD-10-CM

## 2011-10-21 LAB — POCT INR: INR: 2.2

## 2011-10-21 NOTE — Progress Notes (Signed)
Subjective:    Patient ID: Miguel Huber, male    DOB: Oct 06, 1927, 78 y.o.   MRN: [id number]  HPI Here with girlfriend again Feels the swelling may be some better but not great still INR was 2.0 last week so stopped the lovenox Still on warfarin  Nurses in hospital did eventually feel pulse in that foot Walking with rolling walker---close to back to baseline with that  Occ pain in thigh ---supine or walking at times  Current Outpatient Prescriptions on File Prior to Visit  Medication Sig Dispense Refill  . albuterol-ipratropium (COMBIVENT) 18-103 MCG/ACT inhaler Inhale 2 puffs into the lungs every 6 (six) hours as needed.        . B-D INS SYRINGE 0.5CC/30GX1/2" 30G X 1/2" 0.5 ML MISC USE AS DIRECTED  100 each  11  . colchicine (COLCRYS) 0.6 MG tablet Take 1 tablet (0.6 mg total) by mouth 2 (two) times daily.  60 tablet  1  . Fluticasone-Salmeterol (ADVAIR DISKUS) 250-50 MCG/DOSE AEPB Inhale 1 puff into the lungs 2 (two) times daily.        . furosemide (LASIX) 80 MG tablet Take 1 tablet (80 mg total) by mouth daily.  30 tablet  11  . ipratropium-albuterol (DUONEB) 0.5-2.5 (3) MG/3ML SOLN Take 3 mLs by nebulization every 6 (six) hours as needed.       Marland Kitchen ketoconazole (NIZORAL) 2 % cream Apply topically 2 (two) times daily. To groin rash till clear  60 g  1  . lisinopril (PRINIVIL,ZESTRIL) 10 MG tablet Take 1 tablet (10 mg total) by mouth daily.  90 tablet  3  . NOVOLIN N 100 UNIT/ML injection INJECT 25 UNITS SUBQ TWO TIMES A DAY  10 mL  11  . NOVOLIN R 100 UNIT/ML injection INJECT 10 UNITS TWO TIMES A DAY  10 mL  6  . omeprazole (PRILOSEC) 20 MG capsule TAKE ONE CAPSULE BY MOUTH EVERY DAY FOR ACID REFLUX  30 capsule  11  . polyethylene glycol (MIRALAX / GLYCOLAX) packet Take 17 g by mouth daily.      . potassium chloride SA (KLOR-CON M20) 20 MEQ tablet Take 1 tablet (20 mEq total) by mouth daily.  30 tablet  11  . simvastatin (ZOCOR) 20 MG tablet TAKE 1 TABLET BY MOUTH EVERY DAY  30  tablet  10  . temazepam (RESTORIL) 30 MG capsule Take 1 capsule (30 mg total) by mouth at bedtime.  30 capsule  1  . traMADol (ULTRAM) 50 MG tablet Take 1-2 tablets (50-100 mg total) by mouth at bedtime as needed for pain.  60 tablet  1  . warfarin (COUMADIN) 5 MG tablet Take 5 mg by mouth daily.         Allergies  Allergen Reactions  . Theophyllines     Past Medical History  Diagnosis Date  . Allergic rhinitis   . Hx of adenomatous colonic polyps   . Pulmonary fibrosis   . COPD (chronic obstructive pulmonary disease)   . Diabetes mellitus type 2, insulin dependent   . GERD (gastroesophageal reflux disease)   . Erectile dysfunction   . Cardiomyopathy   . Hyperlipidemia   . Renal insufficiency   . Osteoarthritis   . Diabetes mellitus   . Hypertension   . DVT (deep venous thrombosis), right 5/13    Past Surgical History  Procedure Date  . Orif clavicle fracture     Left clavicle fracture- child  . Orif ulnar fracture     Left readius/  ulna fracture child  . Lung biopsy     Right lung bx. negative 2001  . Cataract extraction     Cataracts/ IOL OU, Epes 2005, [place] ?  2000  . Cardiovascular stress test     Myoview stress negative EF 65-70% 08/04    Family History  Problem Relation Age of Onset  . Cancer Neg Hx     History   Social History  . Marital Status: Married    Spouse Name: N/A    Number of Children: 3  . Years of Education: N/A   Occupational History  . retired     Ambulance person   Social History Main Topics  . Smoking status: Former Games developer  . Smokeless tobacco: Never Used  . Alcohol Use: Not on file  . Drug Use: Not on file  . Sexually Active: Not on file   Other Topics Concern  . Not on file   Social History Narrative   Has platonic lady lfriend since ~2008  [facility] seeing [facility]   Review of Systems No chest pain Chronic dyspnea is stable Has some bruising on lower abdomen from the lovenox    Objective:   Physical Exam    Constitutional: He appears well-developed and well-nourished. No distress.  Neck: Normal range of motion.  Cardiovascular: Normal rate, regular rhythm and normal heart sounds.  Exam reveals no gallop.   No murmur heard. Pulmonary/Chest: Effort normal. No respiratory distress. He has no wheezes. He has no rales.  Musculoskeletal: He exhibits edema.       2-3+ edema in right calf and thigh No longer tense Clearly some improvement but swelling persists  Lymphadenopathy:    He has no cervical adenopathy.          Assessment & Plan:

## 2011-10-21 NOTE — Patient Instructions (Signed)
Continue 5 mg daily, re check 1 week

## 2011-10-21 NOTE — Telephone Encounter (Signed)
Patient coming in today for OV, will clarify then.

## 2011-10-21 NOTE — Assessment & Plan Note (Signed)
On coumadin and therapeutic Will treat for 6 months then reconsider No clear precipitating factors though  Check protime today

## 2011-10-22 DIAGNOSIS — R609 Edema, unspecified: Secondary | ICD-10-CM | POA: Diagnosis not present

## 2011-10-22 DIAGNOSIS — R809 Proteinuria, unspecified: Secondary | ICD-10-CM | POA: Diagnosis not present

## 2011-10-22 DIAGNOSIS — I10 Essential (primary) hypertension: Secondary | ICD-10-CM | POA: Diagnosis not present

## 2011-10-22 DIAGNOSIS — N2581 Secondary hyperparathyroidism of renal origin: Secondary | ICD-10-CM | POA: Diagnosis not present

## 2011-10-23 DIAGNOSIS — I82409 Acute embolism and thrombosis of unspecified deep veins of unspecified lower extremity: Secondary | ICD-10-CM | POA: Diagnosis not present

## 2011-10-23 DIAGNOSIS — E119 Type 2 diabetes mellitus without complications: Secondary | ICD-10-CM | POA: Diagnosis not present

## 2011-10-23 DIAGNOSIS — J449 Chronic obstructive pulmonary disease, unspecified: Secondary | ICD-10-CM | POA: Diagnosis not present

## 2011-10-23 DIAGNOSIS — M533 Sacrococcygeal disorders, not elsewhere classified: Secondary | ICD-10-CM | POA: Diagnosis not present

## 2011-10-23 DIAGNOSIS — M999 Biomechanical lesion, unspecified: Secondary | ICD-10-CM | POA: Diagnosis not present

## 2011-10-23 DIAGNOSIS — M5430 Sciatica, unspecified side: Secondary | ICD-10-CM | POA: Diagnosis not present

## 2011-10-23 DIAGNOSIS — I428 Other cardiomyopathies: Secondary | ICD-10-CM | POA: Diagnosis not present

## 2011-10-23 DIAGNOSIS — I10 Essential (primary) hypertension: Secondary | ICD-10-CM | POA: Diagnosis not present

## 2011-10-27 DIAGNOSIS — E119 Type 2 diabetes mellitus without complications: Secondary | ICD-10-CM | POA: Diagnosis not present

## 2011-10-27 DIAGNOSIS — J449 Chronic obstructive pulmonary disease, unspecified: Secondary | ICD-10-CM | POA: Diagnosis not present

## 2011-10-27 DIAGNOSIS — I428 Other cardiomyopathies: Secondary | ICD-10-CM | POA: Diagnosis not present

## 2011-10-27 DIAGNOSIS — I82409 Acute embolism and thrombosis of unspecified deep veins of unspecified lower extremity: Secondary | ICD-10-CM | POA: Diagnosis not present

## 2011-10-27 DIAGNOSIS — I10 Essential (primary) hypertension: Secondary | ICD-10-CM | POA: Diagnosis not present

## 2011-10-28 ENCOUNTER — Other Ambulatory Visit: Payer: Self-pay | Admitting: Internal Medicine

## 2011-10-28 ENCOUNTER — Ambulatory Visit (INDEPENDENT_AMBULATORY_CARE_PROVIDER_SITE_OTHER): Payer: Medicare Other | Admitting: Internal Medicine

## 2011-10-28 DIAGNOSIS — I428 Other cardiomyopathies: Secondary | ICD-10-CM

## 2011-10-28 DIAGNOSIS — I82401 Acute embolism and thrombosis of unspecified deep veins of right lower extremity: Secondary | ICD-10-CM

## 2011-10-28 DIAGNOSIS — Z5181 Encounter for therapeutic drug level monitoring: Secondary | ICD-10-CM | POA: Diagnosis not present

## 2011-10-28 DIAGNOSIS — I82409 Acute embolism and thrombosis of unspecified deep veins of unspecified lower extremity: Secondary | ICD-10-CM | POA: Diagnosis not present

## 2011-10-28 DIAGNOSIS — E119 Type 2 diabetes mellitus without complications: Secondary | ICD-10-CM

## 2011-10-28 DIAGNOSIS — J449 Chronic obstructive pulmonary disease, unspecified: Secondary | ICD-10-CM

## 2011-10-28 DIAGNOSIS — J4489 Other specified chronic obstructive pulmonary disease: Secondary | ICD-10-CM

## 2011-10-28 DIAGNOSIS — Z7901 Long term (current) use of anticoagulants: Secondary | ICD-10-CM

## 2011-10-28 LAB — POCT INR: INR: 2.2

## 2011-10-28 NOTE — Patient Instructions (Signed)
Continue current dose, check in 2 weeks  

## 2011-10-30 DIAGNOSIS — J449 Chronic obstructive pulmonary disease, unspecified: Secondary | ICD-10-CM | POA: Diagnosis not present

## 2011-10-30 DIAGNOSIS — M7989 Other specified soft tissue disorders: Secondary | ICD-10-CM | POA: Diagnosis not present

## 2011-10-30 DIAGNOSIS — I872 Venous insufficiency (chronic) (peripheral): Secondary | ICD-10-CM | POA: Diagnosis not present

## 2011-10-30 DIAGNOSIS — M79609 Pain in unspecified limb: Secondary | ICD-10-CM | POA: Diagnosis not present

## 2011-10-31 ENCOUNTER — Ambulatory Visit: Payer: Self-pay | Admitting: Internal Medicine

## 2011-10-31 DIAGNOSIS — E119 Type 2 diabetes mellitus without complications: Secondary | ICD-10-CM | POA: Diagnosis not present

## 2011-10-31 DIAGNOSIS — Z79899 Other long term (current) drug therapy: Secondary | ICD-10-CM | POA: Diagnosis not present

## 2011-10-31 DIAGNOSIS — I428 Other cardiomyopathies: Secondary | ICD-10-CM | POA: Diagnosis not present

## 2011-10-31 DIAGNOSIS — Z7901 Long term (current) use of anticoagulants: Secondary | ICD-10-CM | POA: Diagnosis not present

## 2011-10-31 DIAGNOSIS — I824Y9 Acute embolism and thrombosis of unspecified deep veins of unspecified proximal lower extremity: Secondary | ICD-10-CM | POA: Diagnosis not present

## 2011-10-31 DIAGNOSIS — N183 Chronic kidney disease, stage 3 unspecified: Secondary | ICD-10-CM | POA: Diagnosis not present

## 2011-10-31 DIAGNOSIS — K219 Gastro-esophageal reflux disease without esophagitis: Secondary | ICD-10-CM | POA: Diagnosis not present

## 2011-10-31 DIAGNOSIS — J8410 Pulmonary fibrosis, unspecified: Secondary | ICD-10-CM | POA: Diagnosis not present

## 2011-10-31 DIAGNOSIS — Z9981 Dependence on supplemental oxygen: Secondary | ICD-10-CM | POA: Diagnosis not present

## 2011-10-31 DIAGNOSIS — I129 Hypertensive chronic kidney disease with stage 1 through stage 4 chronic kidney disease, or unspecified chronic kidney disease: Secondary | ICD-10-CM | POA: Diagnosis not present

## 2011-10-31 DIAGNOSIS — J449 Chronic obstructive pulmonary disease, unspecified: Secondary | ICD-10-CM | POA: Diagnosis not present

## 2011-11-03 ENCOUNTER — Other Ambulatory Visit: Payer: Self-pay | Admitting: *Deleted

## 2011-11-03 DIAGNOSIS — J449 Chronic obstructive pulmonary disease, unspecified: Secondary | ICD-10-CM | POA: Diagnosis not present

## 2011-11-03 DIAGNOSIS — E119 Type 2 diabetes mellitus without complications: Secondary | ICD-10-CM | POA: Diagnosis not present

## 2011-11-03 DIAGNOSIS — I428 Other cardiomyopathies: Secondary | ICD-10-CM | POA: Diagnosis not present

## 2011-11-03 DIAGNOSIS — I82409 Acute embolism and thrombosis of unspecified deep veins of unspecified lower extremity: Secondary | ICD-10-CM | POA: Diagnosis not present

## 2011-11-03 DIAGNOSIS — I10 Essential (primary) hypertension: Secondary | ICD-10-CM | POA: Diagnosis not present

## 2011-11-03 MED ORDER — WARFARIN SODIUM 5 MG PO TABS: 5.0000 mg | ORAL_TABLET | Freq: Every day | ORAL | Status: DC

## 2011-11-06 ENCOUNTER — Ambulatory Visit: Payer: Self-pay | Admitting: Nephrology

## 2011-11-06 DIAGNOSIS — N183 Chronic kidney disease, stage 3 unspecified: Secondary | ICD-10-CM | POA: Diagnosis not present

## 2011-11-06 DIAGNOSIS — Q619 Cystic kidney disease, unspecified: Secondary | ICD-10-CM | POA: Diagnosis not present

## 2011-11-06 DIAGNOSIS — N269 Renal sclerosis, unspecified: Secondary | ICD-10-CM | POA: Diagnosis not present

## 2011-11-07 DIAGNOSIS — E119 Type 2 diabetes mellitus without complications: Secondary | ICD-10-CM | POA: Diagnosis not present

## 2011-11-07 DIAGNOSIS — J449 Chronic obstructive pulmonary disease, unspecified: Secondary | ICD-10-CM | POA: Diagnosis not present

## 2011-11-07 DIAGNOSIS — I82409 Acute embolism and thrombosis of unspecified deep veins of unspecified lower extremity: Secondary | ICD-10-CM | POA: Diagnosis not present

## 2011-11-07 DIAGNOSIS — I10 Essential (primary) hypertension: Secondary | ICD-10-CM | POA: Diagnosis not present

## 2011-11-07 DIAGNOSIS — I428 Other cardiomyopathies: Secondary | ICD-10-CM | POA: Diagnosis not present

## 2011-11-10 DIAGNOSIS — J8410 Pulmonary fibrosis, unspecified: Secondary | ICD-10-CM | POA: Diagnosis not present

## 2011-11-10 DIAGNOSIS — R0602 Shortness of breath: Secondary | ICD-10-CM | POA: Diagnosis not present

## 2011-11-10 DIAGNOSIS — J438 Other emphysema: Secondary | ICD-10-CM | POA: Diagnosis not present

## 2011-11-10 DIAGNOSIS — J449 Chronic obstructive pulmonary disease, unspecified: Secondary | ICD-10-CM | POA: Diagnosis not present

## 2011-11-11 ENCOUNTER — Ambulatory Visit (INDEPENDENT_AMBULATORY_CARE_PROVIDER_SITE_OTHER): Payer: Medicare Other | Admitting: Internal Medicine

## 2011-11-11 DIAGNOSIS — Z7901 Long term (current) use of anticoagulants: Secondary | ICD-10-CM

## 2011-11-11 DIAGNOSIS — Z5181 Encounter for therapeutic drug level monitoring: Secondary | ICD-10-CM

## 2011-11-11 DIAGNOSIS — I82409 Acute embolism and thrombosis of unspecified deep veins of unspecified lower extremity: Secondary | ICD-10-CM

## 2011-11-11 DIAGNOSIS — I82401 Acute embolism and thrombosis of unspecified deep veins of right lower extremity: Secondary | ICD-10-CM

## 2011-11-11 NOTE — Patient Instructions (Signed)
Continue current dose, check in 4 weeks  

## 2011-11-12 DIAGNOSIS — E119 Type 2 diabetes mellitus without complications: Secondary | ICD-10-CM | POA: Diagnosis not present

## 2011-11-12 DIAGNOSIS — I82409 Acute embolism and thrombosis of unspecified deep veins of unspecified lower extremity: Secondary | ICD-10-CM | POA: Diagnosis not present

## 2011-11-12 DIAGNOSIS — I428 Other cardiomyopathies: Secondary | ICD-10-CM | POA: Diagnosis not present

## 2011-11-12 DIAGNOSIS — I10 Essential (primary) hypertension: Secondary | ICD-10-CM | POA: Diagnosis not present

## 2011-11-12 DIAGNOSIS — J449 Chronic obstructive pulmonary disease, unspecified: Secondary | ICD-10-CM | POA: Diagnosis not present

## 2011-11-18 DIAGNOSIS — L570 Actinic keratosis: Secondary | ICD-10-CM | POA: Diagnosis not present

## 2011-11-18 DIAGNOSIS — Z85828 Personal history of other malignant neoplasm of skin: Secondary | ICD-10-CM | POA: Diagnosis not present

## 2011-11-18 DIAGNOSIS — D0460 Carcinoma in situ of skin of unspecified upper limb, including shoulder: Secondary | ICD-10-CM | POA: Diagnosis not present

## 2011-11-18 DIAGNOSIS — D485 Neoplasm of uncertain behavior of skin: Secondary | ICD-10-CM | POA: Diagnosis not present

## 2011-11-18 DIAGNOSIS — C44611 Basal cell carcinoma of skin of unspecified upper limb, including shoulder: Secondary | ICD-10-CM | POA: Diagnosis not present

## 2011-11-21 DIAGNOSIS — I10 Essential (primary) hypertension: Secondary | ICD-10-CM | POA: Diagnosis not present

## 2011-11-21 DIAGNOSIS — R809 Proteinuria, unspecified: Secondary | ICD-10-CM | POA: Diagnosis not present

## 2011-11-21 DIAGNOSIS — N2581 Secondary hyperparathyroidism of renal origin: Secondary | ICD-10-CM | POA: Diagnosis not present

## 2011-11-24 ENCOUNTER — Encounter: Payer: Self-pay | Admitting: Internal Medicine

## 2011-11-24 ENCOUNTER — Ambulatory Visit: Payer: Self-pay | Admitting: Internal Medicine

## 2011-11-24 ENCOUNTER — Ambulatory Visit (INDEPENDENT_AMBULATORY_CARE_PROVIDER_SITE_OTHER): Payer: Medicare Other | Admitting: Internal Medicine

## 2011-11-24 VITALS — BP 120/70 | HR 83 | Temp 97.5°F | Ht 67.0 in | Wt 208.0 lb

## 2011-11-24 DIAGNOSIS — N058 Unspecified nephritic syndrome with other morphologic changes: Secondary | ICD-10-CM | POA: Diagnosis not present

## 2011-11-24 DIAGNOSIS — E1129 Type 2 diabetes mellitus with other diabetic kidney complication: Secondary | ICD-10-CM | POA: Diagnosis not present

## 2011-11-24 DIAGNOSIS — I82409 Acute embolism and thrombosis of unspecified deep veins of unspecified lower extremity: Secondary | ICD-10-CM | POA: Diagnosis not present

## 2011-11-24 DIAGNOSIS — N259 Disorder resulting from impaired renal tubular function, unspecified: Secondary | ICD-10-CM | POA: Diagnosis not present

## 2011-11-24 DIAGNOSIS — R7989 Other specified abnormal findings of blood chemistry: Secondary | ICD-10-CM | POA: Diagnosis not present

## 2011-11-24 DIAGNOSIS — E785 Hyperlipidemia, unspecified: Secondary | ICD-10-CM | POA: Diagnosis not present

## 2011-11-24 DIAGNOSIS — R799 Abnormal finding of blood chemistry, unspecified: Secondary | ICD-10-CM

## 2011-11-24 DIAGNOSIS — J4489 Other specified chronic obstructive pulmonary disease: Secondary | ICD-10-CM

## 2011-11-24 DIAGNOSIS — J449 Chronic obstructive pulmonary disease, unspecified: Secondary | ICD-10-CM

## 2011-11-24 LAB — HEPATIC FUNCTION PANEL
ALT: 20 U/L (ref 0–53)
AST: 19 U/L (ref 0–37)
Albumin: 3.8 g/dL (ref 3.5–5.2)
Total Bilirubin: 0.8 mg/dL (ref 0.3–1.2)

## 2011-11-24 LAB — LIPID PANEL
Cholesterol: 112 mg/dL (ref 0–200)
Triglycerides: 133 mg/dL (ref 0.0–149.0)

## 2011-11-24 LAB — CBC WITH DIFFERENTIAL/PLATELET
Eosinophils Absolute: 0.2 10*3/uL (ref 0.0–0.7)
HCT: 43.6 % (ref 39.0–52.0)
Lymphs Abs: 1.9 10*3/uL (ref 0.7–4.0)
MCHC: 34 g/dL (ref 30.0–36.0)
MCV: 95.5 fl (ref 78.0–100.0)
Monocytes Absolute: 0.8 10*3/uL (ref 0.1–1.0)
Neutrophils Relative %: 62.8 % (ref 43.0–77.0)
Platelets: 216 10*3/uL (ref 150.0–400.0)

## 2011-11-24 LAB — BASIC METABOLIC PANEL
BUN: 30 mg/dL — ABNORMAL HIGH (ref 6–23)
CO2: 29 mEq/L (ref 19–32)
Chloride: 107 mEq/L (ref 96–112)
Creatinine, Ser: 1.3 mg/dL (ref 0.4–1.5)
Glucose, Bld: 135 mg/dL — ABNORMAL HIGH (ref 70–99)
Potassium: 3.9 mEq/L (ref 3.5–5.1)

## 2011-11-24 LAB — TSH: TSH: 1.09 u[IU]/mL (ref 0.35–5.50)

## 2011-11-24 LAB — HEMOGLOBIN A1C: Hgb A1c MFr Bld: 7.4 % — ABNORMAL HIGH (ref 4.6–6.5)

## 2011-11-24 NOTE — Assessment & Plan Note (Signed)
No problems with med Due for labs 

## 2011-11-24 NOTE — Assessment & Plan Note (Signed)
Swelling mostly gone No tenderness Will plan 6 months of coumadin---then reevaluation

## 2011-11-24 NOTE — Assessment & Plan Note (Signed)
Now on lisinopril Will stop the potassium Follows now with Dr Wynelle Link

## 2011-11-24 NOTE — Assessment & Plan Note (Signed)
Still seems to have reasonable control Will check labs

## 2011-11-24 NOTE — Progress Notes (Signed)
Subjective:    Patient ID: Miguel Huber, male    DOB: 05-02-1928, 78 y.o.   MRN: [id number]  HPI Here with girlfriend  Swelling in right leg is basically gone Has occ medial upper right thigh pain Getting around okay with rolling walker  Checks sugars twice a day Average of 150 ---before breakfast and supper Still uses 25N/10R bid still Occ gets mild low sugar reactions (shaky hands) if he goes too long before dinner---will just eat slight chocolate and averts major symptoms  No SOB Had one episode of chest pain about 8 days ago---after coming home from church Relates to too many doctor appts Hopes to be done with vein doctor and Dr Wynelle Link (nephrologist) Discussed that ongoing follow up would be good He asked him to stop the potassium now that he is on lisinopril  No problems with the statin No myalgias No GI side effects  Current Outpatient Prescriptions on File Prior to Visit  Medication Sig Dispense Refill  . albuterol-ipratropium (COMBIVENT) 18-103 MCG/ACT inhaler Inhale 2 puffs into the lungs every 6 (six) hours as needed.        . B-D INS SYRINGE 0.5CC/30GX1/2" 30G X 1/2" 0.5 ML MISC USE AS DIRECTED  100 each  11  . colchicine (COLCRYS) 0.6 MG tablet Take 1 tablet (0.6 mg total) by mouth 2 (two) times daily.  60 tablet  1  . Fluticasone-Salmeterol (ADVAIR DISKUS) 250-50 MCG/DOSE AEPB Inhale 1 puff into the lungs 2 (two) times daily.        . furosemide (LASIX) 80 MG tablet Take 1 tablet (80 mg total) by mouth daily.  30 tablet  11  . ipratropium-albuterol (DUONEB) 0.5-2.5 (3) MG/3ML SOLN USE 1 VIAL IN NEBULIZER EVERY 6 HOURS WHILE AWAKE  270 mL  0  . ketoconazole (NIZORAL) 2 % cream Apply topically 2 (two) times daily. To groin rash till clear  60 g  1  . lisinopril (PRINIVIL,ZESTRIL) 10 MG tablet Take 1 tablet (10 mg total) by mouth daily.  90 tablet  3  . NOVOLIN N 100 UNIT/ML injection INJECT 25 UNITS SUBQ TWO TIMES A DAY  10 mL  11  . NOVOLIN R 100 UNIT/ML  injection INJECT 10 UNITS TWO TIMES A DAY  10 mL  6  . omeprazole (PRILOSEC) 20 MG capsule TAKE ONE CAPSULE BY MOUTH EVERY DAY FOR ACID REFLUX  30 capsule  11  . polyethylene glycol (MIRALAX / GLYCOLAX) packet Take 17 g by mouth daily.      . potassium chloride SA (KLOR-CON M20) 20 MEQ tablet Take 1 tablet (20 mEq total) by mouth daily.  30 tablet  11  . simvastatin (ZOCOR) 20 MG tablet TAKE 1 TABLET BY MOUTH EVERY DAY  30 tablet  10  . temazepam (RESTORIL) 30 MG capsule Take 1 capsule (30 mg total) by mouth at bedtime.  30 capsule  1  . traMADol (ULTRAM) 50 MG tablet Take 1-2 tablets (50-100 mg total) by mouth at bedtime as needed for pain.  60 tablet  1  . warfarin (COUMADIN) 5 MG tablet Take 1 tablet (5 mg total) by mouth daily.  30 tablet  6    Allergies  Allergen Reactions  . Theophyllines     Past Medical History  Diagnosis Date  . Allergic rhinitis   . Hx of adenomatous colonic polyps   . Pulmonary fibrosis   . COPD (chronic obstructive pulmonary disease)   . Diabetes mellitus type 2, insulin dependent   . GERD (  gastroesophageal reflux disease)   . Erectile dysfunction   . Cardiomyopathy   . Hyperlipidemia   . Renal insufficiency   . Osteoarthritis   . Diabetes mellitus   . Hypertension   . DVT (deep venous thrombosis), right 5/13    Past Surgical History  Procedure Date  . Orif clavicle fracture     Left clavicle fracture- child  . Orif ulnar fracture     Left readius/ ulna fracture child  . Lung biopsy     Right lung bx. negative 2001  . Cataract extraction     Cataracts/ IOL OU, [place] 2005, [place] ?  2000  . Cardiovascular stress test     Myoview stress negative EF 65-70% 08/04    Family History  Problem Relation Age of Onset  . Cancer Neg Hx     History   Social History  . Marital Status: Married    Spouse Name: N/A    Number of Children: 3  . Years of Education: N/A   Occupational History  . retired     Ambulance person   Social History Main  Topics  . Smoking status: Former Games developer  . Smokeless tobacco: Never Used  . Alcohol Use: Not on file  . Drug Use: Not on file  . Sexually Active: Not on file   Other Topics Concern  . Not on file   Social History Narrative   Has platonic lady lfriend since ~2008  [facility] seeing Corrie Dandy   Review of Systems Sleeps okay ---still up from nocturia Appetite is good Weight down 3#     Objective:   Physical Exam  Constitutional: He appears well-developed and well-nourished. No distress.  Neck: Normal range of motion. Neck supple.  Cardiovascular: Normal rate, regular rhythm and normal heart sounds.  Exam reveals no gallop.   No murmur heard. Pulmonary/Chest: Effort normal and breath sounds normal. No respiratory distress. He has no wheezes. He has no rales.  Musculoskeletal: He exhibits edema.       Mild swelling in right calf without pitting----much improved Has support sock there  Lymphadenopathy:    He has no cervical adenopathy.  Skin:       No foot lesions  Psychiatric: He has a normal mood and affect. His behavior is normal.          Assessment & Plan:

## 2011-11-24 NOTE — Assessment & Plan Note (Signed)
resp status stable

## 2011-11-24 NOTE — Addendum Note (Signed)
Addended by: Tillman Abide I on: 11/24/2011 12:09 PM   Modules accepted: Orders

## 2011-11-25 ENCOUNTER — Other Ambulatory Visit: Payer: Self-pay | Admitting: Internal Medicine

## 2011-12-02 ENCOUNTER — Encounter: Payer: Self-pay | Admitting: *Deleted

## 2011-12-03 ENCOUNTER — Other Ambulatory Visit: Payer: Self-pay | Admitting: *Deleted

## 2011-12-04 MED ORDER — TEMAZEPAM 30 MG PO CAPS: 30.0000 mg | ORAL_CAPSULE | Freq: Every day | ORAL | Status: DC

## 2011-12-04 NOTE — Telephone Encounter (Signed)
Okay #30 x 1 

## 2011-12-04 NOTE — Telephone Encounter (Signed)
rx called into pharmacy

## 2011-12-05 ENCOUNTER — Ambulatory Visit: Payer: Medicare Other | Admitting: Internal Medicine

## 2011-12-09 ENCOUNTER — Ambulatory Visit (INDEPENDENT_AMBULATORY_CARE_PROVIDER_SITE_OTHER): Payer: Medicare Other | Admitting: Internal Medicine

## 2011-12-09 DIAGNOSIS — I82409 Acute embolism and thrombosis of unspecified deep veins of unspecified lower extremity: Secondary | ICD-10-CM

## 2011-12-09 DIAGNOSIS — Z7901 Long term (current) use of anticoagulants: Secondary | ICD-10-CM | POA: Diagnosis not present

## 2011-12-09 DIAGNOSIS — Z5181 Encounter for therapeutic drug level monitoring: Secondary | ICD-10-CM

## 2011-12-09 DIAGNOSIS — I82401 Acute embolism and thrombosis of unspecified deep veins of right lower extremity: Secondary | ICD-10-CM

## 2011-12-09 LAB — POCT INR: INR: 2.1

## 2011-12-09 NOTE — Patient Instructions (Signed)
Continue current dose, check in 4 weeks  

## 2011-12-18 ENCOUNTER — Ambulatory Visit: Payer: Self-pay | Admitting: Internal Medicine

## 2011-12-18 DIAGNOSIS — J8410 Pulmonary fibrosis, unspecified: Secondary | ICD-10-CM | POA: Diagnosis not present

## 2011-12-18 DIAGNOSIS — J441 Chronic obstructive pulmonary disease with (acute) exacerbation: Secondary | ICD-10-CM | POA: Diagnosis not present

## 2011-12-18 DIAGNOSIS — R05 Cough: Secondary | ICD-10-CM | POA: Diagnosis not present

## 2011-12-23 ENCOUNTER — Telehealth: Payer: Self-pay | Admitting: Internal Medicine

## 2011-12-23 NOTE — Telephone Encounter (Signed)
LMOVM to return call.

## 2011-12-23 NOTE — Telephone Encounter (Signed)
Caller: Samir/Patient; PCP: Tillman Abide; CB#: [phone]; ; ; Call regarding Requesting Message Be Sent To Dr. Alphonsus Sias;   PATIENT REQUSTING A MESSAGE BE SENT TO DR. 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 PATIENT REQUESTING OPINION OF DR. Alphonsus Sias REGARDING THE SURGERY AND ADVISE REGARDING COUMADIN DOSAGE PRIOR TO SURGERY. PATIENT STATES SURGERY IS SCHEDULED FOR 12/31/11 AND 01/14/12. PATIENT CAN BE REACHED AT [phone] OR [phone].

## 2011-12-23 NOTE — Telephone Encounter (Signed)
Please call him I would not recommend stopping the coumadin as his blood clot was fairly recently  Often, skin surgery can be done with someone still on coumadin He should check with the dermatologist It would be okay to hold for 2 days before the procedure to reduce the chance of bleeding with the surgery if the dermatologist feels that is important

## 2011-12-23 NOTE — Telephone Encounter (Signed)
Patient advised, reached on mobile phone.

## 2011-12-25 DIAGNOSIS — R05 Cough: Secondary | ICD-10-CM | POA: Diagnosis not present

## 2011-12-25 DIAGNOSIS — J441 Chronic obstructive pulmonary disease with (acute) exacerbation: Secondary | ICD-10-CM | POA: Diagnosis not present

## 2011-12-31 DIAGNOSIS — C44611 Basal cell carcinoma of skin of unspecified upper limb, including shoulder: Secondary | ICD-10-CM | POA: Diagnosis not present

## 2012-01-06 ENCOUNTER — Ambulatory Visit (INDEPENDENT_AMBULATORY_CARE_PROVIDER_SITE_OTHER): Payer: Medicare Other | Admitting: Internal Medicine

## 2012-01-06 DIAGNOSIS — I82409 Acute embolism and thrombosis of unspecified deep veins of unspecified lower extremity: Secondary | ICD-10-CM | POA: Diagnosis not present

## 2012-01-06 DIAGNOSIS — T8140XA Infection following a procedure, unspecified, initial encounter: Secondary | ICD-10-CM | POA: Diagnosis not present

## 2012-01-06 LAB — POCT INR: INR: 1.7

## 2012-01-06 NOTE — Patient Instructions (Signed)
Continue 5 mg daily, except 7.5 mg every Tues. Check in 2 weeks

## 2012-01-13 DIAGNOSIS — M5380 Other specified dorsopathies, site unspecified: Secondary | ICD-10-CM | POA: Diagnosis not present

## 2012-01-13 DIAGNOSIS — M533 Sacrococcygeal disorders, not elsewhere classified: Secondary | ICD-10-CM | POA: Diagnosis not present

## 2012-01-13 DIAGNOSIS — M999 Biomechanical lesion, unspecified: Secondary | ICD-10-CM | POA: Diagnosis not present

## 2012-01-14 ENCOUNTER — Other Ambulatory Visit: Payer: Self-pay | Admitting: Internal Medicine

## 2012-01-14 DIAGNOSIS — D0460 Carcinoma in situ of skin of unspecified upper limb, including shoulder: Secondary | ICD-10-CM | POA: Diagnosis not present

## 2012-01-20 ENCOUNTER — Ambulatory Visit (INDEPENDENT_AMBULATORY_CARE_PROVIDER_SITE_OTHER): Payer: Medicare Other | Admitting: Internal Medicine

## 2012-01-20 DIAGNOSIS — Z7901 Long term (current) use of anticoagulants: Secondary | ICD-10-CM | POA: Diagnosis not present

## 2012-01-20 DIAGNOSIS — I82401 Acute embolism and thrombosis of unspecified deep veins of right lower extremity: Secondary | ICD-10-CM

## 2012-01-20 DIAGNOSIS — Z5181 Encounter for therapeutic drug level monitoring: Secondary | ICD-10-CM | POA: Diagnosis not present

## 2012-01-20 DIAGNOSIS — I82409 Acute embolism and thrombosis of unspecified deep veins of unspecified lower extremity: Secondary | ICD-10-CM

## 2012-01-20 LAB — POCT INR: INR: 1.9

## 2012-01-20 NOTE — Patient Instructions (Addendum)
Continue 5 mg daily, except 7.5 mg every Tues, recheck 4 weeks

## 2012-01-22 DIAGNOSIS — H40009 Preglaucoma, unspecified, unspecified eye: Secondary | ICD-10-CM | POA: Diagnosis not present

## 2012-01-30 ENCOUNTER — Other Ambulatory Visit: Payer: Self-pay | Admitting: *Deleted

## 2012-01-30 MED ORDER — TEMAZEPAM 30 MG PO CAPS: 30.0000 mg | ORAL_CAPSULE | Freq: Every day | ORAL | Status: DC

## 2012-01-30 NOTE — Telephone Encounter (Signed)
rx called into pharmacy, using my personal cell phone, phone lines are down and can't call out.

## 2012-01-30 NOTE — Telephone Encounter (Signed)
plz phone in. 

## 2012-01-30 NOTE — Telephone Encounter (Signed)
Last filled 01/02/2012, LETVAK PATIENT

## 2012-02-17 ENCOUNTER — Ambulatory Visit: Payer: Medicare Other

## 2012-02-17 DIAGNOSIS — M533 Sacrococcygeal disorders, not elsewhere classified: Secondary | ICD-10-CM | POA: Diagnosis not present

## 2012-02-17 DIAGNOSIS — M545 Low back pain: Secondary | ICD-10-CM | POA: Diagnosis not present

## 2012-02-17 DIAGNOSIS — M999 Biomechanical lesion, unspecified: Secondary | ICD-10-CM | POA: Diagnosis not present

## 2012-02-18 ENCOUNTER — Ambulatory Visit (INDEPENDENT_AMBULATORY_CARE_PROVIDER_SITE_OTHER): Payer: Medicare Other | Admitting: Internal Medicine

## 2012-02-18 DIAGNOSIS — Z7901 Long term (current) use of anticoagulants: Secondary | ICD-10-CM

## 2012-02-18 DIAGNOSIS — Z5181 Encounter for therapeutic drug level monitoring: Secondary | ICD-10-CM | POA: Diagnosis not present

## 2012-02-18 DIAGNOSIS — I82409 Acute embolism and thrombosis of unspecified deep veins of unspecified lower extremity: Secondary | ICD-10-CM

## 2012-02-18 DIAGNOSIS — I82401 Acute embolism and thrombosis of unspecified deep veins of right lower extremity: Secondary | ICD-10-CM

## 2012-02-18 LAB — POCT INR: INR: 2.1

## 2012-02-18 NOTE — Patient Instructions (Signed)
Continue current dose, check in 4 weeks  

## 2012-02-24 ENCOUNTER — Ambulatory Visit (INDEPENDENT_AMBULATORY_CARE_PROVIDER_SITE_OTHER): Payer: Medicare Other | Admitting: Internal Medicine

## 2012-02-24 ENCOUNTER — Encounter: Payer: Self-pay | Admitting: Internal Medicine

## 2012-02-24 VITALS — BP 130/70 | HR 67 | Temp 98.4°F | Ht 67.0 in | Wt 212.0 lb

## 2012-02-24 DIAGNOSIS — R799 Abnormal finding of blood chemistry, unspecified: Secondary | ICD-10-CM

## 2012-02-24 DIAGNOSIS — E1129 Type 2 diabetes mellitus with other diabetic kidney complication: Secondary | ICD-10-CM | POA: Diagnosis not present

## 2012-02-24 DIAGNOSIS — I82409 Acute embolism and thrombosis of unspecified deep veins of unspecified lower extremity: Secondary | ICD-10-CM | POA: Diagnosis not present

## 2012-02-24 DIAGNOSIS — R7989 Other specified abnormal findings of blood chemistry: Secondary | ICD-10-CM

## 2012-02-24 DIAGNOSIS — K5909 Other constipation: Secondary | ICD-10-CM

## 2012-02-24 DIAGNOSIS — J449 Chronic obstructive pulmonary disease, unspecified: Secondary | ICD-10-CM

## 2012-02-24 DIAGNOSIS — N058 Unspecified nephritic syndrome with other morphologic changes: Secondary | ICD-10-CM | POA: Diagnosis not present

## 2012-02-24 DIAGNOSIS — K5900 Constipation, unspecified: Secondary | ICD-10-CM

## 2012-02-24 DIAGNOSIS — Z23 Encounter for immunization: Secondary | ICD-10-CM

## 2012-02-24 NOTE — Assessment & Plan Note (Signed)
urged him to use the miralax daily

## 2012-02-24 NOTE — Assessment & Plan Note (Signed)
In right calf He continues on the warfarin for now Uses support hose At next visit, will consider checking ultrasound and stopping coumadin if clot not at all present

## 2012-02-24 NOTE — Assessment & Plan Note (Signed)
Tends to run high when he is checked Lab Results  Component Value Date   HGBA1C 7.4* 11/24/2011   Will just recheck A1c

## 2012-02-24 NOTE — Patient Instructions (Signed)
Please try the miralax daily---it doesn't work well for constipation if you only take it "when you need it"

## 2012-02-24 NOTE — Progress Notes (Signed)
Subjective:    Patient ID: Miguel Huber, male    DOB: Jun 18, 1927, 78 y.o.   MRN: [id number]  HPI Here with friend Delos Haring his right leg is better now Using support hose---needs help getting them on though Did note some pain in ankles when the hose wasn't on Done with vascular surgeon  No chest pain Chronic DOE---doesn't take much but no sig change Gets around with rolling walker  Checks bid Morning sugars in high 100's usually  Evening very variable -- may be low or over 200 at times No recent hypoglycemic reactions  Has occ wheezing Only occ cough--some increase in past 2 weeks (pollen related?)  Current Outpatient Prescriptions on File Prior to Visit  Medication Sig Dispense Refill  . albuterol-ipratropium (COMBIVENT) 18-103 MCG/ACT inhaler Inhale 2 puffs into the lungs every 6 (six) hours as needed.        Marland Kitchen allopurinol (ZYLOPRIM) 100 MG tablet Take 100 mg by mouth daily.       . B-D INS SYRINGE 0.5CC/30GX1/2" 30G X 1/2" 0.5 ML MISC USE AS DIRECTED  100 each  11  . Fluticasone-Salmeterol (ADVAIR DISKUS) 250-50 MCG/DOSE AEPB Inhale 1 puff into the lungs 2 (two) times daily.        . furosemide (LASIX) 80 MG tablet Take 1 tablet (80 mg total) by mouth daily.  30 tablet  11  . ipratropium-albuterol (DUONEB) 0.5-2.5 (3) MG/3ML SOLN USE 1 VIAL IN NEBULIZER EVERY 6 HOURS WHILE AWAKE  270 mL  0  . ketoconazole (NIZORAL) 2 % cream APPLY TOPICALLY TO GROIN RASH TWICE A DAY UNTIL CLEAR  60 g  1  . lisinopril (PRINIVIL,ZESTRIL) 10 MG tablet Take 1 tablet (10 mg total) by mouth daily.  90 tablet  3  . NOVOLIN N 100 UNIT/ML injection INJECT 25 UNITS SUBQ TWO TIMES A DAY  10 mL  11  . omeprazole (PRILOSEC) 20 MG capsule TAKE ONE CAPSULE BY MOUTH EVERY DAY FOR ACID REFLUX  30 capsule  11  . polyethylene glycol (MIRALAX / GLYCOLAX) packet Take 17 g by mouth daily.      . simvastatin (ZOCOR) 20 MG tablet TAKE 1 TABLET BY MOUTH EVERY DAY  30 tablet  10  . temazepam (RESTORIL) 30 MG  capsule Take 1 capsule (30 mg total) by mouth at bedtime.  30 capsule  0  . traMADol (ULTRAM) 50 MG tablet Take 1-2 tablets (50-100 mg total) by mouth at bedtime as needed for pain.  60 tablet  1  . warfarin (COUMADIN) 5 MG tablet Take 1 tablet (5 mg total) by mouth daily.  30 tablet  6  . DISCONTD: colchicine (COLCRYS) 0.6 MG tablet Take 1 tablet (0.6 mg total) by mouth 2 (two) times daily.  60 tablet  1    Allergies  Allergen Reactions  . Theophyllines     Past Medical History  Diagnosis Date  . Allergic rhinitis   . Hx of adenomatous colonic polyps   . Pulmonary fibrosis   . COPD (chronic obstructive pulmonary disease)   . Diabetes mellitus type 2, insulin dependent   . GERD (gastroesophageal reflux disease)   . Erectile dysfunction   . Cardiomyopathy   . Hyperlipidemia   . Renal insufficiency   . Osteoarthritis   . Diabetes mellitus   . Hypertension   . DVT (deep venous thrombosis), right 5/13    Past Surgical History  Procedure Date  . Orif clavicle fracture     Left clavicle fracture- child  .  Orif ulnar fracture     Left readius/ ulna fracture child  . Lung biopsy     Right lung bx. negative 2001  . Cataract extraction     Cataracts/ IOL OU, [place] 2005, [place] ?  2000  . Cardiovascular stress test     Myoview stress negative EF 65-70% 08/04    Family History  Problem Relation Age of Onset  . Cancer Neg Hx     History   Social History  . Marital Status: Married    Spouse Name: N/A    Number of Children: 3  . Years of Education: N/A   Occupational History  . retired     Ambulance person   Social History Main Topics  . Smoking status: Former Games developer  . Smokeless tobacco: Never Used  . Alcohol Use: Not on file  . Drug Use: Not on file  . Sexually Active: Not on file   Other Topics Concern  . Not on file   Social History Narrative   Has platonic lady lfriend since ~2008  [facility]   Review of Systems Still constipated---  MOM does help but too much. Discussed using the miralax regularly Appetite is very good---weight up 4# Sleeps okay early in AM---not great at night. No sig daytime somnolence    Objective:   Physical Exam  Constitutional: He appears well-developed and well-nourished. No distress.  Neck: Normal range of motion. Neck supple.  Cardiovascular: Normal rate, regular rhythm and normal heart sounds.  Exam reveals no gallop.   No murmur heard. Pulmonary/Chest: Effort normal. No respiratory distress. He has wheezes. He has no rales.       Mild decreased breath sounds and scant exp wheezes Not really tight  Abdominal: Soft. There is no tenderness.  Musculoskeletal: He exhibits edema.       1+ non pitting edema in right calf Homan's negative  Lymphadenopathy:    He has no cervical adenopathy.  Psychiatric: He has a normal mood and affect. His behavior is normal.          Assessment & Plan:

## 2012-02-24 NOTE — Addendum Note (Signed)
Addended by: Sueanne Margarita on: 02/24/2012 11:33 AM   Modules accepted: Orders

## 2012-02-24 NOTE — Assessment & Plan Note (Signed)
Mild wheezing now Probably the main reason for his limitation with breathing No acute change though

## 2012-02-25 ENCOUNTER — Encounter: Payer: Self-pay | Admitting: *Deleted

## 2012-02-26 DIAGNOSIS — R05 Cough: Secondary | ICD-10-CM | POA: Diagnosis not present

## 2012-02-26 DIAGNOSIS — R0602 Shortness of breath: Secondary | ICD-10-CM | POA: Diagnosis not present

## 2012-02-26 DIAGNOSIS — J9610 Chronic respiratory failure, unspecified whether with hypoxia or hypercapnia: Secondary | ICD-10-CM | POA: Diagnosis not present

## 2012-02-26 DIAGNOSIS — J449 Chronic obstructive pulmonary disease, unspecified: Secondary | ICD-10-CM | POA: Diagnosis not present

## 2012-03-03 ENCOUNTER — Other Ambulatory Visit: Payer: Self-pay | Admitting: Family Medicine

## 2012-03-03 NOTE — Telephone Encounter (Signed)
Okay #30 x 0 

## 2012-03-03 NOTE — Telephone Encounter (Signed)
rx called into pharmacy

## 2012-03-03 NOTE — Telephone Encounter (Signed)
OK to refill? Last filled 01/30/12.

## 2012-03-17 ENCOUNTER — Ambulatory Visit (INDEPENDENT_AMBULATORY_CARE_PROVIDER_SITE_OTHER): Payer: Medicare Other | Admitting: Internal Medicine

## 2012-03-17 DIAGNOSIS — Z5181 Encounter for therapeutic drug level monitoring: Secondary | ICD-10-CM

## 2012-03-17 DIAGNOSIS — I82401 Acute embolism and thrombosis of unspecified deep veins of right lower extremity: Secondary | ICD-10-CM

## 2012-03-17 DIAGNOSIS — Z7901 Long term (current) use of anticoagulants: Secondary | ICD-10-CM

## 2012-03-17 DIAGNOSIS — I82409 Acute embolism and thrombosis of unspecified deep veins of unspecified lower extremity: Secondary | ICD-10-CM | POA: Diagnosis not present

## 2012-03-17 LAB — POCT INR: INR: 2.1

## 2012-03-17 NOTE — Patient Instructions (Signed)
Continue current dose, check in 4 weeks  

## 2012-03-19 ENCOUNTER — Other Ambulatory Visit: Payer: Self-pay | Admitting: Internal Medicine

## 2012-03-22 ENCOUNTER — Encounter: Payer: Self-pay | Admitting: Internal Medicine

## 2012-03-22 ENCOUNTER — Ambulatory Visit (INDEPENDENT_AMBULATORY_CARE_PROVIDER_SITE_OTHER): Payer: Medicare Other | Admitting: Internal Medicine

## 2012-03-22 ENCOUNTER — Ambulatory Visit (INDEPENDENT_AMBULATORY_CARE_PROVIDER_SITE_OTHER)
Admission: RE | Admit: 2012-03-22 | Discharge: 2012-03-22 | Disposition: A | Discharge status: Home / Self Care | Payer: Medicare Other | Source: Ambulatory Visit | Attending: Internal Medicine | Admitting: Internal Medicine

## 2012-03-22 VITALS — BP 114/68 | HR 87 | Temp 98.4°F | Wt 211.5 lb

## 2012-03-22 DIAGNOSIS — J441 Chronic obstructive pulmonary disease with (acute) exacerbation: Secondary | ICD-10-CM

## 2012-03-22 DIAGNOSIS — J449 Chronic obstructive pulmonary disease, unspecified: Secondary | ICD-10-CM | POA: Diagnosis not present

## 2012-03-22 DIAGNOSIS — J189 Pneumonia, unspecified organism: Secondary | ICD-10-CM

## 2012-03-22 MED ORDER — CEFTRIAXONE SODIUM 1 G IJ SOLR
1.0000 g | Freq: Once | INTRAMUSCULAR | Status: AC
  Administered 2012-03-22: 1 g via INTRAMUSCULAR

## 2012-03-22 MED ORDER — PREDNISONE 20 MG PO TABS: 40.0000 mg | ORAL_TABLET | Freq: Every day | ORAL | Status: DC

## 2012-03-22 MED ORDER — LEVOFLOXACIN 500 MG PO TABS: 500.0000 mg | ORAL_TABLET | Freq: Every day | ORAL | Status: DC

## 2012-03-22 MED ORDER — METHYLPREDNISOLONE ACETATE 40 MG/ML IJ SUSP
40.0000 mg | Freq: Once | INTRAMUSCULAR | Status: AC
  Administered 2012-03-22: 80 mg via INTRAMUSCULAR

## 2012-03-22 NOTE — Assessment & Plan Note (Signed)
I reviewed the CXR and am awaiting radiologist's report Looks like LLL pneumonia though may be technical issue Given his COPD exacerbation, he needs antibiotic anyway

## 2012-03-22 NOTE — Assessment & Plan Note (Signed)
Clearly has exacerbation of COPD Will give depomedrol shot and start prednisone Treat infection See tomorrow

## 2012-03-22 NOTE — Patient Instructions (Addendum)
Please decrease the warfarin to 2.5mg  alternating with 5mg  while taking the levofloxacin antibiotic

## 2012-03-22 NOTE — Progress Notes (Signed)
Subjective:    Patient ID: Miguel Huber, male    DOB: 1927-08-23, 78 y.o.   MRN: [id number]  HPI Has been coughing for the past 2 days Woke him at 3AM this AM---unable to stop Cough is dry Chronic SOB--this feels worse today No fever  Mild sore throat No ear pain  Current Outpatient Prescriptions on File Prior to Visit  Medication Sig Dispense Refill  . albuterol-ipratropium (COMBIVENT) 18-103 MCG/ACT inhaler Inhale 2 puffs into the lungs every 6 (six) hours as needed.        Marland Kitchen allopurinol (ZYLOPRIM) 100 MG tablet Take 100 mg by mouth daily.       . B-D INS SYRINGE 0.5CC/30GX1/2" 30G X 1/2" 0.5 ML MISC USE AS DIRECTED  100 each  11  . colchicine 0.6 MG tablet Take 0.6 mg by mouth 2 (two) times daily.      . Fluticasone-Salmeterol (ADVAIR DISKUS) 250-50 MCG/DOSE AEPB Inhale 1 puff into the lungs 2 (two) times daily.        . furosemide (LASIX) 80 MG tablet TAKE 1 TABLET BY MOUTH EVERY DAY  30 tablet  11  . ipratropium-albuterol (DUONEB) 0.5-2.5 (3) MG/3ML SOLN USE 1 VIAL IN NEBULIZER EVERY 6 HOURS WHILE AWAKE  270 mL  0  . ketoconazole (NIZORAL) 2 % cream APPLY TOPICALLY TO GROIN RASH TWICE A DAY UNTIL CLEAR  60 g  1  . lisinopril (PRINIVIL,ZESTRIL) 10 MG tablet Take 1 tablet (10 mg total) by mouth daily.  90 tablet  3  . NOVOLIN N 100 UNIT/ML injection INJECT 25 UNITS SUBQ TWO TIMES A DAY  10 mL  11  . omeprazole (PRILOSEC) 20 MG capsule TAKE ONE CAPSULE BY MOUTH EVERY DAY FOR ACID REFLUX  30 capsule  11  . polyethylene glycol (MIRALAX / GLYCOLAX) packet Take 17 g by mouth daily.      . simvastatin (ZOCOR) 20 MG tablet TAKE 1 TABLET BY MOUTH EVERY DAY  30 tablet  10  . temazepam (RESTORIL) 30 MG capsule TAKE ONE CAPSULE BY MOUTH AT BEDTIME  30 capsule  0  . traMADol (ULTRAM) 50 MG tablet Take 1-2 tablets (50-100 mg total) by mouth at bedtime as needed for pain.  60 tablet  1  . warfarin (COUMADIN) 5 MG tablet Take 1 tablet (5 mg total) by mouth daily.  30 tablet  6    Allergies   Allergen Reactions  . Theophyllines     Past Medical History  Diagnosis Date  . Allergic rhinitis   . Hx of adenomatous colonic polyps   . Pulmonary fibrosis   . COPD (chronic obstructive pulmonary disease)   . Diabetes mellitus type 2, insulin dependent   . GERD (gastroesophageal reflux disease)   . Erectile dysfunction   . Cardiomyopathy   . Hyperlipidemia   . Renal insufficiency   . Osteoarthritis   . Diabetes mellitus   . Hypertension   . DVT (deep venous thrombosis), right 5/13    Past Surgical History  Procedure Date  . Orif clavicle fracture     Left clavicle fracture- child  . Orif ulnar fracture     Left readius/ ulna fracture child  . Lung biopsy     Right lung bx. negative 2001  . Cataract extraction     Cataracts/ IOL OU, [place] 2005, [place] ?  2000  . Cardiovascular stress test     Myoview stress negative EF 65-70% 08/04    Family History  Problem Relation Age of Onset  .  Cancer Neg Hx     History   Social History  . Marital Status: Married    Spouse Name: N/A    Number of Children: 3  . Years of Education: N/A   Occupational History  . retired     Ambulance person   Social History Main Topics  . Smoking status: Former Games developer  . Smokeless tobacco: Never Used  . Alcohol Use: Not on file  . Drug Use: Not on file  . Sexually Active: Not on file   Other Topics Concern  . Not on file   Social History Narrative   Has platonic lady lfriend since ~2008  [facility] seeing [facility]   Review of Systems Some nosebleeding--relates to sinus trouble (has the oxygen cannula there) No rash No vomiting or diarrhea Appetite is okay    Objective:   Physical Exam  Constitutional: He appears well-developed and well-nourished.       tachypneic with audible wheezing Mild distress Frequent coarse cough  HENT:  Mouth/Throat: Oropharynx is clear and moist. No oropharyngeal exudate.  Neck: Normal range of motion. Neck supple.  Cardiovascular:  Normal rate and regular rhythm.  Exam reveals no gallop.   No murmur heard.      Distant heart sounds  Pulmonary/Chest: He is in respiratory distress. He has wheezes.       Decreased breath sounds Expiratory wheezes and tight  Musculoskeletal: He exhibits no edema.  Lymphadenopathy:    He has no cervical adenopathy.  Psychiatric:       Mildly anxious          Assessment & Plan:

## 2012-03-22 NOTE — Addendum Note (Signed)
Addended by: Sueanne Margarita on: 03/22/2012 03:13 PM   Modules accepted: Orders

## 2012-03-23 ENCOUNTER — Encounter: Payer: Self-pay | Admitting: Internal Medicine

## 2012-03-23 ENCOUNTER — Other Ambulatory Visit: Payer: Self-pay | Admitting: Internal Medicine

## 2012-03-23 ENCOUNTER — Ambulatory Visit (INDEPENDENT_AMBULATORY_CARE_PROVIDER_SITE_OTHER): Payer: Medicare Other | Admitting: Internal Medicine

## 2012-03-23 VITALS — BP 122/70 | HR 90 | Temp 97.8°F | Wt 210.0 lb

## 2012-03-23 DIAGNOSIS — E1129 Type 2 diabetes mellitus with other diabetic kidney complication: Secondary | ICD-10-CM

## 2012-03-23 DIAGNOSIS — J209 Acute bronchitis, unspecified: Secondary | ICD-10-CM

## 2012-03-23 DIAGNOSIS — J441 Chronic obstructive pulmonary disease with (acute) exacerbation: Secondary | ICD-10-CM | POA: Diagnosis not present

## 2012-03-23 MED ORDER — HYDROCODONE-HOMATROPINE 5-1.5 MG/5ML PO SYRP: 5.0000 mL | ORAL_SOLUTION | Freq: Every evening | ORAL | Status: DC | PRN

## 2012-03-23 NOTE — Progress Notes (Signed)
Subjective:    Patient ID: Miguel Huber, male    DOB: 1928/02/09, 78 y.o.   MRN: [id number]  HPI Here with lady friend  Feels some better Still on advair and combivent regularly Did start the prednisone and antibiotic  Sugar was up to 299 this AM after the steroids Takes 25N and 10 R bid   Cough is some better--dry only Less SOB Able to do personal care without dyspnea  Current Outpatient Prescriptions on File Prior to Visit  Medication Sig Dispense Refill  . albuterol-ipratropium (COMBIVENT) 18-103 MCG/ACT inhaler Inhale 2 puffs into the lungs every 6 (six) hours as needed.        Marland Kitchen allopurinol (ZYLOPRIM) 100 MG tablet Take 100 mg by mouth daily.       . colchicine 0.6 MG tablet Take 0.6 mg by mouth 2 (two) times daily.      . Fluticasone-Salmeterol (ADVAIR DISKUS) 250-50 MCG/DOSE AEPB Inhale 1 puff into the lungs 2 (two) times daily.        . furosemide (LASIX) 80 MG tablet TAKE 1 TABLET BY MOUTH EVERY DAY  30 tablet  11  . ipratropium-albuterol (DUONEB) 0.5-2.5 (3) MG/3ML SOLN USE 1 VIAL IN NEBULIZER EVERY 6 HOURS WHILE AWAKE  270 mL  0  . ketoconazole (NIZORAL) 2 % cream APPLY TOPICALLY TO GROIN RASH TWICE A DAY UNTIL CLEAR  60 g  1  . levofloxacin (LEVAQUIN) 500 MG tablet Take 1 tablet (500 mg total) by mouth daily.  10 tablet  0  . lisinopril (PRINIVIL,ZESTRIL) 10 MG tablet Take 1 tablet (10 mg total) by mouth daily.  90 tablet  3  . NOVOLIN N 100 UNIT/ML injection INJECT 25 UNITS SUBQ TWO TIMES A DAY  10 mL  11  . omeprazole (PRILOSEC) 20 MG capsule TAKE ONE CAPSULE BY MOUTH EVERY DAY FOR ACID REFLUX  30 capsule  11  . polyethylene glycol (MIRALAX / GLYCOLAX) packet Take 17 g by mouth daily.      . predniSONE (DELTASONE) 20 MG tablet Take 2 tablets (40 mg total) by mouth daily.  15 tablet  0  . simvastatin (ZOCOR) 20 MG tablet TAKE 1 TABLET BY MOUTH EVERY DAY  30 tablet  10  . temazepam (RESTORIL) 30 MG capsule TAKE ONE CAPSULE BY MOUTH AT BEDTIME  30 capsule  0  .  traMADol (ULTRAM) 50 MG tablet Take 1-2 tablets (50-100 mg total) by mouth at bedtime as needed for pain.  60 tablet  1  . warfarin (COUMADIN) 5 MG tablet Take 1 tablet (5 mg total) by mouth daily.  30 tablet  6   Current Facility-Administered Medications on File Prior to Visit  Medication Dose Route Frequency Provider Last Rate Last Dose  . cefTRIAXone (ROCEPHIN) injection 1 g  1 g Intramuscular Once Karie Schwalbe, MD   1 g at 03/22/12 1509  . methylPREDNISolone acetate (DEPO-MEDROL) injection 40 mg  40 mg Intramuscular Once Karie Schwalbe, MD   80 mg at 03/22/12 1510    Allergies  Allergen Reactions  . Theophyllines     Past Medical History  Diagnosis Date  . Allergic rhinitis   . Hx of adenomatous colonic polyps   . Pulmonary fibrosis   . COPD (chronic obstructive pulmonary disease)   . Diabetes mellitus type 2, insulin dependent   . GERD (gastroesophageal reflux disease)   . Erectile dysfunction   . Cardiomyopathy   . Hyperlipidemia   . Renal insufficiency   . Osteoarthritis   .  Diabetes mellitus   . Hypertension   . DVT (deep venous thrombosis), right 5/13    Past Surgical History  Procedure Date  . Orif clavicle fracture     Left clavicle fracture- child  . Orif ulnar fracture     Left readius/ ulna fracture child  . Lung biopsy     Right lung bx. negative 2001  . Cataract extraction     Cataracts/ IOL OU, [place] 2005, [place] ?  2000  . Cardiovascular stress test     Myoview stress negative EF 65-70% 08/04    Family History  Problem Relation Age of Onset  . Cancer Neg Hx     History   Social History  . Marital Status: Married    Spouse Name: N/A    Number of Children: 3  . Years of Education: N/A   Occupational History  . retired     Ambulance person   Social History Main Topics  . Smoking status: Former Games developer  . Smokeless tobacco: Never Used  . Alcohol Use: Not on file  . Drug Use: Not on file  . Sexually Active: Not on file   Other  Topics Concern  . Not on file   Social History Narrative   Has platonic lady lfriend since ~2008  [facility] seeing [facility]   Review of Systems No fever Appetite is fine    Objective:   Physical Exam  Constitutional: He appears well-developed and well-nourished. No distress.  Neck: Normal range of motion.  Cardiovascular: Normal rate, regular rhythm and normal heart sounds.  Exam reveals no gallop.   No murmur heard. Pulmonary/Chest: Effort normal. No respiratory distress. He has no wheezes. He has no rales.       Mild decreased breath sounds Occ tight cough but normal expiratory phase today without sig wheezing  Lymphadenopathy:    He has no cervical adenopathy.          Assessment & Plan:

## 2012-03-23 NOTE — Assessment & Plan Note (Signed)
CXR read as chronic fibrotic changes No acute infection Will continue the antibiotic though Hydrocodone for cough

## 2012-03-23 NOTE — Patient Instructions (Signed)
While on the prednisone, increase the R insulin to 15 units if your sugar is over 200

## 2012-03-23 NOTE — Assessment & Plan Note (Signed)
Sugars are worse on the steroids Discussed that it should be temporary Instructions given for increased regular insulin if over 200

## 2012-03-23 NOTE — Assessment & Plan Note (Signed)
Improved with steroid Will finish out prednisone course Continue his usual inhalers

## 2012-04-02 ENCOUNTER — Other Ambulatory Visit: Payer: Self-pay | Admitting: Internal Medicine

## 2012-04-02 NOTE — Telephone Encounter (Signed)
Okay #30 x 1 

## 2012-04-02 NOTE — Telephone Encounter (Signed)
rx called into pharmacy

## 2012-04-14 ENCOUNTER — Ambulatory Visit (INDEPENDENT_AMBULATORY_CARE_PROVIDER_SITE_OTHER): Payer: Medicare Other | Admitting: Internal Medicine

## 2012-04-14 DIAGNOSIS — I82409 Acute embolism and thrombosis of unspecified deep veins of unspecified lower extremity: Secondary | ICD-10-CM

## 2012-04-14 DIAGNOSIS — Z5181 Encounter for therapeutic drug level monitoring: Secondary | ICD-10-CM

## 2012-04-14 DIAGNOSIS — Z7901 Long term (current) use of anticoagulants: Secondary | ICD-10-CM

## 2012-04-14 DIAGNOSIS — I82401 Acute embolism and thrombosis of unspecified deep veins of right lower extremity: Secondary | ICD-10-CM

## 2012-04-14 LAB — POCT INR: INR: 1.4

## 2012-04-14 NOTE — Patient Instructions (Signed)
Take 10 mg today then take 5 mg daily, except 7.5 mg every Tues, check in 1 week

## 2012-04-21 ENCOUNTER — Ambulatory Visit (INDEPENDENT_AMBULATORY_CARE_PROVIDER_SITE_OTHER): Payer: Medicare Other | Admitting: Family Medicine

## 2012-04-21 DIAGNOSIS — I82409 Acute embolism and thrombosis of unspecified deep veins of unspecified lower extremity: Secondary | ICD-10-CM | POA: Diagnosis not present

## 2012-04-21 DIAGNOSIS — Z7901 Long term (current) use of anticoagulants: Secondary | ICD-10-CM | POA: Diagnosis not present

## 2012-04-21 DIAGNOSIS — I82401 Acute embolism and thrombosis of unspecified deep veins of right lower extremity: Secondary | ICD-10-CM

## 2012-04-21 DIAGNOSIS — Z5181 Encounter for therapeutic drug level monitoring: Secondary | ICD-10-CM | POA: Diagnosis not present

## 2012-04-21 LAB — POCT INR: INR: 2.8

## 2012-04-21 NOTE — Patient Instructions (Addendum)
5 mg daily, except 7.5 mg ever Tues, check in 2 weeks

## 2012-05-06 ENCOUNTER — Ambulatory Visit (INDEPENDENT_AMBULATORY_CARE_PROVIDER_SITE_OTHER): Payer: Medicare Other | Admitting: General Practice

## 2012-05-06 DIAGNOSIS — Z7901 Long term (current) use of anticoagulants: Secondary | ICD-10-CM

## 2012-05-06 DIAGNOSIS — I82409 Acute embolism and thrombosis of unspecified deep veins of unspecified lower extremity: Secondary | ICD-10-CM | POA: Diagnosis not present

## 2012-05-06 NOTE — Patient Instructions (Addendum)
Skip coumadin dosage today and then resume 1 tablet all days except 1 1/2 tablets on Tuesday.  Re-check in 3 weeks.

## 2012-05-07 DIAGNOSIS — R609 Edema, unspecified: Secondary | ICD-10-CM | POA: Diagnosis not present

## 2012-05-07 DIAGNOSIS — R809 Proteinuria, unspecified: Secondary | ICD-10-CM | POA: Diagnosis not present

## 2012-05-07 DIAGNOSIS — N2581 Secondary hyperparathyroidism of renal origin: Secondary | ICD-10-CM | POA: Diagnosis not present

## 2012-05-07 DIAGNOSIS — M109 Gout, unspecified: Secondary | ICD-10-CM | POA: Diagnosis not present

## 2012-05-07 DIAGNOSIS — I10 Essential (primary) hypertension: Secondary | ICD-10-CM | POA: Diagnosis not present

## 2012-05-13 DIAGNOSIS — M533 Sacrococcygeal disorders, not elsewhere classified: Secondary | ICD-10-CM | POA: Diagnosis not present

## 2012-05-13 DIAGNOSIS — M545 Low back pain: Secondary | ICD-10-CM | POA: Diagnosis not present

## 2012-05-13 DIAGNOSIS — M999 Biomechanical lesion, unspecified: Secondary | ICD-10-CM | POA: Diagnosis not present

## 2012-05-14 ENCOUNTER — Other Ambulatory Visit: Payer: Self-pay | Admitting: Internal Medicine

## 2012-05-14 DIAGNOSIS — Z85828 Personal history of other malignant neoplasm of skin: Secondary | ICD-10-CM | POA: Diagnosis not present

## 2012-05-14 DIAGNOSIS — L570 Actinic keratosis: Secondary | ICD-10-CM | POA: Diagnosis not present

## 2012-05-15 ENCOUNTER — Other Ambulatory Visit: Payer: Self-pay | Admitting: Internal Medicine

## 2012-05-17 NOTE — Telephone Encounter (Signed)
rx called into pharmacy

## 2012-05-17 NOTE — Telephone Encounter (Signed)
Okay #30 x 0  He should have had enough to last till early January Let him know I will not fill this early again

## 2012-05-27 ENCOUNTER — Ambulatory Visit: Payer: Medicare Other

## 2012-05-29 ENCOUNTER — Other Ambulatory Visit: Payer: Self-pay | Admitting: Internal Medicine

## 2012-05-31 ENCOUNTER — Ambulatory Visit (INDEPENDENT_AMBULATORY_CARE_PROVIDER_SITE_OTHER): Payer: Medicare Other | Admitting: General Practice

## 2012-05-31 ENCOUNTER — Ambulatory Visit (INDEPENDENT_AMBULATORY_CARE_PROVIDER_SITE_OTHER): Payer: Medicare Other | Admitting: Internal Medicine

## 2012-05-31 ENCOUNTER — Encounter: Payer: Self-pay | Admitting: Internal Medicine

## 2012-05-31 VITALS — BP 120/70 | HR 87 | Temp 98.6°F

## 2012-05-31 DIAGNOSIS — I82409 Acute embolism and thrombosis of unspecified deep veins of unspecified lower extremity: Secondary | ICD-10-CM

## 2012-05-31 DIAGNOSIS — J449 Chronic obstructive pulmonary disease, unspecified: Secondary | ICD-10-CM

## 2012-05-31 DIAGNOSIS — I10 Essential (primary) hypertension: Secondary | ICD-10-CM

## 2012-05-31 DIAGNOSIS — E1129 Type 2 diabetes mellitus with other diabetic kidney complication: Secondary | ICD-10-CM | POA: Diagnosis not present

## 2012-05-31 DIAGNOSIS — Z7901 Long term (current) use of anticoagulants: Secondary | ICD-10-CM | POA: Diagnosis not present

## 2012-05-31 DIAGNOSIS — J4489 Other specified chronic obstructive pulmonary disease: Secondary | ICD-10-CM

## 2012-05-31 NOTE — Assessment & Plan Note (Signed)
Has now been 7 months Right calf is still larger than left---but no signs of residual clot Will stop the coumadin Needs reeval if any recurrent swelling

## 2012-05-31 NOTE — Progress Notes (Signed)
Subjective:    Patient ID: Miguel Huber, male    DOB: [birth year], 79 y.o.   MRN: [id number]  HPI Here with girlfriend  Has an itchy area on outside of right ankle No leg pain Still notes some asymmetry in legs---right is bigger than left  Walks with rolling walker Goes to town or [place] on most days  Checking sugars bid--fasting and before supper Average is about 150  No chest pain Chronic SOB--- coughs regularly still Hasn't been sick Slight wheezing at times  Current Outpatient Prescriptions on File Prior to Visit  Medication Sig Dispense Refill  . albuterol-ipratropium (COMBIVENT) 18-103 MCG/ACT inhaler Inhale 2 puffs into the lungs every 6 (six) hours as needed.        Marland Kitchen allopurinol (ZYLOPRIM) 100 MG tablet Take 100 mg by mouth daily.       . Fluticasone-Salmeterol (ADVAIR DISKUS) 250-50 MCG/DOSE AEPB Inhale 1 puff into the lungs 2 (two) times daily.        . furosemide (LASIX) 80 MG tablet TAKE 1 TABLET BY MOUTH EVERY DAY  30 tablet  11  . HYDROcodone-homatropine (HYCODAN) 5-1.5 MG/5ML syrup Take 5 mLs by mouth at bedtime as needed for cough.  120 mL  0  . Insulin Syringe-Needle U-100 (B-D INS SYRINGE 0.5CC/30GX1/2") 30G X 1/2" 0.5 ML MISC Use as directed to inject insulin dx: 250.00  100 each  3  . ipratropium-albuterol (DUONEB) 0.5-2.5 (3) MG/3ML SOLN USE 1 VIAL IN NEBULIZER EVERY 6 HOURS WHILE AWAKE  270 mL  0  . ketoconazole (NIZORAL) 2 % cream APPLY TOPICALLY TO GROIN RASH TWICE A DAY UNTIL CLEAR  60 g  1  . levofloxacin (LEVAQUIN) 500 MG tablet Take 1 tablet (500 mg total) by mouth daily.  10 tablet  0  . lisinopril (PRINIVIL,ZESTRIL) 10 MG tablet TAKE 1 TABLET BY MOUTH EVERY DAY  90 tablet  3  . NOVOLIN N 100 UNIT/ML injection INJECT 25 UNITS SUBQ TWO TIMES A DAY  10 mL  11  . NOVOLIN R 100 UNIT/ML injection INJECT 10 UNITS TWO TIMES A DAY  10 mL  6  . omeprazole (PRILOSEC) 20 MG capsule TAKE ONE CAPSULE BY MOUTH EVERY DAY FOR ACID REFLUX  30 capsule  11  .  polyethylene glycol (MIRALAX / GLYCOLAX) packet Take 17 g by mouth daily.      . predniSONE (DELTASONE) 20 MG tablet Take 2 tablets (40 mg total) by mouth daily.  15 tablet  0  . simvastatin (ZOCOR) 20 MG tablet TAKE 1 TABLET BY MOUTH EVERY DAY  30 tablet  10  . temazepam (RESTORIL) 30 MG capsule TAKE ONE CAPSULE BY MOUTH EVERY EVENING AT BEDTIME  30 capsule  0  . traMADol (ULTRAM) 50 MG tablet Take 1-2 tablets (50-100 mg total) by mouth at bedtime as needed for pain.  60 tablet  1  . warfarin (COUMADIN) 5 MG tablet Take 1 tablet (5 mg total) by mouth daily.  30 tablet  6    Allergies  Allergen Reactions  . Theophyllines     Past Medical History  Diagnosis Date  . Allergic rhinitis   . Hx of adenomatous colonic polyps   . Pulmonary fibrosis   . COPD (chronic obstructive pulmonary disease)   . GERD (gastroesophageal reflux disease)   . Erectile dysfunction   . Cardiomyopathy   . Hyperlipidemia   . Renal insufficiency   . Osteoarthritis   . Diabetes mellitus   . Hypertension   . DVT (  deep venous thrombosis), right 5/13    Past Surgical History  Procedure Date  . Orif clavicle fracture     Left clavicle fracture- child  . Orif ulnar fracture     Left readius/ ulna fracture child  . Lung biopsy     Right lung bx. negative 2001  . Cataract extraction     Cataracts/ IOL OU, [place] 2005, [place] ?  2000  . Cardiovascular stress test     Myoview stress negative EF 65-70% 08/04    Family History  Problem Relation Age of Onset  . Cancer Neg Hx     History   Social History  . Marital Status: Married    Spouse Name: N/A    Number of Children: 3  . Years of Education: N/A   Occupational History  . retired     Ambulance person   Social History Main Topics  . Smoking status: Former Games developer  . Smokeless tobacco: Never Used  . Alcohol Use: Not on file  . Drug Use: Not on file  . Sexually Active: Not on file   Other Topics Concern  . Not on file   Social History  Narrative   Has platonic lady lfriend since ~2008  [facility]   Review of Systems Does note some right knee pain after he keeps it very elevated    Objective:   Physical Exam  Constitutional: He appears well-developed and well-nourished. No distress.  Neck: Normal range of motion. Neck supple.  Cardiovascular: Normal rate, regular rhythm, normal heart sounds and intact distal pulses.  Exam reveals no gallop.   No murmur heard. Pulmonary/Chest: Effort normal. No respiratory distress. He has no wheezes. He has no rales.       Decreased breath sounds but clear  Musculoskeletal: He exhibits no tenderness.       Asymmetry with right calf bigger than left but no edema or pitting Homan's negative  Lymphadenopathy:    He has no cervical adenopathy.  Skin:       No foot lesions  Psychiatric: He has a normal mood and affect. His behavior is normal.          Assessment & Plan:

## 2012-05-31 NOTE — Assessment & Plan Note (Signed)
Lab Results  Component Value Date   HGBA1C 7.8* 02/24/2012   Still seems to have reasonable control Will recheck

## 2012-05-31 NOTE — Patient Instructions (Signed)
Coumadin dc'd per Dr. Alphonsus Sias.

## 2012-05-31 NOTE — Assessment & Plan Note (Signed)
Chronic dyspnea but stable No changes needed

## 2012-05-31 NOTE — Patient Instructions (Addendum)
Continue taking 1 tablet all days except 1 1/2 tablets on Tuesday.  Re-check in 4 weeks.

## 2012-05-31 NOTE — Assessment & Plan Note (Signed)
BP Readings from Last 3 Encounters:  05/31/12 120/70  03/23/12 122/70  03/22/12 114/68   No changes needed

## 2012-06-01 ENCOUNTER — Encounter: Payer: Self-pay | Admitting: *Deleted

## 2012-06-01 ENCOUNTER — Other Ambulatory Visit: Payer: Self-pay | Admitting: Internal Medicine

## 2012-06-10 DIAGNOSIS — J449 Chronic obstructive pulmonary disease, unspecified: Secondary | ICD-10-CM | POA: Diagnosis not present

## 2012-06-10 DIAGNOSIS — R05 Cough: Secondary | ICD-10-CM | POA: Diagnosis not present

## 2012-06-10 DIAGNOSIS — R0602 Shortness of breath: Secondary | ICD-10-CM | POA: Diagnosis not present

## 2012-06-10 DIAGNOSIS — J9610 Chronic respiratory failure, unspecified whether with hypoxia or hypercapnia: Secondary | ICD-10-CM | POA: Diagnosis not present

## 2012-06-26 ENCOUNTER — Other Ambulatory Visit: Payer: Self-pay | Admitting: Internal Medicine

## 2012-06-28 ENCOUNTER — Ambulatory Visit: Payer: Medicare Other

## 2012-06-28 NOTE — Telephone Encounter (Signed)
rx called into pharmacy

## 2012-06-28 NOTE — Telephone Encounter (Signed)
Okay #30 x 0 

## 2012-07-26 ENCOUNTER — Other Ambulatory Visit: Payer: Self-pay | Admitting: Internal Medicine

## 2012-07-26 NOTE — Telephone Encounter (Signed)
rx called into pharmacy

## 2012-07-26 NOTE — Telephone Encounter (Signed)
Okay #30 x 0 

## 2012-08-07 ENCOUNTER — Emergency Department: Payer: Self-pay | Admitting: Emergency Medicine

## 2012-08-07 LAB — COMPREHENSIVE METABOLIC PANEL
Alkaline Phosphatase: 115 U/L (ref 50–136)
Anion Gap: 4 — ABNORMAL LOW (ref 7–16)
BUN: 23 mg/dL — ABNORMAL HIGH (ref 7–18)
Bilirubin,Total: 0.6 mg/dL (ref 0.2–1.0)
Chloride: 103 mmol/L (ref 98–107)
Creatinine: 1.49 mg/dL — ABNORMAL HIGH (ref 0.60–1.30)
EGFR (Non-African Amer.): 43 — ABNORMAL LOW
Glucose: 249 mg/dL — ABNORMAL HIGH (ref 65–99)
Osmolality: 290 (ref 275–301)
Potassium: 3.3 mmol/L — ABNORMAL LOW (ref 3.5–5.1)
SGOT(AST): 22 U/L (ref 15–37)
SGPT (ALT): 26 U/L (ref 12–78)
Total Protein: 7.8 g/dL (ref 6.4–8.2)

## 2012-08-07 LAB — CBC WITH DIFFERENTIAL/PLATELET
Basophil #: 0 10*3/uL (ref 0.0–0.1)
Basophil %: 0.5 %
Eosinophil %: 1 %
HCT: 46.2 % (ref 40.0–52.0)
Lymphocyte #: 1.1 10*3/uL (ref 1.0–3.6)
MCH: 32.8 pg (ref 26.0–34.0)
Neutrophil #: 6.3 10*3/uL (ref 1.4–6.5)
Neutrophil %: 74.4 %
RDW: 13.3 % (ref 11.5–14.5)
WBC: 8.4 10*3/uL (ref 3.8–10.6)

## 2012-08-07 LAB — CK TOTAL AND CKMB (NOT AT ARMC): CK, Total: 225 U/L (ref 35–232)

## 2012-08-07 LAB — PRO B NATRIURETIC PEPTIDE: B-Type Natriuretic Peptide: 46 pg/mL (ref 0–450)

## 2012-08-12 ENCOUNTER — Telehealth: Payer: Self-pay | Admitting: Internal Medicine

## 2012-08-12 NOTE — Telephone Encounter (Signed)
Patient Information:  Caller Name: Rahshawn  Phone: [phone]  Patient: Dovid, Bartko  Gender: Male  DOB: Aug 15, 1927  Age: 79 Years  PCP: [facility])  Office Follow Up:  Does the office need to follow up with this patient?: No  Instructions For The Office: N/A   Symptoms  Reason For Call & Symptoms: Patient calling, has had a cold and cough since 3/15.  No fever.  Not a productive cough.  Went to the UC on Saturday and was sent to the ED.  After 9 hours he signed out AMA but was given a script for Prednisone and Benzonatate.  His sx are worse then they were.  The cough is congested sounding.  States that he is taking his other medications as ordered.  Reviewed Health History In EMR: Yes  Reviewed Medications In EMR: Yes  Reviewed Allergies In EMR: Yes  Reviewed Surgeries / Procedures: Yes  Date of Onset of Symptoms: 08/07/2012  Treatments Tried: Prednisone  Treatments Tried Worked: No  Guideline(s) Used:  Cough  Disposition Per Guideline:   Go to ED Now (or to Office with PCP Approval)  Reason For Disposition Reached:   Patient sounds very sick or weak to the triager  Advice Given:  N/A  Patient Refused Recommendation:  Patient Will Make Own Appointment  Has appt. scheduled for Friday at 345p.  Tried to schedule with a provider or mid level at other locations and  none are available.

## 2012-08-12 NOTE — Telephone Encounter (Signed)
Will evaluate at his appt tomorrow

## 2012-08-13 ENCOUNTER — Ambulatory Visit (INDEPENDENT_AMBULATORY_CARE_PROVIDER_SITE_OTHER): Payer: Medicare Other | Admitting: Internal Medicine

## 2012-08-13 ENCOUNTER — Encounter: Payer: Self-pay | Admitting: Internal Medicine

## 2012-08-13 VITALS — BP 130/70 | HR 80 | Temp 98.8°F | Wt 211.0 lb

## 2012-08-13 DIAGNOSIS — J441 Chronic obstructive pulmonary disease with (acute) exacerbation: Secondary | ICD-10-CM

## 2012-08-13 DIAGNOSIS — J209 Acute bronchitis, unspecified: Secondary | ICD-10-CM

## 2012-08-13 MED ORDER — AMOXICILLIN-POT CLAVULANATE 875-125 MG PO TABS: 1.0000 | ORAL_TABLET | Freq: Two times a day (BID) | ORAL | Status: DC

## 2012-08-13 MED ORDER — HYDROCODONE-HOMATROPINE 5-1.5 MG/5ML PO SYRP: 5.0000 mL | ORAL_SOLUTION | Freq: Every evening | ORAL | Status: DC | PRN

## 2012-08-13 MED ORDER — IPRATROPIUM-ALBUTEROL 0.5-2.5 (3) MG/3ML IN SOLN: 3.0000 mL | Freq: Four times a day (QID) | RESPIRATORY_TRACT | Status: DC

## 2012-08-13 MED ORDER — PREDNISONE 20 MG PO TABS: 40.0000 mg | ORAL_TABLET | Freq: Every day | ORAL | Status: DC

## 2012-08-13 NOTE — Assessment & Plan Note (Signed)
Not productive but has been sick for a week and has respiratory compromise Will treat with levaquin

## 2012-08-13 NOTE — Assessment & Plan Note (Signed)
Has not cleared his exacerbation Only got 4 days of prednisone and hasn't been using his nebulizer  Will give longer prednisone course Antibiotic Use the nebulizer

## 2012-08-13 NOTE — Progress Notes (Signed)
Subjective:    Patient ID: Miguel Huber, male    DOB: 10-11-1927, 79 y.o.   MRN: [id number]  HPI Here with girlfriend  Micah Flesher to urgent care due to bad cough and SOB-- 7 days ago Was sent to ER Spent 9 hours in the ER--got IV but never got any meds CXR done but they didn't tell him what it showed Given prednisone, hydrocodone cough syrup and benzonatate   Still coughing Kept him up last night Non productive Breathing is not back to normal  No fever No sweats at night  Has nebulizer but hasn't been using it  Current Outpatient Prescriptions on File Prior to Visit  Medication Sig Dispense Refill  . albuterol-ipratropium (COMBIVENT) 18-103 MCG/ACT inhaler Inhale 2 puffs into the lungs every 6 (six) hours as needed.        Marland Kitchen allopurinol (ZYLOPRIM) 100 MG tablet Take 100 mg by mouth daily.       . Fluticasone-Salmeterol (ADVAIR DISKUS) 250-50 MCG/DOSE AEPB Inhale 1 puff into the lungs 2 (two) times daily.        . furosemide (LASIX) 80 MG tablet TAKE 1 TABLET BY MOUTH EVERY DAY  30 tablet  11  . HYDROcodone-homatropine (HYCODAN) 5-1.5 MG/5ML syrup Take 5 mLs by mouth at bedtime as needed for cough.  120 mL  0  . Insulin Syringe-Needle U-100 (B-D INS SYRINGE 0.5CC/30GX1/2") 30G X 1/2" 0.5 ML MISC Use as directed to inject insulin dx: 250.00  100 each  3  . ipratropium-albuterol (DUONEB) 0.5-2.5 (3) MG/3ML SOLN USE 1 VIAL IN NEBULIZER EVERY 6 HOURS WHILE AWAKE  270 mL  0  . ketoconazole (NIZORAL) 2 % cream APPLY TOPICALLY TO GROIN RASH TWICE A DAY UNTIL CLEAR  60 g  1  . levofloxacin (LEVAQUIN) 500 MG tablet Take 1 tablet (500 mg total) by mouth daily.  10 tablet  0  . lisinopril (PRINIVIL,ZESTRIL) 10 MG tablet TAKE 1 TABLET BY MOUTH EVERY DAY  90 tablet  3  . NOVOLIN N 100 UNIT/ML injection INJECT 25 UNITS SUBQ TWO TIMES A DAY  10 mL  11  . NOVOLIN R 100 UNIT/ML injection INJECT 10 UNITS TWO TIMES A DAY  10 mL  6  . omeprazole (PRILOSEC) 20 MG capsule TAKE ONE CAPSULE BY MOUTH EVERY  DAY FOR ACID REFLUX  30 capsule  11  . polyethylene glycol (MIRALAX / GLYCOLAX) packet Take 17 g by mouth daily.      . predniSONE (DELTASONE) 20 MG tablet Take 2 tablets (40 mg total) by mouth daily.  15 tablet  0  . simvastatin (ZOCOR) 20 MG tablet TAKE 1 TABLET BY MOUTH EVERY DAY  30 tablet  11  . temazepam (RESTORIL) 30 MG capsule TAKE ONE CAPSULE BY MOUTH AT BEDTIME  30 capsule  0  . traMADol (ULTRAM) 50 MG tablet Take 1-2 tablets (50-100 mg total) by mouth at bedtime as needed for pain.  60 tablet  1  . warfarin (COUMADIN) 5 MG tablet        No current facility-administered medications on file prior to visit.    Allergies  Allergen Reactions  . Theophyllines     Past Medical History  Diagnosis Date  . Allergic rhinitis   . Hx of adenomatous colonic polyps   . Pulmonary fibrosis   . COPD (chronic obstructive pulmonary disease)   . GERD (gastroesophageal reflux disease)   . Erectile dysfunction   . Cardiomyopathy   . Hyperlipidemia   . Renal insufficiency   .  Osteoarthritis   . Diabetes mellitus   . Hypertension   . DVT (deep venous thrombosis), right 5/13    Past Surgical History  Procedure Laterality Date  . Orif clavicle fracture      Left clavicle fracture- child  . Orif ulnar fracture      Left readius/ ulna fracture child  . Lung biopsy      Right lung bx. negative 2001  . Cataract extraction      Cataracts/ IOL OU, [place] 2005, [place] ?  2000  . Cardiovascular stress test      Myoview stress negative EF 65-70% 08/04    Family History  Problem Relation Age of Onset  . Cancer Neg Hx     History   Social History  . Marital Status: Married    Spouse Name: N/A    Number of Children: 3  . Years of Education: N/A   Occupational History  . retired     Ambulance person   Social History Main Topics  . Smoking status: Former Games developer  . Smokeless tobacco: Never Used  . Alcohol Use: Not on file  . Drug Use: Not on file  . Sexually Active: Not on file    Other Topics Concern  . Not on file   Social History Narrative   Has platonic lady lfriend since ~2008  Cleda Daub   Currently seeing Corrie Dandy   Review of Systems Appetite okay No nausea or vomiting Stools more frequent with this illness    Objective:   Physical Exam  Constitutional: He appears well-developed and well-nourished. No distress.  HENT:  Mouth/Throat: Oropharynx is clear and moist. No oropharyngeal exudate.  Neck: Normal range of motion.  Pulmonary/Chest: He is in respiratory distress. He has wheezes. He has no rales.  Decreased air flow, tight cough and expiratory wheezes  Musculoskeletal: He exhibits no edema.  Lymphadenopathy:    He has no cervical adenopathy.          Assessment & Plan:

## 2012-08-19 ENCOUNTER — Other Ambulatory Visit: Payer: Self-pay | Admitting: *Deleted

## 2012-08-19 MED ORDER — IPRATROPIUM-ALBUTEROL 0.5-2.5 (3) MG/3ML IN SOLN: 3.0000 mL | Freq: Four times a day (QID) | RESPIRATORY_TRACT | Status: DC

## 2012-08-19 NOTE — Telephone Encounter (Signed)
rx sent to pharmacy by e-script  

## 2012-08-24 ENCOUNTER — Other Ambulatory Visit: Payer: Self-pay | Admitting: Internal Medicine

## 2012-08-25 NOTE — Telephone Encounter (Signed)
Okay #30 x 1 

## 2012-08-25 NOTE — Telephone Encounter (Signed)
Rx phoned into pharmacy.

## 2012-08-27 ENCOUNTER — Ambulatory Visit (INDEPENDENT_AMBULATORY_CARE_PROVIDER_SITE_OTHER): Payer: Medicare Other | Admitting: Internal Medicine

## 2012-08-27 ENCOUNTER — Encounter: Payer: Self-pay | Admitting: Internal Medicine

## 2012-08-27 VITALS — BP 116/70 | HR 65 | Temp 97.9°F | Wt 199.0 lb

## 2012-08-27 DIAGNOSIS — I428 Other cardiomyopathies: Secondary | ICD-10-CM | POA: Diagnosis not present

## 2012-08-27 DIAGNOSIS — J441 Chronic obstructive pulmonary disease with (acute) exacerbation: Secondary | ICD-10-CM

## 2012-08-27 NOTE — Assessment & Plan Note (Signed)
Diuresed as part of his recovery Not sure if some of the dyspnea could be cardiac Will have him monitor weight

## 2012-08-27 NOTE — Assessment & Plan Note (Signed)
Better Back on regular regimen

## 2012-08-27 NOTE — Progress Notes (Signed)
Subjective:    Patient ID: Miguel Huber, male    DOB: 05-07-1928, 79 y.o.   MRN: [id number]  HPI Here with lady friend Feels much better Weight is down 12#----but he has taken the furosemide every day  Breathing is better Very little cough No fever  Has had a couple of chest pains---sharp and sudden. Resolves quickly DOE has improved  Current Outpatient Prescriptions on File Prior to Visit  Medication Sig Dispense Refill  . allopurinol (ZYLOPRIM) 100 MG tablet Take 100 mg by mouth daily.       . Fluticasone-Salmeterol (ADVAIR DISKUS) 250-50 MCG/DOSE AEPB Inhale 1 puff into the lungs 2 (two) times daily.        . furosemide (LASIX) 80 MG tablet TAKE 1 TABLET BY MOUTH EVERY DAY  30 tablet  11  . HYDROcodone-homatropine (HYCODAN) 5-1.5 MG/5ML syrup Take 5 mLs by mouth at bedtime as needed for cough.  120 mL  0  . Insulin Syringe-Needle U-100 (B-D INS SYRINGE 0.5CC/30GX1/2") 30G X 1/2" 0.5 ML MISC Use as directed to inject insulin dx: 250.00  100 each  3  . ipratropium-albuterol (DUONEB) 0.5-2.5 (3) MG/3ML SOLN Take 3 mLs by nebulization 4 (four) times daily. Dx: 491.21  360 mL  11  . ketoconazole (NIZORAL) 2 % cream APPLY TOPICALLY TO GROIN RASH TWICE A DAY UNTIL CLEAR  60 g  1  . lisinopril (PRINIVIL,ZESTRIL) 10 MG tablet TAKE 1 TABLET BY MOUTH EVERY DAY  90 tablet  3  . NOVOLIN N 100 UNIT/ML injection INJECT 25 UNITS SUBQ TWO TIMES A DAY  10 mL  11  . NOVOLIN R 100 UNIT/ML injection INJECT 10 UNITS TWO TIMES A DAY  10 mL  6  . omeprazole (PRILOSEC) 20 MG capsule TAKE ONE CAPSULE BY MOUTH EVERY DAY FOR ACID REFLUX  30 capsule  11  . polyethylene glycol (MIRALAX / GLYCOLAX) packet Take 17 g by mouth daily.      . simvastatin (ZOCOR) 20 MG tablet TAKE 1 TABLET BY MOUTH EVERY DAY  30 tablet  11  . temazepam (RESTORIL) 30 MG capsule TAKE ONE CAPSULE BY MOUTH AT BEDTIME  30 capsule  1  . traMADol (ULTRAM) 50 MG tablet Take 1-2 tablets (50-100 mg total) by mouth at bedtime as needed for  pain.  60 tablet  1   No current facility-administered medications on file prior to visit.    Allergies  Allergen Reactions  . Theophyllines     Past Medical History  Diagnosis Date  . Allergic rhinitis   . Hx of adenomatous colonic polyps   . Pulmonary fibrosis   . COPD (chronic obstructive pulmonary disease)   . GERD (gastroesophageal reflux disease)   . Erectile dysfunction   . Cardiomyopathy   . Hyperlipidemia   . Renal insufficiency   . Osteoarthritis   . Diabetes mellitus   . Hypertension   . DVT (deep venous thrombosis), right 5/13    Past Surgical History  Procedure Laterality Date  . Orif clavicle fracture      Left clavicle fracture- child  . Orif ulnar fracture      Left readius/ ulna fracture child  . Lung biopsy      Right lung bx. negative 2001  . Cataract extraction      Cataracts/ IOL OU, [place] 2005, [place] ?  2000  . Cardiovascular stress test      Myoview stress negative EF 65-70% 08/04    Family History  Problem Relation Age of  Onset  . Cancer Neg Hx     History   Social History  . Marital Status: Married    Spouse Name: N/A    Number of Children: 3  . Years of Education: N/A   Occupational History  . retired     Ambulance person   Social History Main Topics  . Smoking status: Former Games developer  . Smokeless tobacco: Never Used  . Alcohol Use: Not on file  . Drug Use: Not on file  . Sexually Active: Not on file   Other Topics Concern  . Not on file   Social History Narrative   Has platonic lady lfriend since ~2008  Cleda Daub   Currently seeing Corrie Dandy   Review of Systems Appetite is good Sleeps fairly well---gets up to void once usually. Occasional problems getting back to sleep. Some daytime somnolence---occ after lunch nap Sugars ran up a bit on the prednisone Fell 4 days ago--using walker at [employer] using walker. Needed help from rescue to get up. Checked out and no worrisome findings.    Objective:   Physical Exam   Constitutional: He appears well-developed and well-nourished. No distress.  Neck: Normal range of motion.  Cardiovascular: Normal rate, regular rhythm and normal heart sounds.  Exam reveals no gallop.   No murmur heard. Pulmonary/Chest: Effort normal. No respiratory distress. He has no wheezes. He has no rales.  Decreased breath sounds but clear Normal expiratory phase  Lymphadenopathy:    He has no cervical adenopathy.          Assessment & Plan:

## 2012-08-27 NOTE — Patient Instructions (Signed)
Please weigh yourself daily. If your weight stays up 3-5# over your baseline you may need extra furosemide. Contact me for instructions.

## 2012-09-11 ENCOUNTER — Other Ambulatory Visit: Payer: Self-pay | Admitting: Internal Medicine

## 2012-09-16 ENCOUNTER — Other Ambulatory Visit: Payer: Self-pay | Admitting: Internal Medicine

## 2012-09-28 ENCOUNTER — Encounter: Payer: Self-pay | Admitting: Internal Medicine

## 2012-09-28 ENCOUNTER — Ambulatory Visit (INDEPENDENT_AMBULATORY_CARE_PROVIDER_SITE_OTHER): Payer: Medicare Other | Admitting: Internal Medicine

## 2012-09-28 VITALS — BP 106/60 | HR 89 | Temp 99.0°F | Wt 198.0 lb

## 2012-09-28 DIAGNOSIS — E1129 Type 2 diabetes mellitus with other diabetic kidney complication: Secondary | ICD-10-CM

## 2012-09-28 DIAGNOSIS — J449 Chronic obstructive pulmonary disease, unspecified: Secondary | ICD-10-CM

## 2012-09-28 DIAGNOSIS — J4489 Other specified chronic obstructive pulmonary disease: Secondary | ICD-10-CM

## 2012-09-28 DIAGNOSIS — I428 Other cardiomyopathies: Secondary | ICD-10-CM

## 2012-09-28 LAB — LIPID PANEL: Cholesterol: 255 mg/dL — ABNORMAL HIGH (ref 0–200)

## 2012-09-28 LAB — CBC WITH DIFFERENTIAL/PLATELET
Basophils Relative: 0.5 % (ref 0.0–3.0)
Eosinophils Absolute: 0.4 10*3/uL (ref 0.0–0.7)
Eosinophils Relative: 3.2 % (ref 0.0–5.0)
HCT: 37.6 % — ABNORMAL LOW (ref 39.0–52.0)
Lymphs Abs: 2.5 10*3/uL (ref 0.7–4.0)
MCHC: 34.9 g/dL (ref 30.0–36.0)
MCV: 96.7 fl (ref 78.0–100.0)
Monocytes Absolute: 1.3 10*3/uL — ABNORMAL HIGH (ref 0.1–1.0)
Neutrophils Relative %: 69.2 % (ref 43.0–77.0)
Platelets: 325 10*3/uL (ref 150.0–400.0)

## 2012-09-28 LAB — HEPATIC FUNCTION PANEL
ALT: 128 U/L — ABNORMAL HIGH (ref 0–53)
AST: 80 U/L — ABNORMAL HIGH (ref 0–37)
Albumin: 3 g/dL — ABNORMAL LOW (ref 3.5–5.2)
Total Bilirubin: 5.6 mg/dL — ABNORMAL HIGH (ref 0.3–1.2)

## 2012-09-28 LAB — BASIC METABOLIC PANEL
BUN: 47 mg/dL — ABNORMAL HIGH (ref 6–23)
CO2: 26 mEq/L (ref 19–32)
Chloride: 105 mEq/L (ref 96–112)
Creatinine, Ser: 1.7 mg/dL — ABNORMAL HIGH (ref 0.4–1.5)
Potassium: 3.6 mEq/L (ref 3.5–5.1)

## 2012-09-28 LAB — HEMOGLOBIN A1C: Hgb A1c MFr Bld: 6.6 % — ABNORMAL HIGH (ref 4.6–6.5)

## 2012-09-28 NOTE — Assessment & Plan Note (Signed)
Fairly severe but stable again No changes needed for now

## 2012-09-28 NOTE — Assessment & Plan Note (Signed)
Sugars have been higher Goal under 8%, action >9% May want to just slightly increase long acting insulin

## 2012-09-28 NOTE — Progress Notes (Signed)
Subjective:    Patient ID: Miguel Huber, male    DOB: Sep 05, 1927, 79 y.o.   MRN: [id number]  HPI Breathing has settled down Using the nebulizers bid-tid Occasional cough--dry No fevers Has DOE easily---but  is able to wash some dishes or shower with oxygen off.  Checks sugars twice a day Running higher---many over 200 Is trying to be careful with his eating No hypoglycemic reactions of late  Some chest pain in bed---very brief No palpitations No edema lately  Berenice Primas in the hospital with a stroke!  Current Outpatient Prescriptions on File Prior to Visit  Medication Sig Dispense Refill  . allopurinol (ZYLOPRIM) 100 MG tablet Take 100 mg by mouth daily.       . Fluticasone-Salmeterol (ADVAIR DISKUS) 250-50 MCG/DOSE AEPB Inhale 1 puff into the lungs 2 (two) times daily.        . furosemide (LASIX) 80 MG tablet TAKE 1 TABLET BY MOUTH EVERY DAY  30 tablet  11  . HYDROcodone-homatropine (HYCODAN) 5-1.5 MG/5ML syrup Take 5 mLs by mouth at bedtime as needed for cough.  120 mL  0  . Insulin Syringe-Needle U-100 (B-D INS SYRINGE 0.5CC/30GX1/2") 30G X 1/2" 0.5 ML MISC Use as directed to inject insulin dx: 250.00  100 each  3  . ipratropium-albuterol (DUONEB) 0.5-2.5 (3) MG/3ML SOLN Take 3 mLs by nebulization 4 (four) times daily. Dx: 491.21  360 mL  11  . ketoconazole (NIZORAL) 2 % cream APPLY TOPICALLY TO GROIN RASH TWICE A DAY UNTIL CLEAR  60 g  1  . lisinopril (PRINIVIL,ZESTRIL) 10 MG tablet TAKE 1 TABLET BY MOUTH EVERY DAY  90 tablet  3  . NOVOLIN N 100 UNIT/ML injection INJECT 25 UNITS SUBQ TWO TIMES A DAY  10 mL  11  . NOVOLIN R 100 UNIT/ML injection INJECT 10 UNITS TWO TIMES A DAY  10 mL  6  . omeprazole (PRILOSEC) 20 MG capsule TAKE ONE CAPSULE BY MOUTH EVERY DAY FOR ACID REFLUX  30 capsule  11  . polyethylene glycol (MIRALAX / GLYCOLAX) packet Take 17 g by mouth daily.      . simvastatin (ZOCOR) 20 MG tablet TAKE 1 TABLET BY MOUTH EVERY DAY  30 tablet  11  . temazepam  (RESTORIL) 30 MG capsule TAKE ONE CAPSULE BY MOUTH AT BEDTIME  30 capsule  1  . traMADol (ULTRAM) 50 MG tablet Take 1-2 tablets (50-100 mg total) by mouth at bedtime as needed for pain.  60 tablet  1   No current facility-administered medications on file prior to visit.    Allergies  Allergen Reactions  . Theophyllines     Past Medical History  Diagnosis Date  . Allergic rhinitis   . Hx of adenomatous colonic polyps   . Pulmonary fibrosis   . COPD (chronic obstructive pulmonary disease)   . GERD (gastroesophageal reflux disease)   . Erectile dysfunction   . Cardiomyopathy   . Hyperlipidemia   . Renal insufficiency   . Osteoarthritis   . Diabetes mellitus   . Hypertension   . DVT (deep venous thrombosis), right 5/13    Past Surgical History  Procedure Laterality Date  . Orif clavicle fracture      Left clavicle fracture- child  . Orif ulnar fracture      Left readius/ ulna fracture child  . Lung biopsy      Right lung bx. negative 2001  . Cataract extraction      Cataracts/ IOL OU, [place] 2005, [place] ?  2000  . Cardiovascular stress test      Myoview stress negative EF 65-70% 08/04    Family History  Problem Relation Age of Onset  . Cancer Neg Hx     History   Social History  . Marital Status: Married    Spouse Name: N/A    Number of Children: 3  . Years of Education: N/A   Occupational History  . retired     Ambulance person   Social History Main Topics  . Smoking status: Former Games developer  . Smokeless tobacco: Never Used  . Alcohol Use: Not on file  . Drug Use: Not on file  . Sexually Active: Not on file   Other Topics Concern  . Not on file   Social History Narrative   Has platonic lady lfriend since ~2008  Cleda Daub   Currently seeing Corrie Dandy   Review of Systems Sleeps okay Weight is stable Bowels have been slow--may go 3-4 days. miralax helps. Discussed using regularly instead of waiting     Objective:   Physical Exam  Constitutional: He  appears well-developed and well-nourished. No distress.  Neck: Normal range of motion. Neck supple. No thyromegaly present.  Cardiovascular: Normal rate, regular rhythm and normal heart sounds.  Exam reveals no gallop.   No murmur heard. Faint pulse in right foot, absent in left  Pulmonary/Chest: Effort normal and breath sounds normal. No respiratory distress. He has no wheezes. He has no rales.  Musculoskeletal: He exhibits no edema and no tenderness.  Venous stasis changes in feet  Lymphadenopathy:    He has no cervical adenopathy.  Skin:  No foot ulcers or lesions  Psychiatric: He has a normal mood and affect. His behavior is normal.          Assessment & Plan:

## 2012-09-28 NOTE — Assessment & Plan Note (Signed)
Has done better since diuresed Continue furosemide and other Rx

## 2012-09-29 LAB — LDL CHOLESTEROL, DIRECT: Direct LDL: 165.6 mg/dL

## 2012-10-04 ENCOUNTER — Ambulatory Visit (INDEPENDENT_AMBULATORY_CARE_PROVIDER_SITE_OTHER): Payer: Medicare Other | Admitting: Internal Medicine

## 2012-10-04 ENCOUNTER — Encounter: Payer: Self-pay | Admitting: Internal Medicine

## 2012-10-04 ENCOUNTER — Ambulatory Visit: Payer: Medicare Other | Admitting: Internal Medicine

## 2012-10-04 VITALS — BP 140/80 | HR 61 | Temp 97.7°F | Wt 195.0 lb

## 2012-10-04 DIAGNOSIS — K831 Obstruction of bile duct: Secondary | ICD-10-CM | POA: Insufficient documentation

## 2012-10-04 DIAGNOSIS — K838 Other specified diseases of biliary tract: Secondary | ICD-10-CM | POA: Diagnosis not present

## 2012-10-04 NOTE — Assessment & Plan Note (Signed)
Markedly elevated alk phos and bili (including direct is elevated) Painless Suggests blockage --not from stone  Will start with ultrasound given his renal insufficiency (especially if mass is found, might go right to ERCP)

## 2012-10-04 NOTE — Progress Notes (Signed)
Subjective:    Patient ID: Miguel Huber, male    DOB: November 09, 1927, 79 y.o.   MRN: [id number]  HPI Has been sick all weekend "on the cammode the whole time" Loose stools, no blood but reddish appearance Not the usual brown  No nausea or vomiting Still eating  Current Outpatient Prescriptions on File Prior to Visit  Medication Sig Dispense Refill  . allopurinol (ZYLOPRIM) 100 MG tablet Take 100 mg by mouth daily.       . Fluticasone-Salmeterol (ADVAIR DISKUS) 250-50 MCG/DOSE AEPB Inhale 1 puff into the lungs 2 (two) times daily.        . furosemide (LASIX) 80 MG tablet TAKE 1 TABLET BY MOUTH EVERY DAY  30 tablet  11  . Insulin Syringe-Needle U-100 (B-D INS SYRINGE 0.5CC/30GX1/2") 30G X 1/2" 0.5 ML MISC Use as directed to inject insulin dx: 250.00  100 each  3  . ipratropium-albuterol (DUONEB) 0.5-2.5 (3) MG/3ML SOLN Take 3 mLs by nebulization 4 (four) times daily. Dx: 491.21  360 mL  11  . ketoconazole (NIZORAL) 2 % cream APPLY TOPICALLY TO GROIN RASH TWICE A DAY UNTIL CLEAR  60 g  1  . lisinopril (PRINIVIL,ZESTRIL) 10 MG tablet TAKE 1 TABLET BY MOUTH EVERY DAY  90 tablet  3  . NOVOLIN N 100 UNIT/ML injection INJECT 25 UNITS SUBQ TWO TIMES A DAY  10 mL  11  . NOVOLIN R 100 UNIT/ML injection INJECT 10 UNITS TWO TIMES A DAY  10 mL  6  . omeprazole (PRILOSEC) 20 MG capsule TAKE ONE CAPSULE BY MOUTH EVERY DAY FOR ACID REFLUX  30 capsule  11  . polyethylene glycol (MIRALAX / GLYCOLAX) packet Take 17 g by mouth daily.      . simvastatin (ZOCOR) 20 MG tablet TAKE 1 TABLET BY MOUTH EVERY DAY  30 tablet  11  . temazepam (RESTORIL) 30 MG capsule TAKE ONE CAPSULE BY MOUTH AT BEDTIME  30 capsule  1  . traMADol (ULTRAM) 50 MG tablet Take 1-2 tablets (50-100 mg total) by mouth at bedtime as needed for pain.  60 tablet  1   No current facility-administered medications on file prior to visit.    Allergies  Allergen Reactions  . Theophyllines     Past Medical History  Diagnosis Date  .  Allergic rhinitis   . Hx of adenomatous colonic polyps   . Pulmonary fibrosis   . COPD (chronic obstructive pulmonary disease)   . GERD (gastroesophageal reflux disease)   . Erectile dysfunction   . Cardiomyopathy   . Hyperlipidemia   . Renal insufficiency   . Osteoarthritis   . Diabetes mellitus   . Hypertension   . DVT (deep venous thrombosis), right 5/13    Past Surgical History  Procedure Laterality Date  . Orif clavicle fracture      Left clavicle fracture- child  . Orif ulnar fracture      Left readius/ ulna fracture child  . Lung biopsy      Right lung bx. negative 2001  . Cataract extraction      Cataracts/ IOL OU, [place] 2005, [place] ?  2000  . Cardiovascular stress test      Myoview stress negative EF 65-70% 08/04    Family History  Problem Relation Age of Onset  . Cancer Neg Hx     History   Social History  . Marital Status: Married    Spouse Name: N/A    Number of Children: 3  . Years  of Education: N/A   Occupational History  . retired     Ambulance person   Social History Main Topics  . Smoking status: Former Games developer  . Smokeless tobacco: Never Used  . Alcohol Use: Not on file  . Drug Use: Not on file  . Sexually Active: Not on file   Other Topics Concern  . Not on file   Social History Narrative   Has platonic lady lfriend since ~2008  Cleda Daub   Currently seeing Corrie Dandy   Review of Systems No fever Weight fairly stable    Objective:   Physical Exam  Constitutional: He appears well-developed and well-nourished. No distress.  Eyes:  Sclera are slight icteric (muddy)  Abdominal: Soft. He exhibits no distension and no mass. There is no tenderness. There is no rebound and no guarding.  No HSM  Skin:  No jaundice          Assessment & Plan:

## 2012-10-06 ENCOUNTER — Ambulatory Visit: Payer: Self-pay | Admitting: Internal Medicine

## 2012-10-06 ENCOUNTER — Encounter: Payer: Self-pay | Admitting: Internal Medicine

## 2012-10-06 DIAGNOSIS — Q619 Cystic kidney disease, unspecified: Secondary | ICD-10-CM | POA: Diagnosis not present

## 2012-10-06 DIAGNOSIS — K8020 Calculus of gallbladder without cholecystitis without obstruction: Secondary | ICD-10-CM | POA: Diagnosis not present

## 2012-10-06 DIAGNOSIS — K838 Other specified diseases of biliary tract: Secondary | ICD-10-CM | POA: Diagnosis not present

## 2012-10-12 ENCOUNTER — Encounter: Payer: Self-pay | Admitting: Radiology

## 2012-10-13 ENCOUNTER — Other Ambulatory Visit (INDEPENDENT_AMBULATORY_CARE_PROVIDER_SITE_OTHER): Payer: Medicare Other

## 2012-10-13 DIAGNOSIS — J449 Chronic obstructive pulmonary disease, unspecified: Secondary | ICD-10-CM | POA: Diagnosis not present

## 2012-10-13 DIAGNOSIS — J9610 Chronic respiratory failure, unspecified whether with hypoxia or hypercapnia: Secondary | ICD-10-CM | POA: Diagnosis not present

## 2012-10-13 DIAGNOSIS — Z006 Encounter for examination for normal comparison and control in clinical research program: Secondary | ICD-10-CM | POA: Diagnosis not present

## 2012-10-13 DIAGNOSIS — R748 Abnormal levels of other serum enzymes: Secondary | ICD-10-CM

## 2012-10-13 DIAGNOSIS — J441 Chronic obstructive pulmonary disease with (acute) exacerbation: Secondary | ICD-10-CM | POA: Diagnosis not present

## 2012-10-13 DIAGNOSIS — R0989 Other specified symptoms and signs involving the circulatory and respiratory systems: Secondary | ICD-10-CM | POA: Diagnosis not present

## 2012-10-13 DIAGNOSIS — G4710 Hypersomnia, unspecified: Secondary | ICD-10-CM | POA: Diagnosis not present

## 2012-10-14 ENCOUNTER — Ambulatory Visit (INDEPENDENT_AMBULATORY_CARE_PROVIDER_SITE_OTHER): Payer: Medicare Other | Admitting: Internal Medicine

## 2012-10-14 ENCOUNTER — Encounter: Payer: Self-pay | Admitting: Internal Medicine

## 2012-10-14 VITALS — BP 102/62 | HR 80 | Temp 98.7°F | Wt 192.2 lb

## 2012-10-14 DIAGNOSIS — K831 Obstruction of bile duct: Secondary | ICD-10-CM

## 2012-10-14 DIAGNOSIS — K838 Other specified diseases of biliary tract: Secondary | ICD-10-CM

## 2012-10-14 LAB — HEPATIC FUNCTION PANEL
ALT: 31 U/L (ref 0–53)
Bilirubin, Direct: 1.1 mg/dL — ABNORMAL HIGH (ref 0.0–0.3)
Total Bilirubin: 2.3 mg/dL — ABNORMAL HIGH (ref 0.3–1.2)

## 2012-10-14 NOTE — Assessment & Plan Note (Signed)
Seems to have resolved Asymptomatic Reviewed labs--- liver enzymes, bili and alk phos have mostly normalized Observation only  Repeat labs at follow up

## 2012-10-14 NOTE — Progress Notes (Signed)
Subjective:    Patient ID: Miguel Huber, male    DOB: 1927/10/11, 79 y.o.   MRN: [id number]  HPI Feels better No nausea or vomiting now Eating okay  Has lost some weight Feels he is run down by driving son to [facility] and picking him up daily for work  Berenice Primas is slowly improving from stroke---in [employer] now Visits her on weekends (busy with son during the week)   Current Outpatient Prescriptions on File Prior to Visit  Medication Sig Dispense Refill  . allopurinol (ZYLOPRIM) 100 MG tablet Take 100 mg by mouth daily.       . Fluticasone-Salmeterol (ADVAIR DISKUS) 250-50 MCG/DOSE AEPB Inhale 1 puff into the lungs 2 (two) times daily.        . furosemide (LASIX) 80 MG tablet TAKE 1 TABLET BY MOUTH EVERY DAY  30 tablet  11  . Insulin Syringe-Needle U-100 (B-D INS SYRINGE 0.5CC/30GX1/2") 30G X 1/2" 0.5 ML MISC Use as directed to inject insulin dx: 250.00  100 each  3  . ipratropium-albuterol (DUONEB) 0.5-2.5 (3) MG/3ML SOLN Take 3 mLs by nebulization 4 (four) times daily. Dx: 491.21  360 mL  11  . ketoconazole (NIZORAL) 2 % cream APPLY TOPICALLY TO GROIN RASH TWICE A DAY UNTIL CLEAR  60 g  1  . lisinopril (PRINIVIL,ZESTRIL) 10 MG tablet TAKE 1 TABLET BY MOUTH EVERY DAY  90 tablet  3  . NOVOLIN N 100 UNIT/ML injection INJECT 25 UNITS SUBQ TWO TIMES A DAY  10 mL  11  . NOVOLIN R 100 UNIT/ML injection INJECT 10 UNITS TWO TIMES A DAY  10 mL  6  . omeprazole (PRILOSEC) 20 MG capsule TAKE ONE CAPSULE BY MOUTH EVERY DAY FOR ACID REFLUX  30 capsule  11  . polyethylene glycol (MIRALAX / GLYCOLAX) packet Take 17 g by mouth daily.      . simvastatin (ZOCOR) 20 MG tablet TAKE 1 TABLET BY MOUTH EVERY DAY  30 tablet  11  . temazepam (RESTORIL) 30 MG capsule TAKE ONE CAPSULE BY MOUTH AT BEDTIME  30 capsule  1  . traMADol (ULTRAM) 50 MG tablet Take 1-2 tablets (50-100 mg total) by mouth at bedtime as needed for pain.  60 tablet  1   No current facility-administered medications on  file prior to visit.    Allergies  Allergen Reactions  . Theophyllines     Past Medical History  Diagnosis Date  . Allergic rhinitis   . Hx of adenomatous colonic polyps   . Pulmonary fibrosis   . COPD (chronic obstructive pulmonary disease)   . GERD (gastroesophageal reflux disease)   . Erectile dysfunction   . Cardiomyopathy   . Hyperlipidemia   . Renal insufficiency   . Osteoarthritis   . Diabetes mellitus   . Hypertension   . DVT (deep venous thrombosis), right 5/13    Past Surgical History  Procedure Laterality Date  . Orif clavicle fracture      Left clavicle fracture- child  . Orif ulnar fracture      Left readius/ ulna fracture child  . Lung biopsy      Right lung bx. negative 2001  . Cataract extraction      Cataracts/ IOL OU, Ep[place] 2005, [place] ?  2000  . Cardiovascular stress test      Myoview stress negative EF 65-70% 08/04    Family History  Problem Relation Age of Onset  . Cancer Neg Hx     History  Social History  . Marital Status: Married    Spouse Name: N/A    Number of Children: 3  . Years of Education: N/A   Occupational History  . retired     Ambulance person   Social History Main Topics  . Smoking status: Former Games developer  . Smokeless tobacco: Never Used  . Alcohol Use: Not on file  . Drug Use: Not on file  . Sexually Active: Not on file   Other Topics Concern  . Not on file   Social History Narrative   Has platonic lady lfriend since ~2008  Cleda Daub   Currently seeing Corrie Dandy   Review of Systems Sugars have been fine No hypoglycemic reactions    Objective:   Physical Exam  Constitutional: He appears well-developed and well-nourished. No distress.  Neck: Normal range of motion.  Pulmonary/Chest: Effort normal and breath sounds normal. No respiratory distress. He has no wheezes. He has no rales. He exhibits tenderness.  Abdominal: Soft. He exhibits no mass. There is no tenderness.  Lymphadenopathy:    He has no cervical  adenopathy.          Assessment & Plan:

## 2012-10-15 DIAGNOSIS — Z79899 Other long term (current) drug therapy: Secondary | ICD-10-CM | POA: Diagnosis not present

## 2012-10-25 ENCOUNTER — Other Ambulatory Visit: Payer: Self-pay | Admitting: Internal Medicine

## 2012-10-25 NOTE — Telephone Encounter (Signed)
rx called into pharmacy

## 2012-10-25 NOTE — Telephone Encounter (Signed)
Okay #30 x 1 

## 2012-10-26 ENCOUNTER — Encounter: Payer: Self-pay | Admitting: Internal Medicine

## 2012-11-04 DIAGNOSIS — M109 Gout, unspecified: Secondary | ICD-10-CM | POA: Diagnosis not present

## 2012-11-04 DIAGNOSIS — I10 Essential (primary) hypertension: Secondary | ICD-10-CM | POA: Diagnosis not present

## 2012-11-04 DIAGNOSIS — N039 Chronic nephritic syndrome with unspecified morphologic changes: Secondary | ICD-10-CM | POA: Diagnosis not present

## 2012-11-04 DIAGNOSIS — N2581 Secondary hyperparathyroidism of renal origin: Secondary | ICD-10-CM | POA: Diagnosis not present

## 2012-11-04 DIAGNOSIS — R809 Proteinuria, unspecified: Secondary | ICD-10-CM | POA: Diagnosis not present

## 2012-11-11 DIAGNOSIS — L570 Actinic keratosis: Secondary | ICD-10-CM | POA: Diagnosis not present

## 2012-11-11 DIAGNOSIS — L259 Unspecified contact dermatitis, unspecified cause: Secondary | ICD-10-CM | POA: Diagnosis not present

## 2012-11-12 IMAGING — CR DG CHEST 2V
1 series · 2 of 2 positions shown · non-contrast
Comparison: none

REASON FOR EXAM: cough
COMMENTS:

[Series 1: pa · 0.17mm/px · 2 of 2 slices shown]
[im 1/2]
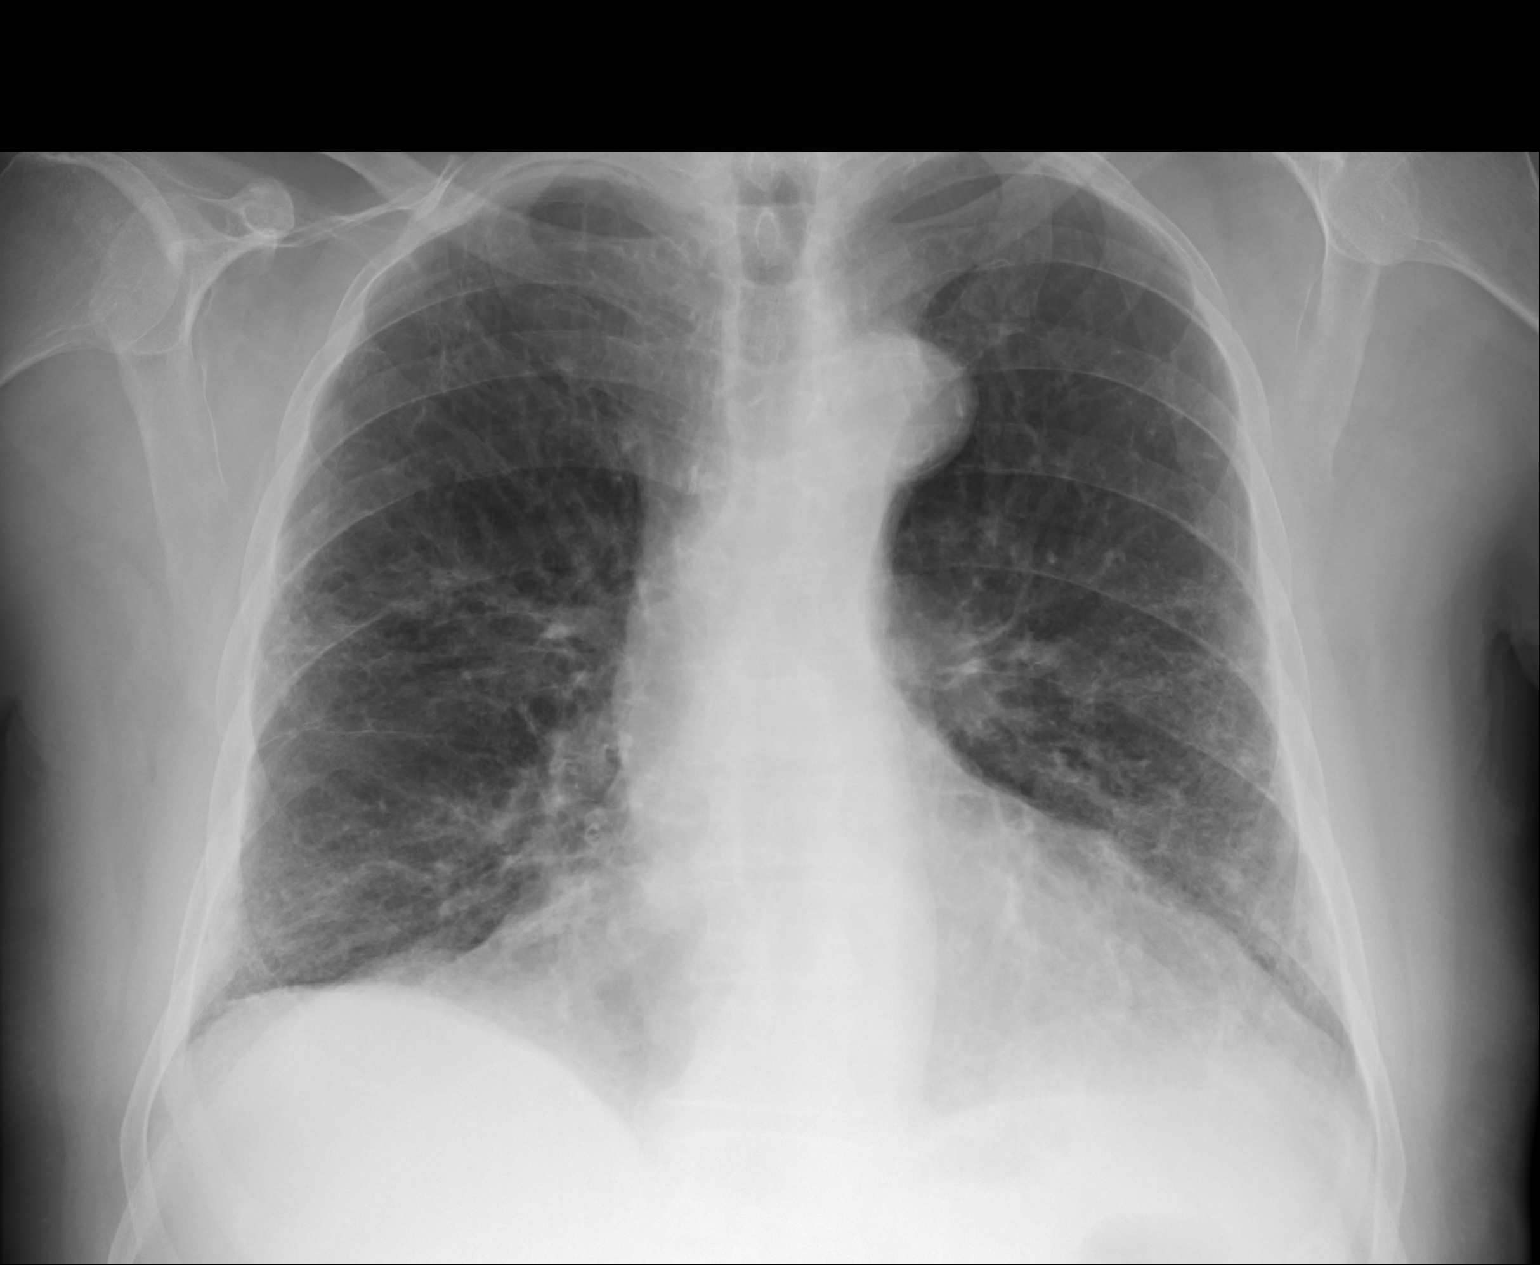
[im 2/2]
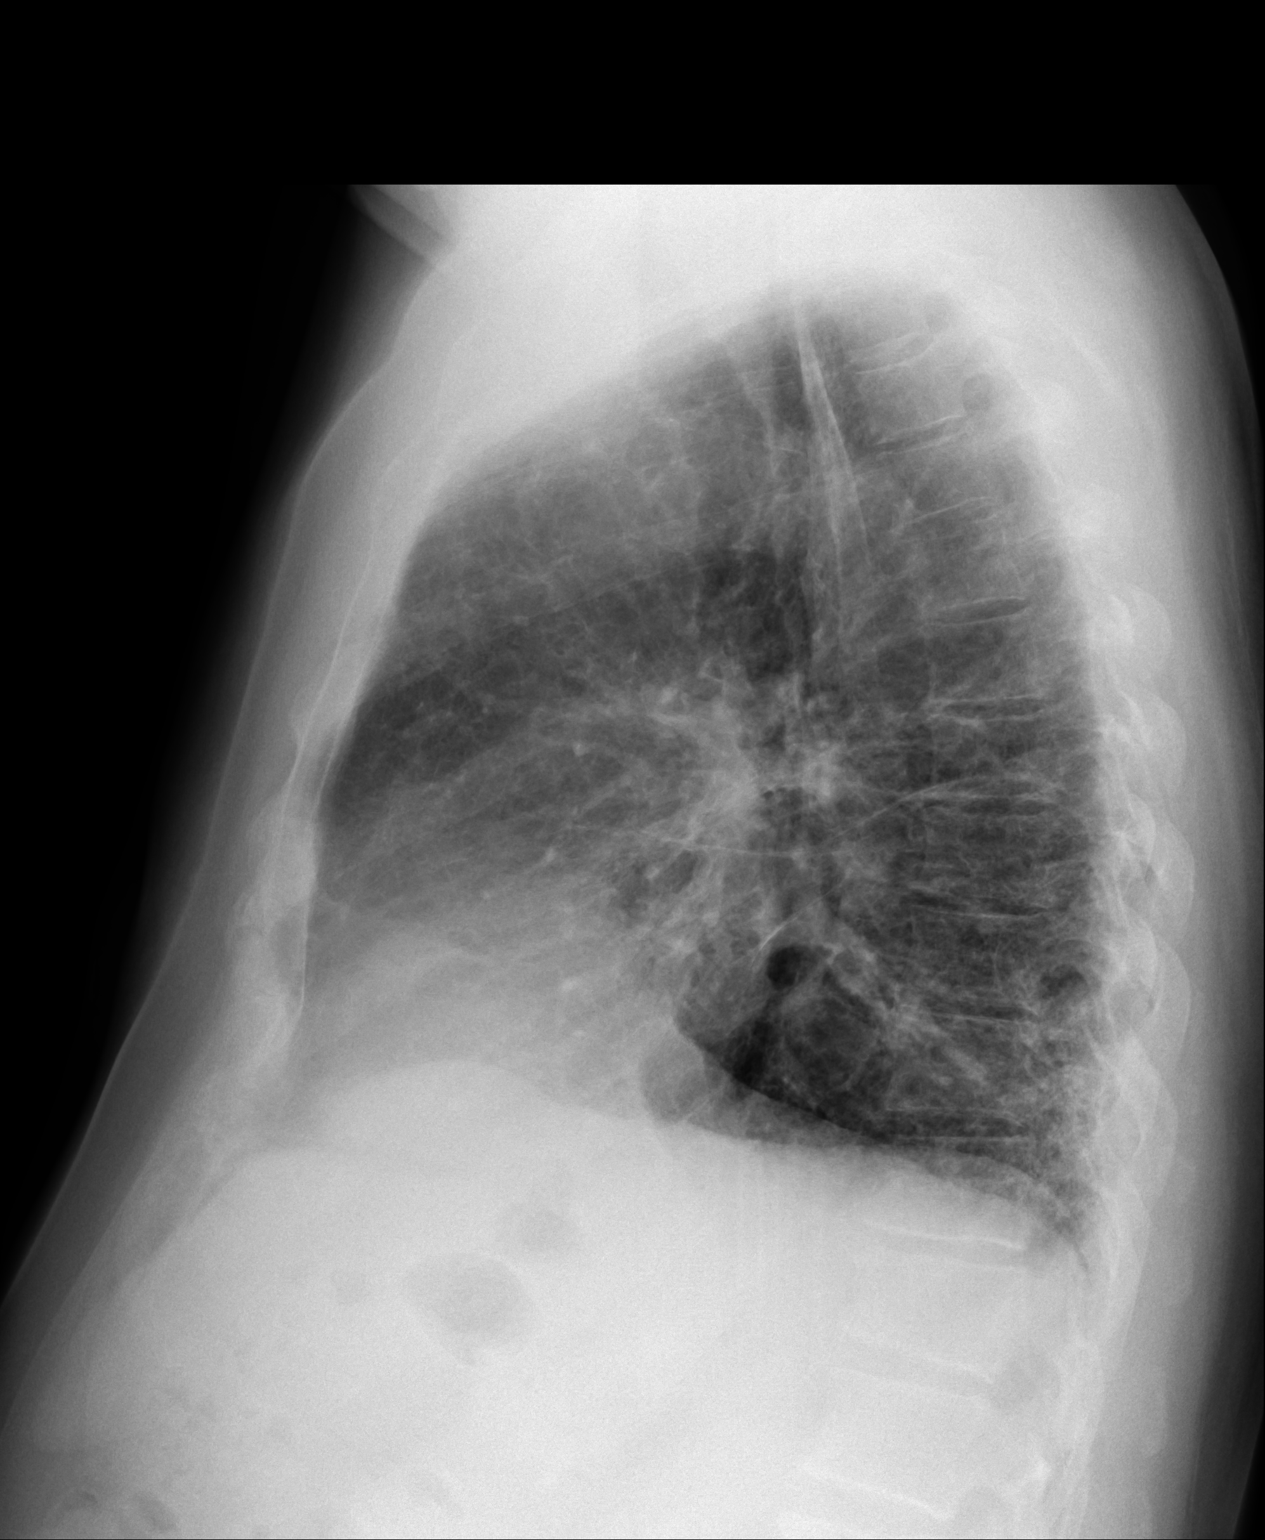

[2 of 2 positions shown; findings below may reference images not displayed]

PROCEDURE:     DXR - DXR CHEST PA (OR AP) AND LATERAL  - December 18, 2011 [DATE]

RESULT:     Comparison is made to the study 30 May, 2009.

The lungs are well-expanded. The interstitial markings are increased though
stable. The cardiac silhouette is normal in size. The pulmonary vascularity
is not engorged.
IMPRESSION: There are findings which suggest pulmonary fibrosis an
underlying COPD. There is no objective evidence of pneumonia. There may be
bibasilar atelectasis. Followup films following therapy would be of value.

## 2012-11-22 ENCOUNTER — Encounter: Payer: Self-pay | Admitting: Nephrology

## 2012-12-02 ENCOUNTER — Other Ambulatory Visit: Payer: Self-pay

## 2012-12-22 ENCOUNTER — Other Ambulatory Visit: Payer: Self-pay | Admitting: Internal Medicine

## 2012-12-22 NOTE — Telephone Encounter (Signed)
rx called into pharmacy

## 2012-12-22 NOTE — Telephone Encounter (Signed)
Okay #30 x 0 

## 2013-01-03 DIAGNOSIS — L851 Acquired keratosis [keratoderma] palmaris et plantaris: Secondary | ICD-10-CM | POA: Diagnosis not present

## 2013-01-03 DIAGNOSIS — B351 Tinea unguium: Secondary | ICD-10-CM | POA: Diagnosis not present

## 2013-01-03 DIAGNOSIS — G909 Disorder of the autonomic nervous system, unspecified: Secondary | ICD-10-CM | POA: Diagnosis not present

## 2013-01-03 DIAGNOSIS — E1149 Type 2 diabetes mellitus with other diabetic neurological complication: Secondary | ICD-10-CM | POA: Diagnosis not present

## 2013-01-17 ENCOUNTER — Other Ambulatory Visit: Payer: Self-pay | Admitting: Internal Medicine

## 2013-01-17 NOTE — Telephone Encounter (Signed)
rx called into pharmacy

## 2013-01-17 NOTE — Telephone Encounter (Signed)
Okay #30 x 0 Let him know it is a few days early

## 2013-01-20 DIAGNOSIS — H40009 Preglaucoma, unspecified, unspecified eye: Secondary | ICD-10-CM | POA: Diagnosis not present

## 2013-01-25 DIAGNOSIS — M999 Biomechanical lesion, unspecified: Secondary | ICD-10-CM | POA: Diagnosis not present

## 2013-01-25 DIAGNOSIS — M545 Low back pain: Secondary | ICD-10-CM | POA: Diagnosis not present

## 2013-01-25 DIAGNOSIS — M533 Sacrococcygeal disorders, not elsewhere classified: Secondary | ICD-10-CM | POA: Diagnosis not present

## 2013-01-31 ENCOUNTER — Ambulatory Visit (INDEPENDENT_AMBULATORY_CARE_PROVIDER_SITE_OTHER): Payer: Medicare Other | Admitting: Internal Medicine

## 2013-01-31 ENCOUNTER — Encounter: Payer: Self-pay | Admitting: Internal Medicine

## 2013-01-31 VITALS — BP 110/68 | HR 76 | Temp 98.1°F | Wt 197.0 lb

## 2013-01-31 DIAGNOSIS — J4489 Other specified chronic obstructive pulmonary disease: Secondary | ICD-10-CM

## 2013-01-31 DIAGNOSIS — E1129 Type 2 diabetes mellitus with other diabetic kidney complication: Secondary | ICD-10-CM | POA: Diagnosis not present

## 2013-01-31 DIAGNOSIS — I10 Essential (primary) hypertension: Secondary | ICD-10-CM | POA: Diagnosis not present

## 2013-01-31 DIAGNOSIS — I428 Other cardiomyopathies: Secondary | ICD-10-CM

## 2013-01-31 DIAGNOSIS — J449 Chronic obstructive pulmonary disease, unspecified: Secondary | ICD-10-CM | POA: Diagnosis not present

## 2013-01-31 LAB — HEMOGLOBIN A1C: Hgb A1c MFr Bld: 7.5 % — ABNORMAL HIGH (ref 4.6–6.5)

## 2013-01-31 NOTE — Assessment & Plan Note (Signed)
BP Readings from Last 3 Encounters:  01/31/13 110/68  10/14/12 102/62  10/04/12 140/80   Good control

## 2013-01-31 NOTE — Assessment & Plan Note (Signed)
No recent exacerbations No changes needed

## 2013-01-31 NOTE — Assessment & Plan Note (Signed)
Stable DOE Likely diastolic dysfunction Continues on furosemide

## 2013-01-31 NOTE — Progress Notes (Signed)
Subjective:    Patient ID: Miguel Huber, male    DOB: February 16, 1928, 79 y.o.   MRN: [id number]  HPI Feeling fine Berenice Primas is doing some better--speaking some and using walker. Still at [employer] ([place] now)  Breathing is fair No fever No sig cough DOE is stable Still feels the nebulizer helps---not really coughing now. Only uses daily now for the most part  Checks sugars bid Average 160 --actually higher in AM than afternoon No sig hypoglycemic reactions-- did have a 70 reading (but ?only slight shaking)  No chest pain No palpitations No headaches No recent edema  Current Outpatient Prescriptions on File Prior to Visit  Medication Sig Dispense Refill  . allopurinol (ZYLOPRIM) 100 MG tablet Take 100 mg by mouth daily.       . Fluticasone-Salmeterol (ADVAIR DISKUS) 250-50 MCG/DOSE AEPB Inhale 1 puff into the lungs 2 (two) times daily.        . furosemide (LASIX) 80 MG tablet TAKE 1 TABLET BY MOUTH EVERY DAY  30 tablet  11  . Insulin Syringe-Needle U-100 (B-D INS SYRINGE 0.5CC/30GX1/2") 30G X 1/2" 0.5 ML MISC Use as directed to inject insulin dx: 250.00  100 each  3  . ipratropium-albuterol (DUONEB) 0.5-2.5 (3) MG/3ML SOLN Take 3 mLs by nebulization 4 (four) times daily. Dx: 491.21  360 mL  11  . ketoconazole (NIZORAL) 2 % cream APPLY TOPICALLY TO GROIN RASH TWICE A DAY UNTIL CLEAR  60 g  1  . lisinopril (PRINIVIL,ZESTRIL) 10 MG tablet TAKE 1 TABLET BY MOUTH EVERY DAY  90 tablet  3  . NOVOLIN N 100 UNIT/ML injection INJECT 25 UNITS SUBQ TWO TIMES A DAY  10 mL  11  . NOVOLIN R 100 UNIT/ML injection INJECT 10 UNITS TWO TIMES A DAY  10 mL  6  . omeprazole (PRILOSEC) 20 MG capsule TAKE ONE CAPSULE BY MOUTH EVERY DAY FOR ACID REFLUX  30 capsule  11  . polyethylene glycol (MIRALAX / GLYCOLAX) packet Take 17 g by mouth daily.      . simvastatin (ZOCOR) 20 MG tablet TAKE 1 TABLET BY MOUTH EVERY DAY  30 tablet  11  . temazepam (RESTORIL) 30 MG capsule TAKE ONE CAPSULE BY MOUTH AT  BEDTIME  30 capsule  0  . traMADol (ULTRAM) 50 MG tablet Take 1-2 tablets (50-100 mg total) by mouth at bedtime as needed for pain.  60 tablet  1   No current facility-administered medications on file prior to visit.    Allergies  Allergen Reactions  . Theophyllines     Past Medical History  Diagnosis Date  . Allergic rhinitis   . Hx of adenomatous colonic polyps   . Pulmonary fibrosis   . COPD (chronic obstructive pulmonary disease)   . GERD (gastroesophageal reflux disease)   . Erectile dysfunction   . Cardiomyopathy   . Hyperlipidemia   . Renal insufficiency   . Osteoarthritis   . Diabetes mellitus   . Hypertension   . DVT (deep venous thrombosis), right 5/13    Past Surgical History  Procedure Laterality Date  . Orif clavicle fracture      Left clavicle fracture- child  . Orif ulnar fracture      Left readius/ ulna fracture child  . Lung biopsy      Right lung bx. negative 2001  . Cataract extraction      Cataracts/ IOL OU, [place] 2005, [place] ?  2000  . Cardiovascular stress test  Myoview stress negative EF 65-70% 08/04    Family History  Problem Relation Age of Onset  . Cancer Neg Hx     History   Social History  . Marital Status: Married    Spouse Name: N/A    Number of Children: 3  . Years of Education: N/A   Occupational History  . retired     Ambulance person   Social History Main Topics  . Smoking status: Former Games developer  . Smokeless tobacco: Never Used  . Alcohol Use: Not on file  . Drug Use: Not on file  . Sexual Activity: Not on file   Other Topics Concern  . Not on file   Social History Narrative   Has platonic lady lfriend since ~2008  Cleda Daub   Currently seeing Corrie Dandy   Review of Systems Appetite is good Weight up 5# Uses miralax for bowels Voids fine    Objective:   Physical Exam  Constitutional: He appears well-developed and well-nourished. No distress.  Neck: Normal range of motion. Neck supple. No thyromegaly  present.  Cardiovascular: Normal rate, regular rhythm and normal heart sounds.  Exam reveals no gallop.   No murmur heard. Faint pulse left foot-- ?absent on right (but good perfusion)  Pulmonary/Chest: Effort normal. No respiratory distress. He has no wheezes. He has no rales.  Decreased breath sounds but clear  Abdominal: Soft. There is no tenderness.  Musculoskeletal: He exhibits no edema.  Lymphadenopathy:    He has no cervical adenopathy.  Skin:  No foot ulcers  Psychiatric: He has a normal mood and affect. His behavior is normal.          Assessment & Plan:

## 2013-01-31 NOTE — Assessment & Plan Note (Signed)
fingersticks up some Will check A1c again No change though

## 2013-02-02 ENCOUNTER — Encounter: Payer: Self-pay | Admitting: Family Medicine

## 2013-02-10 DIAGNOSIS — J449 Chronic obstructive pulmonary disease, unspecified: Secondary | ICD-10-CM | POA: Diagnosis not present

## 2013-02-10 DIAGNOSIS — J8410 Pulmonary fibrosis, unspecified: Secondary | ICD-10-CM | POA: Diagnosis not present

## 2013-02-14 DIAGNOSIS — M533 Sacrococcygeal disorders, not elsewhere classified: Secondary | ICD-10-CM | POA: Diagnosis not present

## 2013-02-14 DIAGNOSIS — M545 Low back pain: Secondary | ICD-10-CM | POA: Diagnosis not present

## 2013-02-14 DIAGNOSIS — M999 Biomechanical lesion, unspecified: Secondary | ICD-10-CM | POA: Diagnosis not present

## 2013-02-15 ENCOUNTER — Telehealth: Payer: Self-pay

## 2013-02-15 DIAGNOSIS — M533 Sacrococcygeal disorders, not elsewhere classified: Secondary | ICD-10-CM | POA: Diagnosis not present

## 2013-02-15 DIAGNOSIS — M999 Biomechanical lesion, unspecified: Secondary | ICD-10-CM | POA: Diagnosis not present

## 2013-02-15 DIAGNOSIS — M545 Low back pain: Secondary | ICD-10-CM | POA: Diagnosis not present

## 2013-02-15 NOTE — Telephone Encounter (Signed)
Stephanie left v/m requesting cb. No other message left. Left v/m requesting Judeth Cornfield to cb.

## 2013-02-17 DIAGNOSIS — M545 Low back pain: Secondary | ICD-10-CM | POA: Diagnosis not present

## 2013-02-17 DIAGNOSIS — M533 Sacrococcygeal disorders, not elsewhere classified: Secondary | ICD-10-CM | POA: Diagnosis not present

## 2013-02-17 DIAGNOSIS — M999 Biomechanical lesion, unspecified: Secondary | ICD-10-CM | POA: Diagnosis not present

## 2013-02-21 ENCOUNTER — Other Ambulatory Visit: Payer: Self-pay | Admitting: Internal Medicine

## 2013-02-21 DIAGNOSIS — M999 Biomechanical lesion, unspecified: Secondary | ICD-10-CM | POA: Diagnosis not present

## 2013-02-21 DIAGNOSIS — M545 Low back pain: Secondary | ICD-10-CM | POA: Diagnosis not present

## 2013-02-21 DIAGNOSIS — M533 Sacrococcygeal disorders, not elsewhere classified: Secondary | ICD-10-CM | POA: Diagnosis not present

## 2013-02-21 NOTE — Telephone Encounter (Signed)
rx called into pharmacy

## 2013-02-21 NOTE — Telephone Encounter (Signed)
Last filled 01/17/13

## 2013-02-21 NOTE — Telephone Encounter (Signed)
Judeth Cornfield said pt is ordering diabetic supplies for the first time thru [facility]; Judeth Cornfield will fax a request.

## 2013-02-21 NOTE — Telephone Encounter (Signed)
Okay #30 x 0 

## 2013-03-21 ENCOUNTER — Other Ambulatory Visit: Payer: Self-pay | Admitting: Internal Medicine

## 2013-03-21 NOTE — Telephone Encounter (Signed)
Last filled 02/21/2013 

## 2013-03-21 NOTE — Telephone Encounter (Signed)
rx called into pharmacy

## 2013-03-21 NOTE — Telephone Encounter (Signed)
Okay #30 x 0 

## 2013-03-23 DIAGNOSIS — R0602 Shortness of breath: Secondary | ICD-10-CM | POA: Diagnosis not present

## 2013-03-23 DIAGNOSIS — Z23 Encounter for immunization: Secondary | ICD-10-CM | POA: Diagnosis not present

## 2013-03-25 ENCOUNTER — Other Ambulatory Visit: Payer: Self-pay | Admitting: Internal Medicine

## 2013-04-25 ENCOUNTER — Other Ambulatory Visit: Payer: Self-pay | Admitting: Internal Medicine

## 2013-04-25 NOTE — Telephone Encounter (Signed)
rx called into pharmacy

## 2013-04-25 NOTE — Telephone Encounter (Signed)
Okay #30 x 0 

## 2013-04-25 NOTE — Telephone Encounter (Signed)
Last refilled 03/21/13

## 2013-05-10 DIAGNOSIS — J438 Other emphysema: Secondary | ICD-10-CM | POA: Diagnosis not present

## 2013-05-10 DIAGNOSIS — J449 Chronic obstructive pulmonary disease, unspecified: Secondary | ICD-10-CM | POA: Diagnosis not present

## 2013-05-10 DIAGNOSIS — J9610 Chronic respiratory failure, unspecified whether with hypoxia or hypercapnia: Secondary | ICD-10-CM | POA: Diagnosis not present

## 2013-05-13 DIAGNOSIS — L570 Actinic keratosis: Secondary | ICD-10-CM | POA: Diagnosis not present

## 2013-05-13 DIAGNOSIS — Z85828 Personal history of other malignant neoplasm of skin: Secondary | ICD-10-CM | POA: Diagnosis not present

## 2013-05-23 ENCOUNTER — Other Ambulatory Visit: Payer: Self-pay | Admitting: Internal Medicine

## 2013-05-23 DIAGNOSIS — M109 Gout, unspecified: Secondary | ICD-10-CM | POA: Diagnosis not present

## 2013-05-23 DIAGNOSIS — R809 Proteinuria, unspecified: Secondary | ICD-10-CM | POA: Diagnosis not present

## 2013-05-23 DIAGNOSIS — N2581 Secondary hyperparathyroidism of renal origin: Secondary | ICD-10-CM | POA: Diagnosis not present

## 2013-05-23 DIAGNOSIS — I10 Essential (primary) hypertension: Secondary | ICD-10-CM | POA: Diagnosis not present

## 2013-05-23 DIAGNOSIS — D631 Anemia in chronic kidney disease: Secondary | ICD-10-CM | POA: Diagnosis not present

## 2013-05-24 ENCOUNTER — Other Ambulatory Visit: Payer: Self-pay | Admitting: Internal Medicine

## 2013-05-24 NOTE — Telephone Encounter (Signed)
Last filled 04/25/13

## 2013-05-24 NOTE — Telephone Encounter (Signed)
rx called into pharmacy

## 2013-05-24 NOTE — Telephone Encounter (Signed)
Okay #30 x 0 

## 2013-05-30 ENCOUNTER — Telehealth: Payer: Self-pay | Admitting: *Deleted

## 2013-05-30 NOTE — Telephone Encounter (Signed)
Medication alert form in your IN box for review

## 2013-05-31 NOTE — Telephone Encounter (Signed)
They are expressing concern about the temazepam which I understand---but it is still probably his best choice at this point.

## 2013-06-03 DIAGNOSIS — S46909A Unspecified injury of unspecified muscle, fascia and tendon at shoulder and upper arm level, unspecified arm, initial encounter: Secondary | ICD-10-CM | POA: Diagnosis not present

## 2013-06-03 DIAGNOSIS — M25519 Pain in unspecified shoulder: Secondary | ICD-10-CM | POA: Diagnosis not present

## 2013-06-03 DIAGNOSIS — Z880 Allergy status to penicillin: Secondary | ICD-10-CM | POA: Diagnosis not present

## 2013-06-03 DIAGNOSIS — S4980XA Other specified injuries of shoulder and upper arm, unspecified arm, initial encounter: Secondary | ICD-10-CM | POA: Diagnosis not present

## 2013-06-20 ENCOUNTER — Other Ambulatory Visit: Payer: Self-pay | Admitting: Internal Medicine

## 2013-06-21 NOTE — Telephone Encounter (Signed)
05/23/13 

## 2013-06-21 NOTE — Telephone Encounter (Signed)
Okay #30 x 0 

## 2013-06-21 NOTE — Telephone Encounter (Signed)
rx called into pharmacy

## 2013-06-24 ENCOUNTER — Other Ambulatory Visit: Payer: Self-pay | Admitting: Internal Medicine

## 2013-06-29 ENCOUNTER — Telehealth: Payer: Self-pay

## 2013-06-29 NOTE — Telephone Encounter (Signed)
Miguel Huber with [facility] requesting status of fax for back brace. Dee, Dr Alla German CMA said already faxed back to [facility] and at this time Dr Silvio Pate does not think pt needs back brace. Miguel Huber voiced understanding.

## 2013-07-11 DIAGNOSIS — G909 Disorder of the autonomic nervous system, unspecified: Secondary | ICD-10-CM | POA: Diagnosis not present

## 2013-07-11 DIAGNOSIS — B351 Tinea unguium: Secondary | ICD-10-CM | POA: Diagnosis not present

## 2013-07-11 DIAGNOSIS — M79609 Pain in unspecified limb: Secondary | ICD-10-CM | POA: Diagnosis not present

## 2013-07-11 DIAGNOSIS — E1149 Type 2 diabetes mellitus with other diabetic neurological complication: Secondary | ICD-10-CM | POA: Diagnosis not present

## 2013-07-20 ENCOUNTER — Other Ambulatory Visit: Payer: Self-pay | Admitting: Internal Medicine

## 2013-07-22 ENCOUNTER — Other Ambulatory Visit: Payer: Self-pay | Admitting: Internal Medicine

## 2013-07-22 NOTE — Telephone Encounter (Signed)
Last filled 06/20/13

## 2013-07-23 NOTE — Telephone Encounter (Signed)
Okay #30 x 0 

## 2013-07-25 NOTE — Telephone Encounter (Signed)
rx called into pharmacy

## 2013-07-26 DIAGNOSIS — H40009 Preglaucoma, unspecified, unspecified eye: Secondary | ICD-10-CM | POA: Diagnosis not present

## 2013-07-26 LAB — HM DIABETES EYE EXAM

## 2013-08-02 ENCOUNTER — Encounter: Payer: Self-pay | Admitting: Internal Medicine

## 2013-08-02 ENCOUNTER — Ambulatory Visit (INDEPENDENT_AMBULATORY_CARE_PROVIDER_SITE_OTHER): Payer: Medicare Other | Admitting: Internal Medicine

## 2013-08-02 VITALS — BP 120/68 | HR 74 | Temp 98.0°F | Ht 67.5 in | Wt 198.5 lb

## 2013-08-02 DIAGNOSIS — N183 Chronic kidney disease, stage 3 unspecified: Secondary | ICD-10-CM

## 2013-08-02 DIAGNOSIS — E1142 Type 2 diabetes mellitus with diabetic polyneuropathy: Secondary | ICD-10-CM

## 2013-08-02 DIAGNOSIS — Z23 Encounter for immunization: Secondary | ICD-10-CM

## 2013-08-02 DIAGNOSIS — E785 Hyperlipidemia, unspecified: Secondary | ICD-10-CM

## 2013-08-02 DIAGNOSIS — J449 Chronic obstructive pulmonary disease, unspecified: Secondary | ICD-10-CM | POA: Diagnosis not present

## 2013-08-02 DIAGNOSIS — E1149 Type 2 diabetes mellitus with other diabetic neurological complication: Secondary | ICD-10-CM

## 2013-08-02 DIAGNOSIS — E1129 Type 2 diabetes mellitus with other diabetic kidney complication: Secondary | ICD-10-CM

## 2013-08-02 DIAGNOSIS — I428 Other cardiomyopathies: Secondary | ICD-10-CM

## 2013-08-02 DIAGNOSIS — G629 Polyneuropathy, unspecified: Secondary | ICD-10-CM | POA: Insufficient documentation

## 2013-08-02 DIAGNOSIS — Z0000 Encounter for general adult medical examination without abnormal findings: Secondary | ICD-10-CM | POA: Diagnosis not present

## 2013-08-02 DIAGNOSIS — I10 Essential (primary) hypertension: Secondary | ICD-10-CM

## 2013-08-02 LAB — COMPREHENSIVE METABOLIC PANEL
ALBUMIN: 4 g/dL (ref 3.5–5.2)
ALT: 17 U/L (ref 0–53)
AST: 17 U/L (ref 0–37)
Alkaline Phosphatase: 100 U/L (ref 39–117)
BUN: 34 mg/dL — AB (ref 6–23)
CALCIUM: 9.3 mg/dL (ref 8.4–10.5)
CHLORIDE: 109 meq/L (ref 96–112)
CO2: 30 mEq/L (ref 19–32)
Creatinine, Ser: 1.4 mg/dL (ref 0.4–1.5)
GFR: 49.75 mL/min — AB (ref >60.00)
Glucose, Bld: 159 mg/dL — ABNORMAL HIGH (ref 70–99)
Potassium: 3.9 mEq/L (ref 3.5–5.1)
Sodium: 145 mEq/L (ref 135–145)
TOTAL PROTEIN: 7.3 g/dL (ref 6.0–8.3)
Total Bilirubin: 0.7 mg/dL (ref 0.3–1.2)

## 2013-08-02 LAB — CBC WITH DIFFERENTIAL/PLATELET
Basophils Absolute: 0.1 10*3/uL (ref 0.0–0.1)
Basophils Relative: 0.7 % (ref 0.0–3.0)
Eosinophils Absolute: 0.3 10*3/uL (ref 0.0–0.7)
Eosinophils Relative: 3.7 % (ref 0.0–5.0)
HCT: 44 % (ref 39.0–52.0)
Hemoglobin: 15.1 g/dL (ref 13.0–17.0)
Lymphocytes Relative: 30.2 % (ref 12.0–46.0)
Lymphs Abs: 2.6 10*3/uL (ref 0.7–4.0)
MCHC: 34.3 g/dL (ref 30.0–36.0)
MCV: 96.2 fl (ref 78.0–100.0)
MONOS PCT: 7.8 % (ref 3.0–12.0)
Monocytes Absolute: 0.7 10*3/uL (ref 0.1–1.0)
NEUTROS PCT: 57.6 % (ref 43.0–77.0)
Neutro Abs: 4.9 10*3/uL (ref 1.4–7.7)
PLATELETS: 218 10*3/uL (ref 150.0–400.0)
RBC: 4.57 Mil/uL (ref 4.22–5.81)
RDW: 13.7 % (ref 11.5–14.6)
WBC: 8.5 10*3/uL (ref 4.5–10.5)

## 2013-08-02 LAB — LIPID PANEL
CHOLESTEROL: 116 mg/dL (ref 0–200)
HDL: 38.1 mg/dL — AB (ref >39.00)
LDL Cholesterol: 50 mg/dL (ref 0–99)
Total CHOL/HDL Ratio: 3
Triglycerides: 141 mg/dL (ref 0.0–149.0)
VLDL: 28.2 mg/dL (ref 0.0–40.0)

## 2013-08-02 LAB — TSH: TSH: 0.72 u[IU]/mL (ref 0.35–5.50)

## 2013-08-02 LAB — T4, FREE: FREE T4: 0.65 ng/dL (ref 0.60–1.60)

## 2013-08-02 LAB — HM DIABETES FOOT EXAM

## 2013-08-02 LAB — HEMOGLOBIN A1C: Hgb A1c MFr Bld: 8.2 % — ABNORMAL HIGH (ref 4.6–6.5)

## 2013-08-02 MED ORDER — ROSUVASTATIN CALCIUM 20 MG PO TABS: 20.0000 mg | ORAL_TABLET | Freq: Every day | ORAL | Status: DC

## 2013-08-02 NOTE — Assessment & Plan Note (Signed)
I have personally reviewed the Medicare Annual Wellness questionnaire and have noted 1. The patient's medical and social history 2. Their use of alcohol, tobacco or illicit drugs 3. Their current medications and supplements 4. The patient's functional ability including ADL's, fall risks, home safety risks and hearing or visual             impairment. 5. Diet and physical activities 6. Evidence for depression or mood disorders  The patients weight, height, BMI and visual acuity have been recorded in the chart I have made referrals, counseling and provided education to the patient based review of the above and I have provided the pt with a written personalized care plan for preventive services.  I have provided you with a copy of your personalized plan for preventive services. Please take the time to review along with your updated medication list.  Will give prevnar No Td--doesn't have the money to pay Mild cognitive problems but no functional issues

## 2013-08-02 NOTE — Progress Notes (Signed)
Pre visit review using our clinic review tool, if applicable. No additional management support is needed unless otherwise documented below in the visit note. 

## 2013-08-02 NOTE — Assessment & Plan Note (Signed)
Seems stable Follows with Dr Juleen China

## 2013-08-02 NOTE — Assessment & Plan Note (Signed)
Stable fluid status No changes needed Lungs seem to be his major limitation

## 2013-08-02 NOTE — Addendum Note (Signed)
Addended by: Emelia Salisbury C on: 08/02/2013 01:08 PM   Modules accepted: Orders

## 2013-08-02 NOTE — Assessment & Plan Note (Signed)
Due for labs

## 2013-08-02 NOTE — Progress Notes (Signed)
Subjective:    Patient ID: Miguel Huber, male    DOB: January 03, 1928, 80 y.o.   MRN: [id number]  HPI Here for Medicare wellness visit and follow up Lives with son Linus Mako with walker Still drives and will do shopping (uses power cart) Does some light cooking---like breakfast. Son does house cleaning, etc.  Sees eye doctor at [facility] eye. Sees Dr Delton Prairie, Dr Juleen China for nephrology No regular exercise--limited by breathing No falls No depression or anhedonia Some memory problems--forgets names, etc Hearing is okay, mild vision loss  Checks sugars bid Uses regular insulin bid unless sugar under 100. Mostly 1015 units Sugars higher in the AM-- eats sugar free cookies at night No pain in feet. No sores Does get itching around ankles Seen by Dr Cleda Mccreedy for nail care--had decreased sensation  Still with back pain--especially with bending Thinks it is mostly muscular Uses the tramadol as needed  Breathing is stable No recent illness Stable DOE No recent cough  Current Outpatient Prescriptions on File Prior to Visit  Medication Sig Dispense Refill  . Fluticasone-Salmeterol (ADVAIR DISKUS) 250-50 MCG/DOSE AEPB Inhale 1 puff into the lungs 2 (two) times daily.        . furosemide (LASIX) 80 MG tablet TAKE 1 TABLET BY MOUTH EVERY DAY  30 tablet  11  . insulin NPH Human (NOVOLIN N) 100 UNIT/ML injection Inject 25 Units into the skin 2 (two) times daily before a meal. Dx: 250.40  10 mL  11  . Insulin Syringe-Needle U-100 (B-D INS SYRINGE 0.5CC/30GX1/2") 30G X 1/2" 0.5 ML MISC Use as directed to inject insulin dx: 250.00  100 each  3  . ipratropium-albuterol (DUONEB) 0.5-2.5 (3) MG/3ML SOLN Take 3 mLs by nebulization 4 (four) times daily. Dx: 491.21  360 mL  11  . ketoconazole (NIZORAL) 2 % cream APPLY TOPICALLY TO GROIN RASH TWICE A DAY UNTIL CLEAR  60 g  1  . lisinopril (PRINIVIL,ZESTRIL) 10 MG tablet TAKE 1 TABLET BY MOUTH EVERY DAY  90 tablet  3  . omeprazole (PRILOSEC) 20 MG  capsule TAKE ONE CAPSULE BY MOUTH EVERY DAY FOR ACID REFLUX  30 capsule  11  . polyethylene glycol (MIRALAX / GLYCOLAX) packet Take 17 g by mouth daily.      . simvastatin (ZOCOR) 20 MG tablet TAKE 1 TABLET BY MOUTH EVERY DAY  30 tablet  11  . temazepam (RESTORIL) 30 MG capsule TAKE ONE CAPSULE BY MOUTH AT BEDTIME  30 capsule  0  . traMADol (ULTRAM) 50 MG tablet Take 1-2 tablets (50-100 mg total) by mouth at bedtime as needed for pain.  60 tablet  1  . allopurinol (ZYLOPRIM) 100 MG tablet Take 100 mg by mouth daily.        No current facility-administered medications on file prior to visit.    Allergies  Allergen Reactions  . Theophyllines     Past Medical History  Diagnosis Date  . Allergic rhinitis   . Hx of adenomatous colonic polyps   . Pulmonary fibrosis   . COPD (chronic obstructive pulmonary disease)   . GERD (gastroesophageal reflux disease)   . Erectile dysfunction   . Cardiomyopathy   . Hyperlipidemia   . Renal insufficiency   . Osteoarthritis   . Diabetes mellitus   . Hypertension   . DVT (deep venous thrombosis), right 5/13    Past Surgical History  Procedure Laterality Date  . Orif clavicle fracture      Left clavicle fracture- child  .  Orif ulnar fracture      Left readius/ ulna fracture child  . Lung biopsy      Right lung bx. negative 2001  . Cataract extraction      Cataracts/ IOL OU, Epes 2005, [place] ?  2000  . Cardiovascular stress test      Myoview stress negative EF 65-70% 08/04    Family History  Problem Relation Age of Onset  . Cancer Neg Hx     History   Social History  . Marital Status: Married    Spouse Name: N/A    Number of Children: 3  . Years of Education: N/A   Occupational History  . retired     IT consultant   Social History Main Topics  . Smoking status: Former Research scientist (life sciences)  . Smokeless tobacco: Never Used  . Alcohol Use: No  . Drug Use: No  . Sexual Activity: Not on file   Other Topics Concern  . Not on file    Social History Narrative   Has platonic lady lfriend since ~2008  [facility]. Currently in nursing home after stroke.      Has living will now   No health care POA--has 3 children   Would like to try resuscitation   Might accept tube feeds if cognitively unaware   Review of Systems Sleeps best in AM--like 3-11AM. Bowels have been okay on the miralax Voids okay. Nocturia x 1 usually Still has the itching ankles---cream does help (he doesn't remember which one--he will request at the pharmacy)     Objective:   Physical Exam  Constitutional: He is oriented to person, place, and time. He appears well-developed and well-nourished. No distress.  HENT:  Mouth/Throat: Oropharynx is clear and moist. No oropharyngeal exudate.  Edentulous No oral lesions  Neck: Normal range of motion. Neck supple. No thyromegaly present.  Cardiovascular: Normal rate, regular rhythm and normal heart sounds.  Exam reveals no gallop.   No murmur heard. Very faint pedal pulses  Pulmonary/Chest: Effort normal. No respiratory distress. He has no wheezes. He has no rales.  Decreased breath sounds but clear  Abdominal: Soft. There is no tenderness.  Musculoskeletal: He exhibits no edema and no tenderness.  Mild venous changes in feet  Lymphadenopathy:    He has no cervical adenopathy.  Neurological: He is alert and oriented to person, place, and time.  President-- "Obama, wife is a Holiday representative....then Clinton" 100-97-?? [id number] Recall 2/3  Decreased sensation in feet  Skin:  No foot lesions  Psychiatric: He has a normal mood and affect. His behavior is normal.          Assessment & Plan:

## 2013-08-02 NOTE — Patient Instructions (Signed)

## 2013-08-02 NOTE — Addendum Note (Signed)
Addended by: Viviana Simpler I on: 08/02/2013 11:11 AM   Modules accepted: Orders, Medications

## 2013-08-02 NOTE — Assessment & Plan Note (Signed)
Last LDL 165 on the low dose simvastatin  Will change to atorvastatin

## 2013-08-02 NOTE — Assessment & Plan Note (Signed)
Stable Sees Dr Humphrey Rolls

## 2013-08-02 NOTE — Assessment & Plan Note (Signed)
Mild sensory changes No Rx needed

## 2013-08-02 NOTE — Assessment & Plan Note (Signed)
Hopefully still reasonable control Will check A1c

## 2013-08-02 NOTE — Assessment & Plan Note (Signed)
BP Readings from Last 3 Encounters:  08/02/13 120/68  01/31/13 110/68  10/14/12 102/62   Good control

## 2013-08-03 ENCOUNTER — Telehealth: Payer: Self-pay | Admitting: Internal Medicine

## 2013-08-03 NOTE — Telephone Encounter (Signed)
Relevant patient education mailed to patient.  

## 2013-08-04 ENCOUNTER — Encounter: Payer: Self-pay | Admitting: Family Medicine

## 2013-08-09 DIAGNOSIS — J438 Other emphysema: Secondary | ICD-10-CM | POA: Diagnosis not present

## 2013-08-09 DIAGNOSIS — J449 Chronic obstructive pulmonary disease, unspecified: Secondary | ICD-10-CM | POA: Diagnosis not present

## 2013-08-23 ENCOUNTER — Encounter: Payer: Self-pay | Admitting: Internal Medicine

## 2013-08-26 ENCOUNTER — Other Ambulatory Visit: Payer: Self-pay | Admitting: Internal Medicine

## 2013-08-29 NOTE — Telephone Encounter (Signed)
Okay #30 x 0 

## 2013-08-29 NOTE — Telephone Encounter (Signed)
rx called into pharmacy

## 2013-08-29 NOTE — Telephone Encounter (Signed)
07/25/13 

## 2013-09-01 IMAGING — US ABDOMEN ULTRASOUND
1 series · 14 of 25 positions shown · non-contrast
Comparison: none

REASON FOR EXAM: cholestasis
COMMENTS:

PROCEDURE:     US  - US ABDOMEN GENERAL SURVEY  - October 06, 2012  [DATE]
RESULT:

[Series 1: abdomen ultrasound · 0.31mm/px · 14 of 72 slices shown]
[im 1/72]
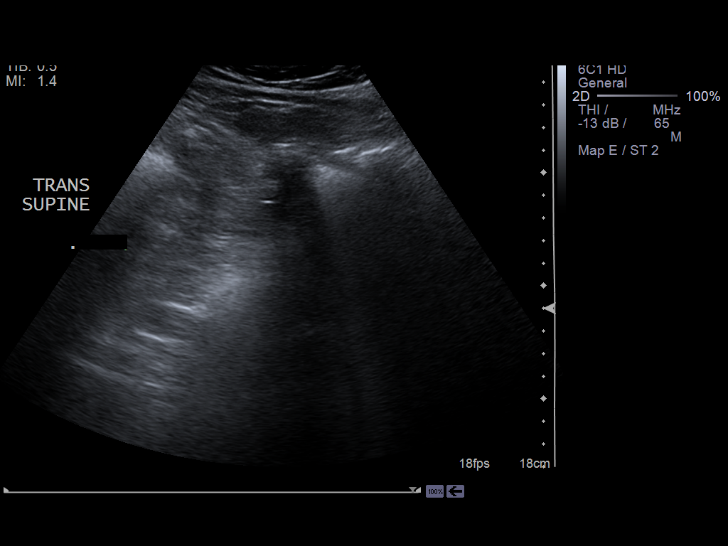
[im 6/72]
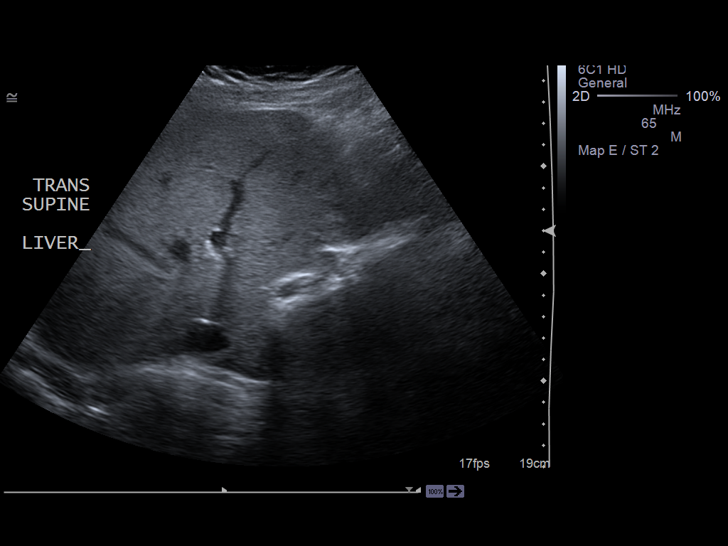
[im 12/72]
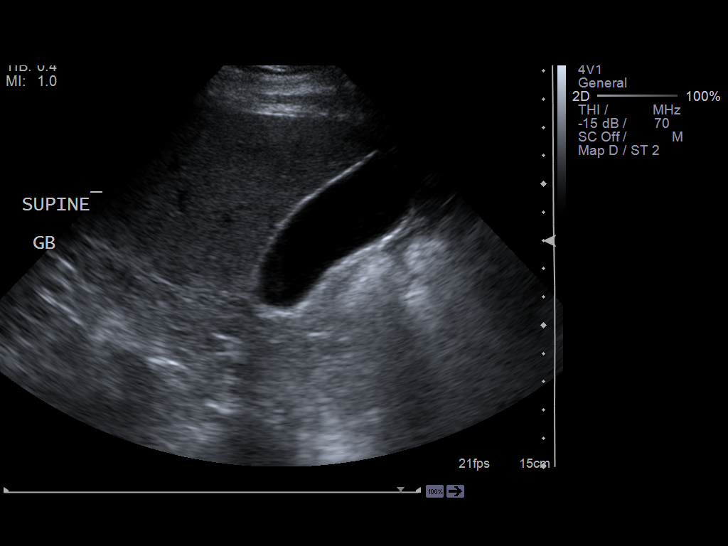
[im 18/72]
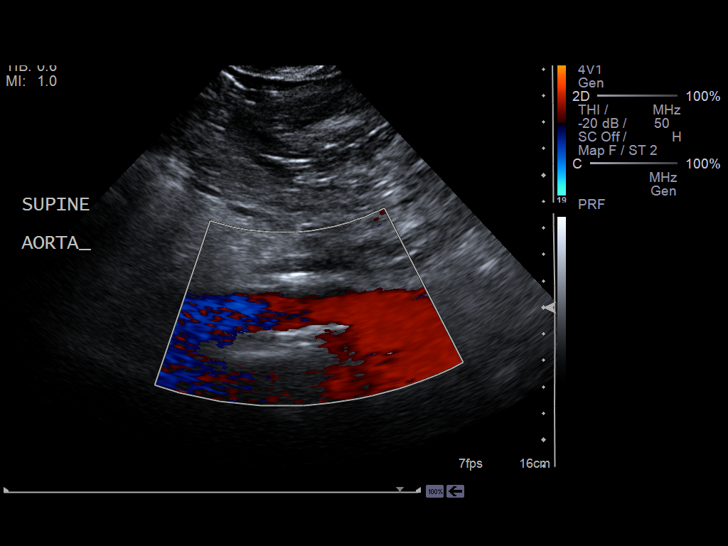
[im 24/72]
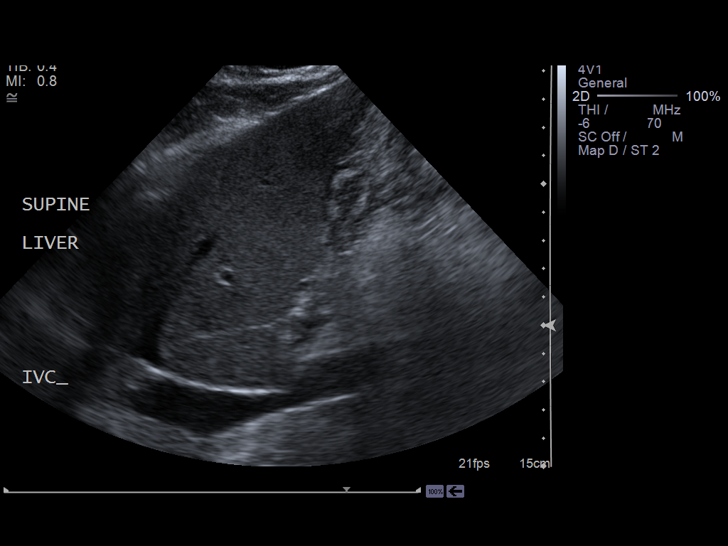
[im 27/72]
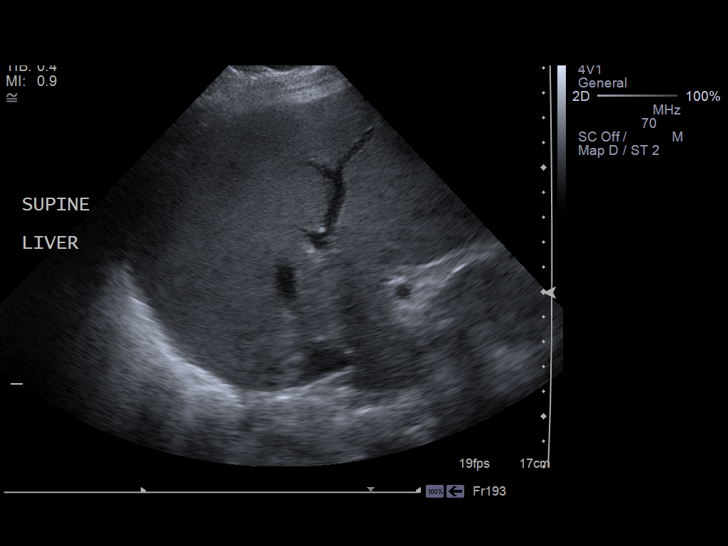
[im 33/72]
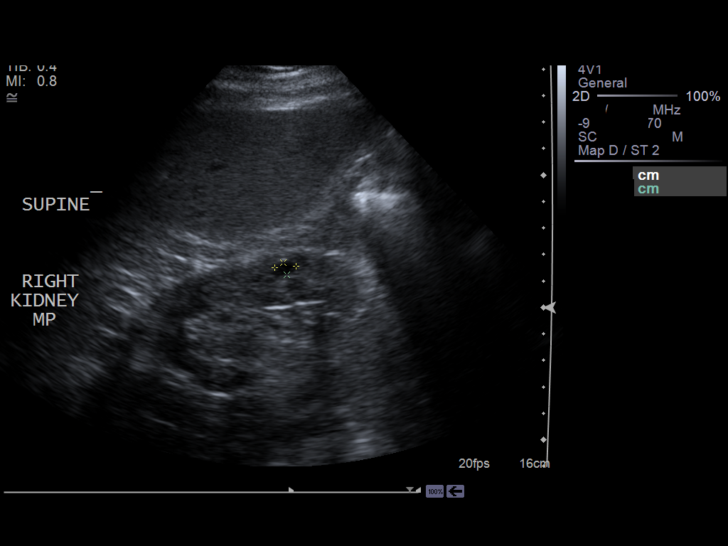
[im 39/72]
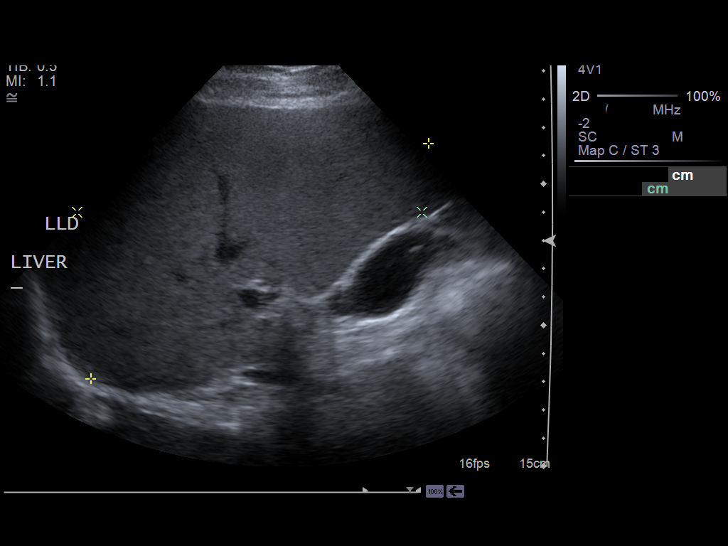
[im 45/72]
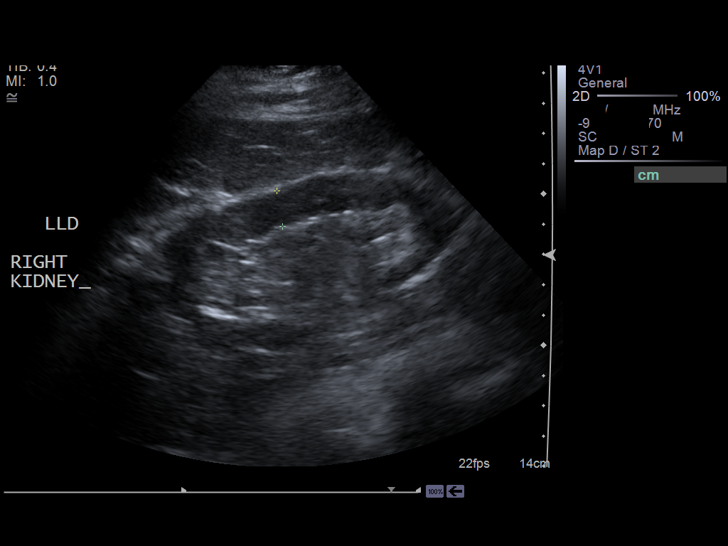
[im 48/72]
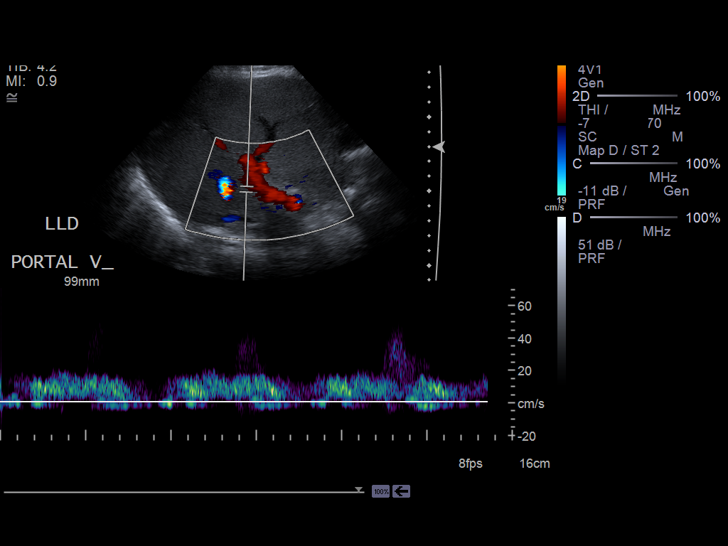
[im 54/72]
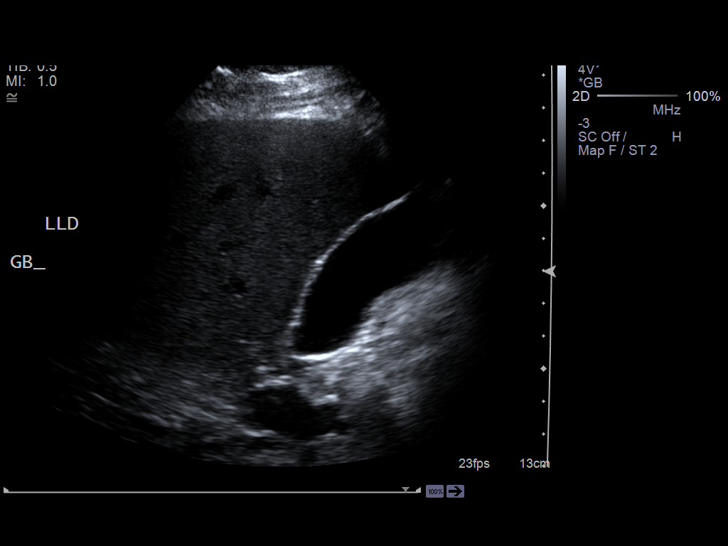
[im 60/72]
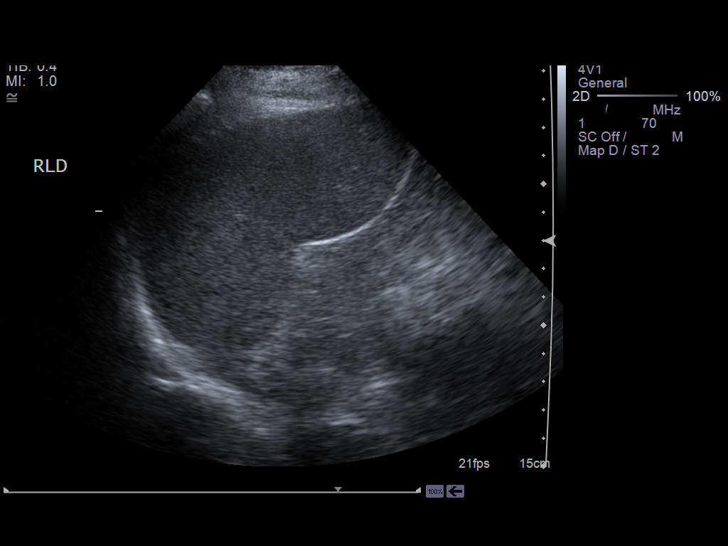
[im 66/72]
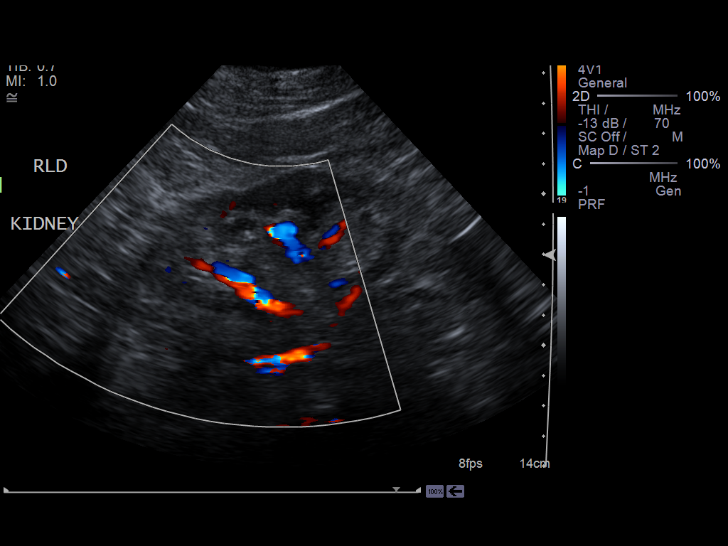
[im 72/72]
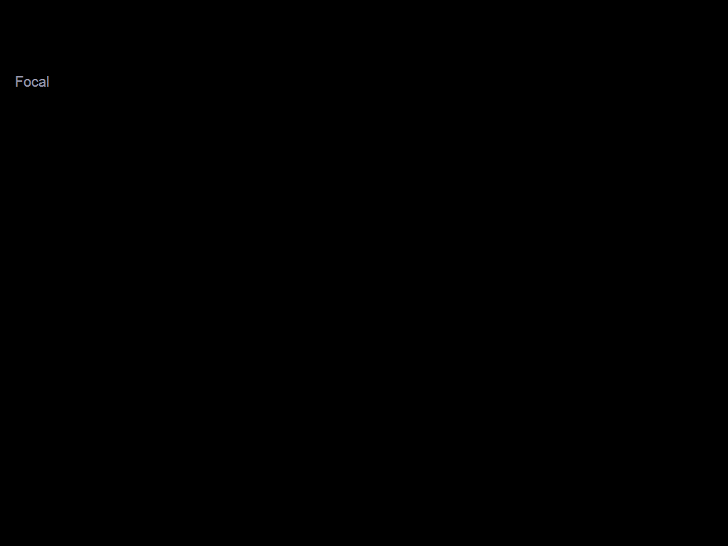

[14 of 25 positions shown; findings below may reference images not displayed]

FINDINGS: The liver demonstrates a homogeneous echotexture. Hepatopetal flow
is identified within the portal vein. The visualized portions of the aorta
are unremarkable. The IVC is unremarkable. The pancreas is not visualized.

There is no evidence of pericholecystic fluid, gallstones, nor sludging.
There is no evidence of a sonographic Murphy's sign, gallbladder wall
thickening, nor intrahepatic or extrahepatic biliary ductal dilatation. The
gallbladder wall thickness is 2.1 mm and the common bile duct measures
mm in diameter.

The right kidney measures 9.36 x 4.90 x 5.94 cm and the left 11.05 x 4.89 x
5.31 cm. A 0.81 x 0.48 x 0.68 cm cyst is identified within the midpole
region of the right kidney. The kidneys otherwise demonstrate appropriate
corticomedullary differentiation without further solid or cystic masses,
calculi, nor hydronephrosis.
IMPRESSION: Incidental finding of a small cyst within the right kidney,
otherwise unremarkable abdominal ultrasound.

## 2013-09-26 ENCOUNTER — Other Ambulatory Visit: Payer: Self-pay | Admitting: Internal Medicine

## 2013-09-27 ENCOUNTER — Other Ambulatory Visit: Payer: Self-pay | Admitting: Internal Medicine

## 2013-09-27 NOTE — Telephone Encounter (Signed)
08/29/13 

## 2013-09-27 NOTE — Telephone Encounter (Signed)
Okay #30 x 0 

## 2013-09-27 NOTE — Telephone Encounter (Signed)
rx called into pharmacy

## 2013-10-03 ENCOUNTER — Telehealth: Payer: Self-pay

## 2013-10-03 NOTE — Telephone Encounter (Signed)
Elita Quick with [facility] request status of back brace request; advised Elita Quick that [place] guidelines are for pt to bring requested form for medical equipment to a scheduled office visit with PCP. Elita Quick voiced understanding and Elita Quick will notify pt.

## 2013-10-10 DIAGNOSIS — E1142 Type 2 diabetes mellitus with diabetic polyneuropathy: Secondary | ICD-10-CM | POA: Diagnosis not present

## 2013-10-10 DIAGNOSIS — B351 Tinea unguium: Secondary | ICD-10-CM | POA: Diagnosis not present

## 2013-10-10 DIAGNOSIS — L851 Acquired keratosis [keratoderma] palmaris et plantaris: Secondary | ICD-10-CM | POA: Diagnosis not present

## 2013-10-10 DIAGNOSIS — E1149 Type 2 diabetes mellitus with other diabetic neurological complication: Secondary | ICD-10-CM | POA: Diagnosis not present

## 2013-10-22 ENCOUNTER — Other Ambulatory Visit: Payer: Self-pay | Admitting: Internal Medicine

## 2013-10-24 NOTE — Telephone Encounter (Signed)
09/27/13 

## 2013-10-24 NOTE — Telephone Encounter (Signed)
Okay #30 x 0 

## 2013-10-24 NOTE — Telephone Encounter (Signed)
rx called into pharmacy

## 2013-10-27 ENCOUNTER — Other Ambulatory Visit: Payer: Self-pay | Admitting: Internal Medicine

## 2013-11-01 ENCOUNTER — Encounter: Payer: Self-pay | Admitting: Internal Medicine

## 2013-11-01 ENCOUNTER — Ambulatory Visit (INDEPENDENT_AMBULATORY_CARE_PROVIDER_SITE_OTHER): Payer: Medicare Other | Admitting: Internal Medicine

## 2013-11-01 VITALS — BP 126/70 | HR 98 | Temp 98.6°F | Wt 188.0 lb

## 2013-11-01 DIAGNOSIS — J441 Chronic obstructive pulmonary disease with (acute) exacerbation: Secondary | ICD-10-CM

## 2013-11-01 DIAGNOSIS — R059 Cough, unspecified: Secondary | ICD-10-CM

## 2013-11-01 DIAGNOSIS — R05 Cough: Secondary | ICD-10-CM

## 2013-11-01 MED ORDER — AZITHROMYCIN 250 MG PO TABS: ORAL_TABLET | ORAL | Status: DC

## 2013-11-01 MED ORDER — HYDROCODONE-HOMATROPINE 5-1.5 MG/5ML PO SYRP: 5.0000 mL | ORAL_SOLUTION | Freq: Three times a day (TID) | ORAL | Status: DC | PRN

## 2013-11-01 NOTE — Progress Notes (Signed)
HPI  Pt presents to the clinic today with c/o cough. He reports this started 4 days ago. The cough is non productive. He reports he is unable to sleep at night due to the cough. He denies associated s/s.  He has tried OTC cough syrup. He does have a history of COPD and is on oxygen. He is on Advair, Duoneb and Lasix.  Review of Systems      Past Medical History  Diagnosis Date  . Allergic rhinitis   . Hx of adenomatous colonic polyps   . Pulmonary fibrosis   . COPD (chronic obstructive pulmonary disease)   . GERD (gastroesophageal reflux disease)   . Erectile dysfunction   . Cardiomyopathy   . Hyperlipidemia   . Renal insufficiency   . Osteoarthritis   . Diabetes mellitus   . Hypertension   . DVT (deep venous thrombosis), right 5/13    Family History  Problem Relation Age of Onset  . Cancer Neg Hx     History   Social History  . Marital Status: Married    Spouse Name: N/A    Number of Children: 3  . Years of Education: N/A   Occupational History  . retired     IT consultant   Social History Main Topics  . Smoking status: Former Research scientist (life sciences)  . Smokeless tobacco: Never Used  . Alcohol Use: No  . Drug Use: No  . Sexual Activity: Not on file   Other Topics Concern  . Not on file   Social History Narrative   Has platonic lady lfriend since ~2008  [facility]. Currently in nursing home after stroke.      Has living will now   No health care POA--has 3 children   Would like to try resuscitation   Might accept tube feeds if cognitively unaware    Allergies  Allergen Reactions  . Theophyllines      Constitutional: Positive fatigue. Denies headache, fever or abrupt weight changes.  HEENT:  Denies eye redness, eye pain, pressure behind the eyes, facial pain, nasal congestion, ear pain, ringing in the ears, wax buildup, runny nose or bloody nose. Respiratory: Positive cough and shortness of breath. Denies difficulty breathing.  Cardiovascular: Denies chest pain,  chest tightness, palpitations or swelling in the hands or feet.   No other specific complaints in a complete review of systems (except as listed in HPI above).  Objective:   BP 126/70  Pulse 98  Temp(Src) 98.6 F (37 C) (Oral)  Wt 188 lb (85.276 kg)  SpO2 93% Wt Readings from Last 3 Encounters:  11/01/13 188 lb (85.276 kg)  08/02/13 198 lb 8 oz (90.039 kg)  01/31/13 197 lb (89.359 kg)     General: Appears his stated age, chronically ill appearing in NAD. HEENT: Head: normal shape and size; Eyes: sclera white, no icterus, conjunctiva pink, PERRLA and EOMs intact; Ears: Tm's gray and intact, normal light reflex; Nose: mucosa pink and moist, septum midline; Throat/Mouth: + PND. Teeth present, mucosa erythematous and moist, no exudate noted, no lesions or ulcerations noted.  Neck: Neck supple, trachea midline. No massses, lumps or thyromegaly present.  Cardiovascular: Normal rate and rhythm. Distant S1,S2 noted.  No murmur, rubs or gallops noted. Pulmonary/Chest: Increased effort and diminished  vesicular breath sounds. Fine crackles noted. No wheezes or ronchi noted.      Assessment & Plan:   Cough, secondary to COPD exacerbation:  Get some rest and drink plenty of water eRx for Azithromax x 5  days eRx for Hycodan cough syrup Continue oxygen and inhalers as prescribed  RTC as needed or if symptoms persist.

## 2013-11-01 NOTE — Progress Notes (Signed)
Pre visit review using our clinic review tool, if applicable. No additional management support is needed unless otherwise documented below in the visit note. 

## 2013-11-01 NOTE — Patient Instructions (Addendum)

## 2013-11-02 ENCOUNTER — Ambulatory Visit: Payer: Medicare Other | Admitting: Internal Medicine

## 2013-11-21 DIAGNOSIS — N183 Chronic kidney disease, stage 3 unspecified: Secondary | ICD-10-CM | POA: Diagnosis not present

## 2013-11-21 DIAGNOSIS — R809 Proteinuria, unspecified: Secondary | ICD-10-CM | POA: Diagnosis not present

## 2013-11-21 DIAGNOSIS — D631 Anemia in chronic kidney disease: Secondary | ICD-10-CM | POA: Diagnosis not present

## 2013-11-21 DIAGNOSIS — M109 Gout, unspecified: Secondary | ICD-10-CM | POA: Diagnosis not present

## 2013-11-21 DIAGNOSIS — E119 Type 2 diabetes mellitus without complications: Secondary | ICD-10-CM | POA: Diagnosis not present

## 2013-11-21 DIAGNOSIS — I10 Essential (primary) hypertension: Secondary | ICD-10-CM | POA: Diagnosis not present

## 2013-11-21 DIAGNOSIS — N2581 Secondary hyperparathyroidism of renal origin: Secondary | ICD-10-CM | POA: Diagnosis not present

## 2013-11-28 ENCOUNTER — Other Ambulatory Visit: Payer: Self-pay | Admitting: Internal Medicine

## 2013-11-28 NOTE — Telephone Encounter (Signed)
Okay #30 X 0

## 2013-11-28 NOTE — Telephone Encounter (Signed)
rx called into pharmacy

## 2013-11-28 NOTE — Telephone Encounter (Signed)
10/24/13 

## 2013-12-13 DIAGNOSIS — J438 Other emphysema: Secondary | ICD-10-CM | POA: Diagnosis not present

## 2013-12-13 DIAGNOSIS — J449 Chronic obstructive pulmonary disease, unspecified: Secondary | ICD-10-CM | POA: Diagnosis not present

## 2013-12-14 DIAGNOSIS — L280 Lichen simplex chronicus: Secondary | ICD-10-CM | POA: Diagnosis not present

## 2013-12-14 DIAGNOSIS — C4441 Basal cell carcinoma of skin of scalp and neck: Secondary | ICD-10-CM | POA: Diagnosis not present

## 2013-12-14 DIAGNOSIS — L570 Actinic keratosis: Secondary | ICD-10-CM | POA: Diagnosis not present

## 2013-12-14 DIAGNOSIS — D485 Neoplasm of uncertain behavior of skin: Secondary | ICD-10-CM | POA: Diagnosis not present

## 2013-12-19 ENCOUNTER — Other Ambulatory Visit: Payer: Self-pay | Admitting: Internal Medicine

## 2013-12-29 ENCOUNTER — Other Ambulatory Visit: Payer: Self-pay | Admitting: Internal Medicine

## 2013-12-30 NOTE — Telephone Encounter (Signed)
11/28/13 

## 2013-12-30 NOTE — Telephone Encounter (Signed)
Okay #30 x 0 

## 2013-12-30 NOTE — Telephone Encounter (Signed)
rx called into pharmacy

## 2014-01-09 DIAGNOSIS — E1149 Type 2 diabetes mellitus with other diabetic neurological complication: Secondary | ICD-10-CM | POA: Diagnosis not present

## 2014-01-09 DIAGNOSIS — E1142 Type 2 diabetes mellitus with diabetic polyneuropathy: Secondary | ICD-10-CM | POA: Diagnosis not present

## 2014-01-09 DIAGNOSIS — B351 Tinea unguium: Secondary | ICD-10-CM | POA: Diagnosis not present

## 2014-01-09 DIAGNOSIS — L851 Acquired keratosis [keratoderma] palmaris et plantaris: Secondary | ICD-10-CM | POA: Diagnosis not present

## 2014-01-16 ENCOUNTER — Other Ambulatory Visit: Payer: Self-pay | Admitting: *Deleted

## 2014-01-16 MED ORDER — OMEPRAZOLE 20 MG PO CPDR: 20.0000 mg | DELAYED_RELEASE_CAPSULE | Freq: Every day | ORAL | Status: DC

## 2014-01-16 MED ORDER — FUROSEMIDE 80 MG PO TABS: 80.0000 mg | ORAL_TABLET | Freq: Every day | ORAL | Status: DC

## 2014-01-24 DIAGNOSIS — H40009 Preglaucoma, unspecified, unspecified eye: Secondary | ICD-10-CM | POA: Diagnosis not present

## 2014-01-25 ENCOUNTER — Other Ambulatory Visit: Payer: Self-pay | Admitting: Internal Medicine

## 2014-01-26 NOTE — Telephone Encounter (Signed)
rx called into pharmacy

## 2014-01-26 NOTE — Telephone Encounter (Signed)
12/30/13 

## 2014-01-26 NOTE — Telephone Encounter (Signed)
Okay #30 x 0 

## 2014-01-27 ENCOUNTER — Telehealth: Payer: Self-pay | Admitting: Internal Medicine

## 2014-01-27 NOTE — Telephone Encounter (Signed)
Pt dropped off emergency [employer] form to be completed and signed.  Dee-please call pt directly if you can mail forms directly for him. His portion is already filled out,  Forms on [employer].

## 2014-01-31 DIAGNOSIS — H40009 Preglaucoma, unspecified, unspecified eye: Secondary | ICD-10-CM | POA: Diagnosis not present

## 2014-01-31 NOTE — Telephone Encounter (Signed)
Form on your desk  

## 2014-01-31 NOTE — Telephone Encounter (Signed)
I left a message at patient's home to let him know I was faxing forms to [employer].

## 2014-01-31 NOTE — Telephone Encounter (Signed)
Form done No charge 

## 2014-02-01 LAB — HM DIABETES EYE EXAM

## 2014-02-03 ENCOUNTER — Ambulatory Visit (INDEPENDENT_AMBULATORY_CARE_PROVIDER_SITE_OTHER): Payer: Medicare Other | Admitting: Internal Medicine

## 2014-02-03 ENCOUNTER — Encounter: Payer: Self-pay | Admitting: Internal Medicine

## 2014-02-03 VITALS — BP 110/60 | HR 73 | Temp 98.6°F | Wt 197.0 lb

## 2014-02-03 DIAGNOSIS — Z23 Encounter for immunization: Secondary | ICD-10-CM

## 2014-02-03 DIAGNOSIS — N183 Chronic kidney disease, stage 3 unspecified: Secondary | ICD-10-CM | POA: Diagnosis not present

## 2014-02-03 DIAGNOSIS — E1129 Type 2 diabetes mellitus with other diabetic kidney complication: Secondary | ICD-10-CM | POA: Diagnosis not present

## 2014-02-03 DIAGNOSIS — E1142 Type 2 diabetes mellitus with diabetic polyneuropathy: Secondary | ICD-10-CM

## 2014-02-03 DIAGNOSIS — E1149 Type 2 diabetes mellitus with other diabetic neurological complication: Secondary | ICD-10-CM

## 2014-02-03 DIAGNOSIS — J449 Chronic obstructive pulmonary disease, unspecified: Secondary | ICD-10-CM

## 2014-02-03 DIAGNOSIS — I428 Other cardiomyopathies: Secondary | ICD-10-CM

## 2014-02-03 LAB — RENAL FUNCTION PANEL
ALBUMIN: 3.7 g/dL (ref 3.5–5.2)
BUN: 33 mg/dL — AB (ref 6–23)
CALCIUM: 9.2 mg/dL (ref 8.4–10.5)
CO2: 31 mEq/L (ref 19–32)
CREATININE: 1.5 mg/dL (ref 0.4–1.5)
Chloride: 106 mEq/L (ref 96–112)
GFR: 46.69 mL/min — ABNORMAL LOW (ref >60.00)
GLUCOSE: 103 mg/dL — AB (ref 70–99)
Phosphorus: 2.1 mg/dL — ABNORMAL LOW (ref 2.3–4.6)
Potassium: 4.1 mEq/L (ref 3.5–5.1)
Sodium: 142 mEq/L (ref 135–145)

## 2014-02-03 LAB — HM DIABETES FOOT EXAM

## 2014-02-03 LAB — HEMOGLOBIN A1C: Hgb A1c MFr Bld: 7.9 % — ABNORMAL HIGH (ref 4.6–6.5)

## 2014-02-03 NOTE — Progress Notes (Signed)
Subjective:    Patient ID: Miguel Huber, male    DOB: Jan 23, 1928, 80 y.o.   MRN: [id number]  HPI Doing "fair"  Having some LLQ soreness for a couple of months May have some stool buildup-- better since back to regular miralax Appetite is good  Checks sugars bid Holds insulin if under 100 25N/10R if under 200, 25N/15 if over 200 Discussed that he should only hold the R if low No recent hypoglycemic reactions  No recent COPD problems No regular cough No fever or wheezing Does some instrumental ADLs but help with heavy stuff  Significant other Stanton Kidney still in skilled care They go to church and out to eat regularly  No edema problems Occasionally forgets fluid pill Sleeps fairly flat--no PND Nocturia x 1 No dizziness or syncope  Takes prilosec regularly No heartburn Still gets things caught in throat at times--still may cough after thin liquids (like lemonade but not milk or water)  Current Outpatient Prescriptions on File Prior to Visit  Medication Sig Dispense Refill  . allopurinol (ZYLOPRIM) 100 MG tablet Take 100 mg by mouth daily.       . furosemide (LASIX) 80 MG tablet Take 1 tablet (80 mg total) by mouth daily.  90 tablet  3  . HYDROcodone-homatropine (HYCODAN) 5-1.5 MG/5ML syrup Take 5 mLs by mouth every 8 (eight) hours as needed for cough.  120 mL  0  . insulin NPH Human (NOVOLIN N) 100 UNIT/ML injection Inject 25 Units into the skin 2 (two) times daily before a meal. Dx: 250.40  10 mL  11  . Insulin Syringe-Needle U-100 (B-D INS SYRINGE 0.5CC/30GX1/2") 30G X 1/2" 0.5 ML MISC Use as directed to inject insulin dx: 250.00  100 each  3  . ipratropium-albuterol (DUONEB) 0.5-2.5 (3) MG/3ML SOLN USE 1 VIAL IN NEBULIZER 4 TIMES A DAY  360 mL  3  . ketoconazole (NIZORAL) 2 % cream APPLY TOPICALLY TO GROIN RASH TWICE A DAY UNTIL CLEAR  60 g  1  . lisinopril (PRINIVIL,ZESTRIL) 10 MG tablet TAKE 1 TABLET BY MOUTH EVERY DAY  90 tablet  3  . NOVOLIN R 100 UNIT/ML injection  INJECT 10 UNITS TWO TIMES A DAY  10 mL  5  . omeprazole (PRILOSEC) 20 MG capsule Take 1 capsule (20 mg total) by mouth daily.  90 capsule  3  . polyethylene glycol (MIRALAX / GLYCOLAX) packet Take 17 g by mouth daily.      . rosuvastatin (CRESTOR) 20 MG tablet Take 1 tablet (20 mg total) by mouth daily.  90 tablet  3  . temazepam (RESTORIL) 30 MG capsule TAKE ONE CAPSULE BY MOUTH AT BEDTIME  30 capsule  0   No current facility-administered medications on file prior to visit.    Allergies  Allergen Reactions  . Theophyllines     Past Medical History  Diagnosis Date  . Allergic rhinitis   . Hx of adenomatous colonic polyps   . Pulmonary fibrosis   . COPD (chronic obstructive pulmonary disease)   . GERD (gastroesophageal reflux disease)   . Erectile dysfunction   . Cardiomyopathy   . Hyperlipidemia   . Renal insufficiency   . Osteoarthritis   . Diabetes mellitus   . Hypertension   . DVT (deep venous thrombosis), right 5/13    Past Surgical History  Procedure Laterality Date  . Orif clavicle fracture      Left clavicle fracture- child  . Orif ulnar fracture      Left  readius/ ulna fracture child  . Lung biopsy      Right lung bx. negative 2001  . Cataract extraction      Cataracts/ IOL OU, Epes 2005, [place] ?  2000  . Cardiovascular stress test      Myoview stress negative EF 65-70% 08/04    Family History  Problem Relation Age of Onset  . Cancer Neg Hx     History   Social History  . Marital Status: Married    Spouse Name: N/A    Number of Children: 3  . Years of Education: N/A   Occupational History  . retired     IT consultant   Social History Main Topics  . Smoking status: Former Research scientist (life sciences)  . Smokeless tobacco: Never Used  . Alcohol Use: No  . Drug Use: No  . Sexual Activity: Not on file   Other Topics Concern  . Not on file   Social History Narrative   Has platonic lady lfriend since ~2008  [facility]. Currently in nursing home after stroke.       Has living will now   No health care POA--has 3 children   Would like to try resuscitation   Might accept tube feeds if cognitively unaware   Review of Systems Skin cancer just removed from behind right ear--needs larger excision soon. Weight stable Trouble initiating sleep--but then does well. Uses the temazepam regularly.    Objective:   Physical Exam  Constitutional: He appears well-developed and well-nourished. No distress.  Neck: Normal range of motion. Neck supple. No thyromegaly present.  Cardiovascular: Normal rate, regular rhythm and normal heart sounds.  Exam reveals no gallop.   No murmur heard. Faint or absent pedal pulses  Pulmonary/Chest: Effort normal. No respiratory distress. He has no wheezes.  Slightly decreased breath sounds with very fine dry crackles at bases  Musculoskeletal:  1+ pitting edema in ankles  Lymphadenopathy:    He has no cervical adenopathy.  Skin:  No foot lesions  Psychiatric: He has a normal mood and affect. His behavior is normal.          Assessment & Plan:

## 2014-02-03 NOTE — Assessment & Plan Note (Signed)
No exacerbation No changes needed

## 2014-02-03 NOTE — Assessment & Plan Note (Signed)
Will recheck labs Is on lisinopril

## 2014-02-03 NOTE — Patient Instructions (Signed)
If your sugar is under 100, please take your N insulin but no R. Continue the other dosing for over 100.

## 2014-02-03 NOTE — Assessment & Plan Note (Signed)
Mild sensory changes but no pain No Rx needed

## 2014-02-03 NOTE — Assessment & Plan Note (Signed)
Does okay with daily furosemide Urged him to be consistent with this

## 2014-02-03 NOTE — Progress Notes (Signed)
Pre visit review using our clinic review tool, if applicable. No additional management support is needed unless otherwise documented below in the visit note. 

## 2014-02-03 NOTE — Assessment & Plan Note (Signed)
Seems to have reasonable control Discussed continuing N insulin even if under 100

## 2014-02-03 NOTE — Addendum Note (Signed)
Addended by: Despina Hidden on: 02/03/2014 03:47 PM   Modules accepted: Orders

## 2014-02-04 DIAGNOSIS — D649 Anemia, unspecified: Secondary | ICD-10-CM | POA: Diagnosis not present

## 2014-02-04 DIAGNOSIS — K5190 Ulcerative colitis, unspecified, without complications: Secondary | ICD-10-CM | POA: Diagnosis not present

## 2014-02-04 DIAGNOSIS — R5381 Other malaise: Secondary | ICD-10-CM | POA: Diagnosis not present

## 2014-02-04 DIAGNOSIS — R5383 Other fatigue: Secondary | ICD-10-CM | POA: Diagnosis not present

## 2014-02-04 DIAGNOSIS — I6350 Cerebral infarction due to unspecified occlusion or stenosis of unspecified cerebral artery: Secondary | ICD-10-CM | POA: Diagnosis not present

## 2014-02-04 DIAGNOSIS — M47812 Spondylosis without myelopathy or radiculopathy, cervical region: Secondary | ICD-10-CM | POA: Diagnosis not present

## 2014-02-04 DIAGNOSIS — R209 Unspecified disturbances of skin sensation: Secondary | ICD-10-CM | POA: Diagnosis not present

## 2014-02-05 DIAGNOSIS — K5190 Ulcerative colitis, unspecified, without complications: Secondary | ICD-10-CM | POA: Diagnosis not present

## 2014-02-05 DIAGNOSIS — I6350 Cerebral infarction due to unspecified occlusion or stenosis of unspecified cerebral artery: Secondary | ICD-10-CM | POA: Diagnosis not present

## 2014-02-05 DIAGNOSIS — I6529 Occlusion and stenosis of unspecified carotid artery: Secondary | ICD-10-CM | POA: Diagnosis not present

## 2014-02-05 DIAGNOSIS — K5289 Other specified noninfective gastroenteritis and colitis: Secondary | ICD-10-CM | POA: Diagnosis not present

## 2014-02-05 DIAGNOSIS — D649 Anemia, unspecified: Secondary | ICD-10-CM | POA: Diagnosis not present

## 2014-02-05 DIAGNOSIS — R799 Abnormal finding of blood chemistry, unspecified: Secondary | ICD-10-CM | POA: Diagnosis not present

## 2014-02-06 ENCOUNTER — Encounter: Payer: Self-pay | Admitting: *Deleted

## 2014-02-06 DIAGNOSIS — D649 Anemia, unspecified: Secondary | ICD-10-CM | POA: Diagnosis not present

## 2014-02-06 DIAGNOSIS — K5289 Other specified noninfective gastroenteritis and colitis: Secondary | ICD-10-CM | POA: Diagnosis not present

## 2014-02-06 DIAGNOSIS — K5190 Ulcerative colitis, unspecified, without complications: Secondary | ICD-10-CM | POA: Diagnosis not present

## 2014-02-06 DIAGNOSIS — R29818 Other symptoms and signs involving the nervous system: Secondary | ICD-10-CM | POA: Diagnosis not present

## 2014-02-06 DIAGNOSIS — R799 Abnormal finding of blood chemistry, unspecified: Secondary | ICD-10-CM | POA: Diagnosis not present

## 2014-02-06 DIAGNOSIS — I672 Cerebral atherosclerosis: Secondary | ICD-10-CM | POA: Diagnosis not present

## 2014-02-06 DIAGNOSIS — M4802 Spinal stenosis, cervical region: Secondary | ICD-10-CM | POA: Diagnosis not present

## 2014-02-06 DIAGNOSIS — I6350 Cerebral infarction due to unspecified occlusion or stenosis of unspecified cerebral artery: Secondary | ICD-10-CM | POA: Diagnosis not present

## 2014-02-07 DIAGNOSIS — D649 Anemia, unspecified: Secondary | ICD-10-CM | POA: Diagnosis not present

## 2014-02-07 DIAGNOSIS — K5190 Ulcerative colitis, unspecified, without complications: Secondary | ICD-10-CM | POA: Diagnosis not present

## 2014-02-07 DIAGNOSIS — R799 Abnormal finding of blood chemistry, unspecified: Secondary | ICD-10-CM | POA: Diagnosis not present

## 2014-02-07 DIAGNOSIS — K5289 Other specified noninfective gastroenteritis and colitis: Secondary | ICD-10-CM | POA: Diagnosis not present

## 2014-02-07 DIAGNOSIS — D509 Iron deficiency anemia, unspecified: Secondary | ICD-10-CM | POA: Diagnosis not present

## 2014-02-07 DIAGNOSIS — I5022 Chronic systolic (congestive) heart failure: Secondary | ICD-10-CM | POA: Diagnosis not present

## 2014-02-07 DIAGNOSIS — I6350 Cerebral infarction due to unspecified occlusion or stenosis of unspecified cerebral artery: Secondary | ICD-10-CM | POA: Diagnosis not present

## 2014-02-13 DIAGNOSIS — N2581 Secondary hyperparathyroidism of renal origin: Secondary | ICD-10-CM | POA: Diagnosis not present

## 2014-02-13 DIAGNOSIS — E1129 Type 2 diabetes mellitus with other diabetic kidney complication: Secondary | ICD-10-CM | POA: Diagnosis not present

## 2014-02-13 DIAGNOSIS — N183 Chronic kidney disease, stage 3 unspecified: Secondary | ICD-10-CM | POA: Diagnosis not present

## 2014-02-13 DIAGNOSIS — I129 Hypertensive chronic kidney disease with stage 1 through stage 4 chronic kidney disease, or unspecified chronic kidney disease: Secondary | ICD-10-CM | POA: Diagnosis not present

## 2014-02-22 DIAGNOSIS — C4441 Basal cell carcinoma of skin of scalp and neck: Secondary | ICD-10-CM | POA: Diagnosis not present

## 2014-02-27 ENCOUNTER — Other Ambulatory Visit: Payer: Self-pay | Admitting: Internal Medicine

## 2014-02-27 NOTE — Telephone Encounter (Signed)
Electronic Rx request for Restoril. Medication last filled 01/26/14 and last office visit was 02/03/14. Please advise.

## 2014-02-27 NOTE — Telephone Encounter (Signed)
Okay #30 x 0 

## 2014-02-28 NOTE — Telephone Encounter (Signed)
[facility] 

## 2014-03-02 ENCOUNTER — Encounter: Payer: Self-pay | Admitting: Internal Medicine

## 2014-03-02 ENCOUNTER — Ambulatory Visit (INDEPENDENT_AMBULATORY_CARE_PROVIDER_SITE_OTHER): Payer: Medicare Other | Admitting: Internal Medicine

## 2014-03-02 VITALS — BP 120/60 | HR 72 | Temp 98.2°F | Wt 192.0 lb

## 2014-03-02 DIAGNOSIS — R05 Cough: Secondary | ICD-10-CM

## 2014-03-02 DIAGNOSIS — J441 Chronic obstructive pulmonary disease with (acute) exacerbation: Secondary | ICD-10-CM

## 2014-03-02 DIAGNOSIS — R059 Cough, unspecified: Secondary | ICD-10-CM

## 2014-03-02 MED ORDER — AZITHROMYCIN 250 MG PO TABS: ORAL_TABLET | ORAL | Status: DC

## 2014-03-02 MED ORDER — HYDROCODONE-HOMATROPINE 5-1.5 MG/5ML PO SYRP: 5.0000 mL | ORAL_SOLUTION | Freq: Three times a day (TID) | ORAL | Status: DC | PRN

## 2014-03-02 NOTE — Progress Notes (Signed)
HPI  Pt presents to the clinic today with c/o cough and chest congestion. He reports this started yesterday. The cough is non productive. He is coughing so much that he is vomiting. He denies fever, chills or body aches. He has tried his inhaler and breathing treatments with minimal relief. He does have a history of seasonal allergies and COPD.  Review of Systems      Past Medical History  Diagnosis Date  . Allergic rhinitis   . Hx of adenomatous colonic polyps   . Pulmonary fibrosis   . COPD (chronic obstructive pulmonary disease)   . GERD (gastroesophageal reflux disease)   . Erectile dysfunction   . Cardiomyopathy   . Hyperlipidemia   . Renal insufficiency   . Osteoarthritis   . Diabetes mellitus   . Hypertension   . DVT (deep venous thrombosis), right 5/13    Family History  Problem Relation Age of Onset  . Cancer Neg Hx     History   Social History  . Marital Status: Married    Spouse Name: N/A    Number of Children: 3  . Years of Education: N/A   Occupational History  . retired     IT consultant   Social History Main Topics  . Smoking status: Former Research scientist (life sciences)  . Smokeless tobacco: Never Used  . Alcohol Use: No  . Drug Use: No  . Sexual Activity: Not on file   Other Topics Concern  . Not on file   Social History Narrative   Has platonic lady lfriend since ~2008  [facility]. Currently in nursing home after stroke.      Has living will now   No health care POA--has 3 children   Would like to try resuscitation   Might accept tube feeds if cognitively unaware    Allergies  Allergen Reactions  . Theophyllines      Constitutional:  Denies headache, fatigue, fever or abrupt weight changes.  HEENT:  Positive Denies eye redness, eye pain, pressure behind the eyes, facial pain, nasal congestion, ear pain, ringing in the ears, wax buildup, runny nose or bloody nose. Respiratory: Positive cough. Denies difficulty breathing or shortness of breath.   Cardiovascular: Denies chest pain, chest tightness, palpitations or swelling in the hands or feet.   No other specific complaints in a complete review of systems (except as listed in HPI above).  Objective:   BP 120/60  Pulse 72  Temp(Src) 98.2 F (36.8 C) (Oral)  Wt 192 lb (87.091 kg)  SpO2 97% Wt Readings from Last 3 Encounters:  03/02/14 192 lb (87.091 kg)  02/03/14 197 lb (89.359 kg)  11/01/13 188 lb (85.276 kg)     General: Appears his stated age, chronically ill appearing in NAD. HEENT: Head: normal shape and size; Ears: Tm's gray and intact, normal light reflex; Nose: mucosa pink and moist, septum midline; Throat/Mouth: + PND. Teeth present, mucosa erythematous and moist, no exudate noted, no lesions or ulcerations noted.  Cardiovascular: Normal rate and rhythm. S1,S2 noted.  No murmur, rubs or gallops noted.  Pulmonary/Chest: Normal effort and bilateral expiratory wheezing noted. No respiratory distress. No  rales or ronchi noted.      Assessment & Plan:   COPD exacerbation:  Continue your inhalers eRx for Azithromax x 5 days eRx for Hycodan cough syrup  RTC as needed or if symptoms persist.

## 2014-03-02 NOTE — Progress Notes (Signed)
Pre visit review using our clinic review tool, if applicable. No additional management support is needed unless otherwise documented below in the visit note. 

## 2014-03-02 NOTE — Patient Instructions (Addendum)

## 2014-04-03 ENCOUNTER — Other Ambulatory Visit: Payer: Self-pay | Admitting: Internal Medicine

## 2014-04-03 NOTE — Telephone Encounter (Signed)
Pt left v/m requesting status of temazepam refill;pt called refill to pharmacy on 03/30/14; pt request cb.

## 2014-04-03 NOTE — Telephone Encounter (Signed)
rx called into pharmacy

## 2014-04-03 NOTE — Telephone Encounter (Signed)
Pt called to check on the status of temazepam refill. I told pt you would get to it as soon as you could due to computer system being down. Pt is completely out of medication.

## 2014-04-03 NOTE — Telephone Encounter (Signed)
02/28/14 

## 2014-04-11 DIAGNOSIS — E1142 Type 2 diabetes mellitus with diabetic polyneuropathy: Secondary | ICD-10-CM | POA: Diagnosis not present

## 2014-04-11 DIAGNOSIS — L851 Acquired keratosis [keratoderma] palmaris et plantaris: Secondary | ICD-10-CM | POA: Diagnosis not present

## 2014-04-11 DIAGNOSIS — B351 Tinea unguium: Secondary | ICD-10-CM | POA: Diagnosis not present

## 2014-04-18 DIAGNOSIS — J449 Chronic obstructive pulmonary disease, unspecified: Secondary | ICD-10-CM | POA: Diagnosis not present

## 2014-04-18 DIAGNOSIS — R0602 Shortness of breath: Secondary | ICD-10-CM | POA: Diagnosis not present

## 2014-04-18 DIAGNOSIS — Z23 Encounter for immunization: Secondary | ICD-10-CM | POA: Diagnosis not present

## 2014-04-18 DIAGNOSIS — J441 Chronic obstructive pulmonary disease with (acute) exacerbation: Secondary | ICD-10-CM | POA: Diagnosis not present

## 2014-04-18 DIAGNOSIS — J431 Panlobular emphysema: Secondary | ICD-10-CM | POA: Diagnosis not present

## 2014-04-18 DIAGNOSIS — E1165 Type 2 diabetes mellitus with hyperglycemia: Secondary | ICD-10-CM | POA: Diagnosis not present

## 2014-04-29 ENCOUNTER — Telehealth: Payer: Self-pay | Admitting: Internal Medicine

## 2014-05-01 ENCOUNTER — Other Ambulatory Visit: Payer: Self-pay | Admitting: Internal Medicine

## 2014-05-01 NOTE — Telephone Encounter (Signed)
04/03/14 

## 2014-05-01 NOTE — Telephone Encounter (Signed)
Approved: 30 x 0 

## 2014-05-01 NOTE — Telephone Encounter (Signed)
rx called into pharmacy

## 2014-05-01 NOTE — Telephone Encounter (Signed)
Pt left v/m requesting refill [facility] to [facility].Please advise.

## 2014-05-12 DIAGNOSIS — R809 Proteinuria, unspecified: Secondary | ICD-10-CM | POA: Diagnosis not present

## 2014-05-12 DIAGNOSIS — N2581 Secondary hyperparathyroidism of renal origin: Secondary | ICD-10-CM | POA: Diagnosis not present

## 2014-05-12 DIAGNOSIS — E1122 Type 2 diabetes mellitus with diabetic chronic kidney disease: Secondary | ICD-10-CM | POA: Diagnosis not present

## 2014-05-12 DIAGNOSIS — I10 Essential (primary) hypertension: Secondary | ICD-10-CM | POA: Diagnosis not present

## 2014-05-12 DIAGNOSIS — N183 Chronic kidney disease, stage 3 (moderate): Secondary | ICD-10-CM | POA: Diagnosis not present

## 2014-05-25 NOTE — Telephone Encounter (Addendum)
Pt left v/m pt was told by [facility] not get novolin N until 05/31/2014 and pt does not think has enough novolin N to last until 05/01/15 and request refill . Left v/m for pt to cb. Spoke with  Louie Casa at [facility] and they did not fill NOvolin N; was filled at another pharmacy.

## 2014-05-29 ENCOUNTER — Telehealth: Payer: Self-pay

## 2014-05-29 ENCOUNTER — Other Ambulatory Visit: Payer: Self-pay | Admitting: Internal Medicine

## 2014-05-29 NOTE — Telephone Encounter (Signed)
Pt request refill on temazepam and novolin N; pt still has 4 days of temazepam left but not novolin N. Spoke with pharmacist at [facility] and novolin filled last month at [facility]. Asked pharmacist per pt request to have transferred back to [facility]; pharmacist voiced understanding. Pt will ck with pharmacy later today to pick up  Novolin N.

## 2014-05-29 NOTE — Telephone Encounter (Signed)
Approved: 30 x 0 

## 2014-05-29 NOTE — Telephone Encounter (Signed)
05/01/14 

## 2014-05-29 NOTE — Telephone Encounter (Signed)
See 05/29/2014 phone note.

## 2014-05-29 NOTE — Telephone Encounter (Signed)
rx called into pharmacy

## 2014-05-31 ENCOUNTER — Encounter: Payer: Self-pay | Admitting: Internal Medicine

## 2014-06-01 ENCOUNTER — Other Ambulatory Visit: Payer: Self-pay | Admitting: *Deleted

## 2014-06-01 MED ORDER — INSULIN NPH (HUMAN) (ISOPHANE) 100 UNIT/ML ~~LOC~~ SUSP: 25.0000 [IU] | Freq: Two times a day (BID) | SUBCUTANEOUS | Status: DC

## 2014-06-01 NOTE — Telephone Encounter (Signed)
Pt came to office because [facility] tells pt he does not have refills available on Novolin N. Pt request to hear my call to [facility] and pt came to my desk and did hear call to [facility] pharmacist at [place] who said Novolin N would be ready for pick up when pt got to pharmacy. Pt voiced understanding and was appreciative.

## 2014-06-25 ENCOUNTER — Other Ambulatory Visit: Payer: Self-pay | Admitting: Internal Medicine

## 2014-06-26 DIAGNOSIS — X32XXXA Exposure to sunlight, initial encounter: Secondary | ICD-10-CM | POA: Diagnosis not present

## 2014-06-26 DIAGNOSIS — L570 Actinic keratosis: Secondary | ICD-10-CM | POA: Diagnosis not present

## 2014-06-26 DIAGNOSIS — L821 Other seborrheic keratosis: Secondary | ICD-10-CM | POA: Diagnosis not present

## 2014-06-26 DIAGNOSIS — Z85828 Personal history of other malignant neoplasm of skin: Secondary | ICD-10-CM | POA: Diagnosis not present

## 2014-06-26 NOTE — Telephone Encounter (Signed)
Approved: 30 x 0 

## 2014-06-26 NOTE — Telephone Encounter (Signed)
Electronic Rx request received for temazepam. Patient's last office visit was 02/03/14 and Rx last filled 05/29/14. Please advise.

## 2014-06-28 ENCOUNTER — Other Ambulatory Visit: Payer: Self-pay | Admitting: Internal Medicine

## 2014-06-28 MED ORDER — TEMAZEPAM 30 MG PO CAPS: 30.0000 mg | ORAL_CAPSULE | Freq: Every day | ORAL | Status: DC

## 2014-06-28 NOTE — Telephone Encounter (Deleted)
Last OV with you 01/2014--has upcoming appt 07/2014--Rx last filled

## 2014-06-28 NOTE — Telephone Encounter (Signed)
Rx called in to pharmacy. 

## 2014-06-28 NOTE — Addendum Note (Signed)
Addended by: Lurlean Nanny on: 06/28/2014 05:10 PM   Modules accepted: Orders

## 2014-07-10 ENCOUNTER — Other Ambulatory Visit: Payer: Self-pay | Admitting: Internal Medicine

## 2014-07-15 ENCOUNTER — Other Ambulatory Visit: Payer: Self-pay | Admitting: Internal Medicine

## 2014-07-17 ENCOUNTER — Other Ambulatory Visit: Payer: Self-pay | Admitting: Internal Medicine

## 2014-07-18 DIAGNOSIS — B351 Tinea unguium: Secondary | ICD-10-CM | POA: Diagnosis not present

## 2014-07-18 DIAGNOSIS — E1142 Type 2 diabetes mellitus with diabetic polyneuropathy: Secondary | ICD-10-CM | POA: Diagnosis not present

## 2014-07-18 DIAGNOSIS — L851 Acquired keratosis [keratoderma] palmaris et plantaris: Secondary | ICD-10-CM | POA: Diagnosis not present

## 2014-07-29 ENCOUNTER — Other Ambulatory Visit: Payer: Self-pay | Admitting: Internal Medicine

## 2014-07-31 NOTE — Telephone Encounter (Signed)
Px written for call in   

## 2014-07-31 NOTE — Telephone Encounter (Signed)
Received refill request electronically. Last refill 06/28/14, last office visit 03/02/14. Is it okay to refill medication?

## 2014-08-01 DIAGNOSIS — H40003 Preglaucoma, unspecified, bilateral: Secondary | ICD-10-CM | POA: Diagnosis not present

## 2014-08-01 LAB — HM DIABETES EYE EXAM

## 2014-08-01 NOTE — Telephone Encounter (Signed)
[facility]

## 2014-08-07 ENCOUNTER — Ambulatory Visit (INDEPENDENT_AMBULATORY_CARE_PROVIDER_SITE_OTHER): Payer: Medicare Other | Admitting: Internal Medicine

## 2014-08-07 ENCOUNTER — Encounter: Payer: Self-pay | Admitting: Internal Medicine

## 2014-08-07 VITALS — BP 110/60 | HR 74 | Temp 97.7°F | Ht 67.5 in | Wt 196.0 lb

## 2014-08-07 DIAGNOSIS — N183 Chronic kidney disease, stage 3 unspecified: Secondary | ICD-10-CM

## 2014-08-07 DIAGNOSIS — Z0000 Encounter for general adult medical examination without abnormal findings: Secondary | ICD-10-CM

## 2014-08-07 DIAGNOSIS — E1149 Type 2 diabetes mellitus with other diabetic neurological complication: Secondary | ICD-10-CM

## 2014-08-07 DIAGNOSIS — E1140 Type 2 diabetes mellitus with diabetic neuropathy, unspecified: Secondary | ICD-10-CM

## 2014-08-07 DIAGNOSIS — E1129 Type 2 diabetes mellitus with other diabetic kidney complication: Secondary | ICD-10-CM

## 2014-08-07 DIAGNOSIS — E785 Hyperlipidemia, unspecified: Secondary | ICD-10-CM

## 2014-08-07 DIAGNOSIS — N058 Unspecified nephritic syndrome with other morphologic changes: Secondary | ICD-10-CM | POA: Diagnosis not present

## 2014-08-07 DIAGNOSIS — E1142 Type 2 diabetes mellitus with diabetic polyneuropathy: Secondary | ICD-10-CM

## 2014-08-07 DIAGNOSIS — J439 Emphysema, unspecified: Secondary | ICD-10-CM | POA: Diagnosis not present

## 2014-08-07 LAB — LIPID PANEL
Cholesterol: 75 mg/dL (ref 0–200)
HDL: 40.9 mg/dL (ref >39.00)
LDL CALC: 19 mg/dL (ref 0–99)
NonHDL: 34.1
Total CHOL/HDL Ratio: 2
Triglycerides: 75 mg/dL (ref 0.0–149.0)
VLDL: 15 mg/dL (ref 0.0–40.0)

## 2014-08-07 LAB — COMPREHENSIVE METABOLIC PANEL
ALT: 24 U/L (ref 0–53)
AST: 22 U/L (ref 0–37)
Albumin: 4.1 g/dL (ref 3.5–5.2)
Alkaline Phosphatase: 94 U/L (ref 39–117)
BUN: 44 mg/dL — ABNORMAL HIGH (ref 6–23)
CO2: 31 meq/L (ref 19–32)
CREATININE: 1.67 mg/dL — AB (ref 0.40–1.50)
Calcium: 9.6 mg/dL (ref 8.4–10.5)
Chloride: 109 mEq/L (ref 96–112)
GFR: 41.83 mL/min — ABNORMAL LOW (ref >60.00)
Glucose, Bld: 105 mg/dL — ABNORMAL HIGH (ref 70–99)
Potassium: 4.6 mEq/L (ref 3.5–5.1)
Sodium: 145 mEq/L (ref 135–145)
Total Bilirubin: 0.6 mg/dL (ref 0.2–1.2)
Total Protein: 7.3 g/dL (ref 6.0–8.3)

## 2014-08-07 LAB — CBC WITH DIFFERENTIAL/PLATELET
Basophils Absolute: 0.1 10*3/uL (ref 0.0–0.1)
Basophils Relative: 0.8 % (ref 0.0–3.0)
EOS ABS: 0.2 10*3/uL (ref 0.0–0.7)
Eosinophils Relative: 2.6 % (ref 0.0–5.0)
HCT: 44 % (ref 39.0–52.0)
Hemoglobin: 14.8 g/dL (ref 13.0–17.0)
Lymphocytes Relative: 25.7 % (ref 12.0–46.0)
Lymphs Abs: 2.1 10*3/uL (ref 0.7–4.0)
MCHC: 33.8 g/dL (ref 30.0–36.0)
MCV: 95.5 fl (ref 78.0–100.0)
MONO ABS: 0.5 10*3/uL (ref 0.1–1.0)
Monocytes Relative: 6.5 % (ref 3.0–12.0)
NEUTROS PCT: 64.4 % (ref 43.0–77.0)
Neutro Abs: 5.2 10*3/uL (ref 1.4–7.7)
PLATELETS: 201 10*3/uL (ref 150.0–400.0)
RBC: 4.6 Mil/uL (ref 4.22–5.81)
RDW: 14 % (ref 11.5–15.5)
WBC: 8.1 10*3/uL (ref 4.0–10.5)

## 2014-08-07 LAB — T4, FREE: Free T4: 0.69 ng/dL (ref 0.60–1.60)

## 2014-08-07 LAB — HEMOGLOBIN A1C: Hgb A1c MFr Bld: 7.7 % — ABNORMAL HIGH (ref 4.6–6.5)

## 2014-08-07 LAB — HM DIABETES FOOT EXAM

## 2014-08-07 NOTE — Progress Notes (Signed)
Pre visit review using our clinic review tool, if applicable. No additional management support is needed unless otherwise documented below in the visit note. 

## 2014-08-07 NOTE — Assessment & Plan Note (Signed)
No problems with statin Due for labs 

## 2014-08-07 NOTE — Assessment & Plan Note (Signed)
Hopefully still acceptable control Will check labs May have early vascular issues also--though no claudication

## 2014-08-07 NOTE — Assessment & Plan Note (Signed)
Stable on inhalation Rx and oxygen Walks some every day

## 2014-08-07 NOTE — Progress Notes (Signed)
Subjective:    Patient ID: Miguel Huber, male    DOB: 11/11/1927, 81 y.o.   MRN: [id number]  HPI Here for Medicare wellness and follow up of chronic medical conditions Reviewed form and advanced directives No tobacco or alcohol Reviewed other physicians Having hearing problems--thinks he needs wax cleaned out Recent eye exam-vision okay Tries to walk daily Did fall once in church--walker tilted under weight of his oxygen tank. No injury No depression or anhedonia. Still sees friend Stanton Kidney who is in [employer] (go to church and then [employer] every Sunday) Still drives and independent with instrumental ADLs Mild memory issues---no functional problems  Checks his sugars in AM and PM 160-170 both times No serious hypoglycemic reactions--but will occasionally feel shaky Feet still numb--no sig pain and no ulcers  Breathing has been his usual---stable DOE On oxygen pretty much all the time Only occasional cough--to clear throat No wheezing  No chest pain  Will feel his heart racing with exertion--goes away with rest No dizziness or syncope No sig edema on the diuretic  Stomach fine Heartburn is controlled Will rarely have some swallowing problems when he eats too fast (habit since in the army)  Still has back pain Notes especially if he bends over--like to fold laundry Some right knee pain also---wears brace  Current Outpatient Prescriptions on File Prior to Visit  Medication Sig Dispense Refill  . allopurinol (ZYLOPRIM) 100 MG tablet Take 100 mg by mouth daily.     . CRESTOR 20 MG tablet TAKE 1 TABLET (20 MG TOTAL) BY MOUTH DAILY. 90 tablet 1  . furosemide (LASIX) 80 MG tablet Take 1 tablet (80 mg total) by mouth daily. 90 tablet 3  . insulin NPH Human (NOVOLIN N) 100 UNIT/ML injection Inject 0.25 mLs (25 Units total) into the skin 2 (two) times daily before a meal. Dx: E11.29 5 vial 3  . Insulin Syringe-Needle U-100 (B-D INS SYRINGE 0.5CC/30GX1/2") 30G X 1/2" 0.5  ML MISC Use as directed to inject insulin dx: 250.00 100 each 3  . ipratropium-albuterol (DUONEB) 0.5-2.5 (3) MG/3ML SOLN Take 3 mLs by nebulization 4 (four) times daily. Dx: J44.9 360 mL 3  . lisinopril (PRINIVIL,ZESTRIL) 10 MG tablet TAKE 1 TABLET BY MOUTH EVERY DAY 90 tablet 0  . NOVOLIN R 100 UNIT/ML injection INJECT 10 UNITS TWO TIMES A DAY 10 mL 5  . omeprazole (PRILOSEC) 20 MG capsule Take 1 capsule (20 mg total) by mouth daily. 90 capsule 3  . polyethylene glycol (MIRALAX / GLYCOLAX) packet Take 17 g by mouth daily.    . temazepam (RESTORIL) 30 MG capsule TAKE ONE CAPSULE BY MOUTH EVERY NIGHT AT BEDTIME 30 capsule 0   No current facility-administered medications on file prior to visit.    Allergies  Allergen Reactions  . Theophyllines     Past Medical History  Diagnosis Date  . Allergic rhinitis   . Hx of adenomatous colonic polyps   . Pulmonary fibrosis   . COPD (chronic obstructive pulmonary disease)   . GERD (gastroesophageal reflux disease)   . Erectile dysfunction   . Cardiomyopathy   . Hyperlipidemia   . Renal insufficiency   . Osteoarthritis   . Diabetes mellitus   . Hypertension   . DVT (deep venous thrombosis), right 5/13    Past Surgical History  Procedure Laterality Date  . Orif clavicle fracture      Left clavicle fracture- child  . Orif ulnar fracture      Left readius/ ulna  fracture child  . Lung biopsy      Right lung bx. negative 2001  . Cataract extraction      Cataracts/ IOL OU, Epes 2005, [place] ?  2000  . Cardiovascular stress test      Myoview stress negative EF 65-70% 08/04    Family History  Problem Relation Age of Onset  . Cancer Neg Hx     History   Social History  . Marital Status: Married    Spouse Name: N/A  . Number of Children: 3  . Years of Education: N/A   Occupational History  . retired     IT consultant   Social History Main Topics  . Smoking status: Former Research scientist (life sciences)  . Smokeless tobacco: Never Used  .  Alcohol Use: No  . Drug Use: No  . Sexual Activity: Not on file   Other Topics Concern  . Not on file   Social History Narrative   Has platonic lady lfriend since ~2008  [facility]. Currently in nursing home after stroke.      Has living will now   No health care POA--has 3 children   Would like to try resuscitation--would accept ventilator only temporarily   Might accept tube feeds if cognitively unaware   Review of Systems Appetite is fine Weight is up a few pounds Sleeps okay--uses the temazepam nightly Bowels are a little slow--uses miralax daily now     Objective:   Physical Exam  Constitutional: He is oriented to person, place, and time. He appears well-developed and well-nourished.  HENT:  Mouth/Throat: Oropharynx is clear and moist. No oropharyngeal exudate.  Some cerumen on left? edentulous  Neck: Normal range of motion. Neck supple. No thyromegaly present.  Cardiovascular: Normal rate and regular rhythm.  Exam reveals no gallop.   No murmur heard. Very faint or absent pedal pulses  Pulmonary/Chest: Effort normal. No respiratory distress. He has no wheezes.  Decreased breath sounds Faint dry bibasilar crackles  Abdominal: Soft. There is no tenderness.  Musculoskeletal: He exhibits no edema or tenderness.  Lymphadenopathy:    He has no cervical adenopathy.  Neurological: He is alert and oriented to person, place, and time.  President-- "[employer], ...." 100- not good at figures (went through 11th grade) D-l-o-w Recall-- 3/3  Decreased fine touch sensation in feet  Skin: No rash noted.  No foot lesions  Psychiatric: He has a normal mood and affect. His behavior is normal.          Assessment & Plan:

## 2014-08-07 NOTE — Assessment & Plan Note (Signed)
I have personally reviewed the Medicare Annual Wellness questionnaire and have noted 1. The patient's medical and social history 2. Their use of alcohol, tobacco or illicit drugs 3. Their current medications and supplements 4. The patient's functional ability including ADL's, fall risks, home safety risks and hearing or visual             impairment. 5. Diet and physical activities 6. Evidence for depression or mood disorders  The patients weight, height, BMI and visual acuity have been recorded in the chart I have made referrals, counseling and provided education to the patient based review of the above and I have provided the pt with a written personalized care plan for preventive services.  I have provided you with a copy of your personalized plan for preventive services. Please take the time to review along with your updated medication list.  No cancer screening due to age UTD with imms except zostavax---wants to hold off due to cost  ??mild memory issues---not clear cut and no functional problems

## 2014-08-07 NOTE — Assessment & Plan Note (Signed)
Check labs 

## 2014-08-07 NOTE — Assessment & Plan Note (Signed)
On ACEI  Will recheck labs 

## 2014-08-07 NOTE — Assessment & Plan Note (Signed)
Numbness but no pain so Rx not needed

## 2014-08-08 ENCOUNTER — Encounter: Payer: Self-pay | Admitting: *Deleted

## 2014-08-22 DIAGNOSIS — J431 Panlobular emphysema: Secondary | ICD-10-CM | POA: Diagnosis not present

## 2014-08-22 DIAGNOSIS — J449 Chronic obstructive pulmonary disease, unspecified: Secondary | ICD-10-CM | POA: Diagnosis not present

## 2014-08-26 ENCOUNTER — Other Ambulatory Visit: Payer: Self-pay | Admitting: Family Medicine

## 2014-08-28 NOTE — Telephone Encounter (Signed)
rx called into pharmacy

## 2014-08-28 NOTE — Telephone Encounter (Signed)
Temazepam refill request.  Last seen 08/07/2014.  Last filled 07/31/2014.  Please advise.

## 2014-08-28 NOTE — Telephone Encounter (Signed)
Approved: 30 x 0 

## 2014-09-14 ENCOUNTER — Other Ambulatory Visit: Payer: Self-pay | Admitting: Internal Medicine

## 2014-09-17 NOTE — Discharge Summary (Signed)
PATIENT NAME:  Miguel Huber, Miguel Huber MR#:  [id number] DATE OF BIRTH:  [birth year]  DATE OF ADMISSION:  10/10/2011 DATE OF DISCHARGE:  10/14/2011  PRIMARY CARE PHYSICIAN: Viviana Simpler, MD  REASON FOR ADMISSION: Right leg swelling.   DISCHARGE DIAGNOSES:  1. Right lower extremity deep vein thrombosis.  2. Chronic respiratory failure with oxygen-dependent chronic obstructive pulmonary disease. 3. Oxygen-dependent chronic obstructive pulmonary disease. 4. Hypertension. 5. Diabetes mellitus, insulin requiring. 6. History of cardiomyopathy with preserved LVEF per echocardiogram in December 2010.  7. Stage III chronic kidney disease, baseline creatinine around 1.5 to 1.6.  8. Hyperlipidemia. 9. Gastroesophageal reflux disease.  10. History of lower extremity deep vein thrombosis in teenage years of unclear cause. The patient states he took Coumadin for approximately two years at that time.  11. History of pulmonary fibrosis.  CONSULTANTS: Hortencia Pilar, MD - Vascular Surgery.  DISCHARGE DISPOSITION: Home with home health and nursing.   DISCHARGE MEDICATIONS:  1. Lovenox 1 mg/kg (95 mg) subcutaneously every 12 hours starting 10/15/2011 until INR is therapeutic. 2. Coumadin 5 mg p.o. daily, starting 10/15/2011. 3. Aspirin 81 mg daily.  4. Advair Diskus 250/50 one dose inhaled twice a day. 5. Combivent metered dose inhaler 2 puffs inhaled every six hours p.r.n.  6. DuoNebs one dose inhaled every six hours p.r.n.  7. Potassium chloride 20 mEq daily.  8. Lasix 80 mg daily.  9. Omeprazole 20 mg daily.  10. Simvastatin 20 mg daily. 11. Temazepam 30 mg p.o. at bedtime. 12. Colcrys 0.6 mg p.o. twice a day. 13. Lisinopril 10 mg daily. 14. Novolin N 25 units subcutaneously twice a day.  15. Novolin R 10 units subcutaneously twice a day.  16. MiraLax 17 grams p.o. daily.  17. Tramadol 50 mg 1 to 2 tablets p.o. daily p.r.n.  18. Ketoconazole 2% topical cream one application to groin twice a day  until rash is clear.   DISCHARGE CONDITION: Improved, stable.   DISCHARGE ACTIVITY: As tolerated.   DISCHARGE DIET: Low sodium, ADA, low fat, low cholesterol.  DISCHARGE INSTRUCTIONS: 1. Take medication as prescribed.  2. Return to the emergency department for recurrence of symptoms or worsening right leg swelling or pain or numbness or tingling or return to the emergency department for any bleeding complications, also return to the emergency department if you develop any chest pain or shortness of breath.   FOLLOWUP INSTRUCTIONS:  1. Follow up with Dr. Viviana Simpler within one week. The patient needs repeat PT/INR in the next 2 to 3 days with results called or faxed to Dr. Henrene Dodge office. Primary MD to please instruct the patient to stop taking Lovenox once his INR reaches therapeutic. The patient also needs repeat BMP within one week. Home health nurse to draw PT/ INR on 10/17/2011 and call/fax results to Dr. Alla German office, and the patient should be instructed to stop taking Lovenox once his INR reaches therapeutic, between 2.0 and 3.2, and primary care physician should provide further instructions in regards to Coumadin dosing.  2. Followup with Dr. Devona Konig within 2 to 3 weeks.  3. Followup with your cardiologist in 2 to 3 weeks.  4. Followup with Dr. Hortencia Pilar within 2 to 3 weeks.  REFERRALS:  1. The patient is being referred to first available physician at the [facility] for hypercoagulable work-up.  2. The patient is being referred to first available nephrologist at [facility] for CKD management.   PROCEDURES: Right lower extremity venous Doppler ultrasound on  10/10/2011: There is an occlusive thrombus in the right common femoral vein and greater saphenous vein, nonocclusive thrombus noted in the right femoral vein and popliteal vein.   PERTINENT LABS/STUDIES: Protein C and S levels within normal limits. Factor V Leiden mutation and  factor mutation analysis were negative. Antiphospholipid antibody syndrome was negative.   Renal function on admission: BUN 30, creatinine 1.34, and creatinine 1.10, on 10/12/2011. CBC normal on admission, except for WBC elevated slightly at 11.2. WBC normal at 9.1 from 10/11/2011. INR was 1 on admission and 1.2 on the day of discharge.   BRIEF HISTORY AND HOSPITAL COURSE: The patient is a pleasant 81 year old male with past medical history of chronic respiratory failure with oxygen-dependent chronic obstructive pulmonary disease, history of stage III CKD with baseline creatinine around 1.5 to 1.6, hypertension, hyperlipidemia, diabetes mellitus, cardiomyopathy with normal LVEF, chronic obstructive pulmonary disease, pulmonary fibrosis, and GERD who presented to the emergency department with significant right lower extremity swelling and pain. Please see the dictated admission history and physical for pertinent details surrounding the onset of this hospitalization and please see below for further details.  1. Right lower extremity deep vein thrombosis - exact etiology is unclear at this time. The patient denied any periods of prolonged immobility. He denied any trauma. He has no underlying history of malignancy to this date. He does have history of lower extremity deep vein thrombosis in his teenage years of unclear etiology and he states he took Coumadin for approximately two weeks at that time. I am not sure if the patient has underlying hypercoagulable work-up. Studies that we sent came back negative thus far with results as mentioned above. However a full hypercoagulable panel was not sent as the patient was promptly started on heparin in the ER and he will be referred to the hematology clinic upon discharge for hypercoagulable work-up and to determine duration of treatment, but for now he will need at least six months of anticoagulation. He has been tolerating anticoagulation quite well during this  hospitalization. Initially he was started on a heparin drip. Shortly thereafter he was started on Coumadin. He still has significant right lower extremity swelling, however, this is improved since admission and his pain has resolved involving his right leg. He was seen in consultation by vascular surgeon, Dr. Hortencia Pilar, who felt that there is no definite indication for IVC filter insertion at this time as the patient appears to be tolerating anticoagulation well at this time, but there is a low threshold to place an IVC filter given the patient's chronic respiratory failure and the patient will be following up with Dr. Hortencia Pilar as an outpatient in this regard to see if he would be an appropriate candidate for IVC filter insertion. There were no indications for thrombolytics or surgical thrombectomy as the patient has remained hemodynamically stable with no concern for limb loss at this time. He was also advised limb elevation. With limb elevation and with anticoagulation overall his clinical condition has significantly improved with significant improvement of his edema and swelling on the right leg and also resolution of his right leg pain. The patient has been instructed and educated in regards to how to self administer Lovenox with which he felt comfortable doing. Thereafter he was taken off the heparin drip and transitioned over to sub-Q Lovenox which he will be using twice a day at home subcutaneously in addition to Coumadin and his primary care physician will be following the patient's PT/INR which will be drawn  by the home health team and once the patient's INR is therapeutic he will be instructed to stop taking Lovenox and continue Coumadin. Further management is being deferred to the patient's primary care physician. The patient felt agreeable to being discharged home with self Lovenox injections and taking Coumadin.  2. In regards to chronic respiratory failure with oxygen-dependent chronic  obstructive pulmonary disease and pulmonary fibrosis, this has remained stable. The patient is to continue supplemental oxygen via nasal cannula at his baseline and continue Advair and bronchodilators and nebulizers and MDIs and he will continue to followup with his pulmonologist, Dr. Humphrey Rolls, upon hospital discharge.  3. Hypertension - blood pressure has been well controlled. The patient is to continue Lasix and lisinopril.  4. Type 2 diabetes mellitus - insulin requiring. The patient is to continue using his insulin regimen as he was doing before at home. 5. History of cardiomyopathy with preserved LVEF per echocardiogram in December 2010 with normal LVEF. This has been stable and well compensated. The patient is to continue Lasix for some lower extremity edema and also to continue ACE inhibitor therapy and he will continue to followup with cardiology closely upon discharge. He is not on a beta blocker given risk of bronchospasm. 6. Stage III chronic kidney disease with baseline creatinine around 1.5 to 1.6. Renal function is currently at baseline, actually better than baseline during this hospitalization. He will need close monitoring of his renal function as an outpatient and he will be referred to nephrology as an outpatient for ongoing CKD management.  7. Hyperlipidemia - the patient is to continue statin therapy.  8. Gastroesophageal reflux disease - the patient is to continue PPI therapy.   The patient was seen by physical therapy prior to discharge and was felt to be an appropriate candidate to be discharged home with home health, which has been arranged for this patient. On 10/14/2011, the patient was hemodynamically stable with improvement of his right leg swelling and resolution of his right leg pain and he was felt to be stable for discharge home with home health and close outpatient follow-up to which the patient was agreeable.   TIME SPENT ON DISCHARGE: Greater than 30  minutes. ____________________________ Romie Jumper, MD knl:slb D: 10/18/2011 14:11:00 ET T: 10/20/2011 12:28:56 ET JOB#: [id number]  cc: Romie Jumper, MD, <Dictator> Venia Carbon, MD Katha Cabal, MD [facility] Allyne Gee, MD Romie Jumper MD ELECTRONICALLY SIGNED 10/27/2011 18:12

## 2014-09-17 NOTE — H&P (Signed)
PATIENT NAME:  Miguel Huber, Miguel Huber MR#:  [id number] DATE OF BIRTH:  May 19, 1928  DATE OF ADMISSION:  10/10/2011  PRIMARY CARE PHYSICIAN: Viviana Simpler, MD  CHIEF COMPLAINT: "My right leg is swollen.  It is almost twice the size of the other leg."  HISTORY OF PRESENT ILLNESS: An 81 year old male who has multiple medical problems. He has chronic obstructive pulmonary disease, pulmonary fibrosis. He follows with Dr. Devona Konig.  He has chronic respiratory failure, uses oxygen at 2 liters per minute 24/7, diabetes mellitus. He has cardiomyopathy, hyperlipidemia. He has chronic kidney disease, stage III. He said that he started having this right lower extremity swelling 2 days ago on Wednesday and according to the friend who is accompanying him the swelling was almost twice the size of the other leg. He was also complaining of some soreness in the right thigh area. He had some problems, difficulty walking also with this right lower extremity swelling. He went to see Dr. Silvio Pate today. He ordered an ultrasound of his right lower extremity and that came back as significant DVT occluded thrombus in the right common femoral vein and greater saphenous vein and nonocclusive thrombus in the right femoral vein and popliteal vein. He denies any chest pain or any pleuritic chest pain. He has baseline dyspnea on exertion. He denies any increasing shortness of breath. He is not bleeding from any site. He walks with a walker at home. His friend says that he is fairly active. He did not have any recent surgery. He had a deep vein thrombosis, he says, a long time ago when he was a teenager; and he did not know what caused the deep vein thrombosis at that time. He is being admitted for new onset deep vein thrombosis which is proximal. He has been initiated on heparin drip by the ER already.    REVIEW OF SYSTEMS: He denies any fever, any weakness. No acute change in vision. No headache. No dizziness. No cough. He has baseline  dyspnea on exertion. He has chronic obstructive pulmonary disease but no painful respiration. No wheezing. No chest pain right now. No orthopnea. No palpitations or syncope. He says that walking to the bathroom and back he would be short of breath. This is his baseline dyspnea on exertion. No nausea, vomiting, diarrhea, abdominal pain, GI bleed. He denies any dysuria, frequency. He is complaining of constipation. No thyroid problems. No anemia. No bleeding from any site. No rash. Mainly complaining of right lower extremity swelling.  No focal numbness or weakness or anxiety.   PAST MEDICAL HISTORY: He has chronic obstructive pulmonary disease, pulmonary fibrosis, chronic respiratory failure , uses 2 liters of oxygen at home, diabetes mellitus, gastroesophageal reflux disease, cardiomyopathy, hyperlipidemia. He has chronic kidney disease, stage III. His last creatinine in December of 2010 was in the range of 1.5 to 1.6.  Hyperlipidemia. His last echocardiogram in December of 2010 shows that he has normal LV systolic function, ejection fraction of 65% to 70%, mild to moderate concentric left ventricular hypertrophy and no valvular abnormalities. He was last admitted in December of 2010 because of acute on chronic respiratory failure and pneumonia and chronic obstructive pulmonary disease exacerbation.   PAST SURGICAL HISTORY: He had a left clavicular fracture, left radius and ulna fracture as a child, history of right lung biopsy negative in 2001; and he had a Myoview in 2004 which only showed ejection fraction of 65% to 70%.   ALLERGIES TO MEDICATIONS: Theo-Dur.    HOME MEDICATIONS:  1.  Advair Diskus 250/50 b.i.d.  2. Aspirin 81 mg daily.  3. He takes Combivent 2 puffs q.6 hours as needed.  4. DuoNeb q.6 hours as needed.  5. Lasix 80 mg daily.  6. Ketoconazole topical cream to the groin rash twice a day until clear.   7. Klor-Con 20 mEq daily.  8. Lisinopril 10 mg daily.   9. Novolin N 25 units twice  a day.   10. Novolin R 10 units twice a day.   11. Omeprazole 20 mg daily.   12. MiraLAX daily.   13. Simvastatin 20 mg at bedtime. 14. Temazepam 30 mg at bedtime.   15. Tramadol 50 mg once a day at bedtime as needed.   SOCIAL HISTORY: He lives with his son. He usually walks with a walker. He has a friend also who is accompanying him right now. He quit smoking in 1975. He denies any alcohol use.   FAMILY HISTORY: Father had diabetes. Mother died of old age.   PHYSICAL EXAMINATION:  VITAL SIGNS: His vitals when he presented to the ER: Temperature of 98.6, heart rate of 70, respiratory rate 20, blood pressure 98/75, saturating 92% on room air.   GENERAL: He is an elderly Caucasian male, obese, sitting upright in bed, does not appear to be in acute distress.   HEENT: Bilateral pupils are equal, sluggishly reactive. No scleral icterus. No conjunctivitis. Oral mucosa is moist. No pallor.   NECK: No thyroid tenderness, enlargement or nodule. Neck is supple. No masses, nontender. No adenopathy. No JVD. No carotid bruits.   CHEST: Bilateral breath sounds are diminished but no wheezing, normal effort. Not using accessory muscles of respiration.   HEART: Heart sounds are regular. No murmur.   ABDOMEN: Obese, soft, nontender. Normal bowel sounds. No hepatosplenomegaly. No bruit. No masses.   RECTAL: Deferred.    NEUROLOGIC: He is awake, alert, oriented to time, place, and person, decreased hearing, cranial nerves intact. Moving all extremities against gravity. Difficult to lift right lower extremity against gravity because of the weight.    SKIN: He has massive swelling of his right lower extremity, almost more than double the size of the left side. His distal pulses are palpable. There is no significant erythema.    LABORATORY WORK: White count of 11.2, hemoglobin of 15.8, platelet count of 191,000.   BMP: Sodium of 143, potassium of 4.1, BUN of 30, creatinine of 1.34.  GFR of 49. His Doppler  ultrasound of the right lower extremity shows occluded thrombus in the right common femoral vein and greater saphenous vein and nonocclusive thrombus in the right femoral vein and popliteal vein.     IMPRESSION:  1. Extensive new onset proximal and distal right lower extremity deep vein thrombosis, unprovoked.   2. Chronic obstructive pulmonary disease with chronic respiratory failure, stable at this time with pulmonary fibrosis.   3. Diabetes mellitus.   4. Hyperlipidemia.  5. He has chronic kidney disease, stage III.   6. cardiomyopathy/Chronic systolic failure, stable at this time.    PLAN: An 81 year old male who has history of chronic obstructive pulmonary disease, chronic respiratory failure, pulmonary fibrosis, uses 2 liters of oxygen at home. He has baseline dyspnea on exertion. Also has diabetes mellitus, hyperlipidemia, chronic kidney disease, and cardiomyopathy. Today he presented with right lower extremity swelling for about two days and found to have a new onset deep venous thrombosis, which is proximal in the right common femoral vein also with significant swelling. He has been initiated on heparin  drip in the emergency room. We will continue that. The patient can be started on Coumadin tomorrow this is an unprovoked DVTs. He denies any prolonged immobility. He denies any prolonged plane travel, any recent surgery, or any diagnosis of cancer; and we will order some factor V Leiden mutation, protein C and S, prothrombin gene mutation, and antiphospholipid antibody syndrome. Antithrombin III cannot be ordered because the patient is already on heparin drip at this time. The patient uses a walker at home. I am also going to order a PT on him to see his gait stability considering that he will be requiring anticoagulation. We will continue DuoNeb, Advair for his chronic obstructive pulmonary disease which is stable at this time and continue Lasix, lisinopril for his cardiomyopathy and hypertension.  His renal function is stable at this time. His GFR is in the range of 49, and it was with a creatinine of 1.3. He has no clinical evidence of pulmonary embolism at this time. He denies any chest pain and denies any worsening shortness of breath. He has had this baseline dyspnea on exertion which is stable.   TIME SPENT ON ADMISSION:  50 minutes.     ____________________________ Mena Pauls, MD ag:vtd D: 10/10/2011 20:16:28 ET T: 10/11/2011 07:17:05 ET JOB#: [id number]  cc: Mena Pauls, MD, <Dictator> Venia Carbon, MD Mena Pauls MD ELECTRONICALLY SIGNED 11/10/2011 11:29

## 2014-09-17 NOTE — Consult Note (Signed)
General Aspect DVT right leg    Present Illness The patient is an 81 year old male who has multiple medical problems. He has chronic obstructive pulmonary disease, pulmonary fibrosis. He follows with Dr. Devona Konig.  He uses oxygen at 2 liters per minute 24/7. He has cardiomyopathy, hyperlipidemia. He has chronic kidney disease, stage III. He stated that he started having right lower extremity swelling 3 days ago. He was also complaining of some soreness in the right thigh area. An ultrasound of his right lower extremity reported significant DVT in the right common femoral vein and greater saphenous vein. He denies any chest pain or any pleuritic chest pain. He has baseline dyspnea on exertion. He denies any increasing shortness of breath. He had a deep vein thrombosis, he says, a long time ago when he was a teenager; and he did not know what caused the deep vein thrombosis at that time. He is admitted for new onset deep vein thrombosis which is proximal. He has been initiated on heparin drip and has recieved his first dose of Coumadin.  There is no bleeding history or contraindication to anticoagulation at this time.  Past Medical History: 1. Stable cardiomyopathy.  2. Chronic obstructive pulmonary disease with chronic respiratory failure, with pulmonary fibrosis.   3. Diabetes mellitus.   4. Hyperlipidemia.  5. He has chronic kidney disease, stage III.   6. Gastroesophageal reflux  7. Chronic systolic failure, stable at this time.   Home Medications: Medication Instructions Status  Advair Diskus 250 mcg-50 mcg inhalation powder 1 puff(s) inhaled 2 times a day Active  aspirin 81 mg oral tablet 1 tab(s) orally once a day Active  Combivent 18 mcg-103 mcg-/inh inhalation aerosol 2 puff(s) inhaled every 6 hours, As Needed Active  DuoNeb 0.5 mg-2.5 mg/3 mL inhalation solution 3 milliliter(s) inhaled every 6 hours, As Needed Active  Klor-Con M20 oral tablet, extended release 1 tab(s) orally once a  day Active  furosemide 80 mg oral tablet 1 tab(s) orally once a day Active  omeprazole 20 mg oral delayed release capsule 1 cap(s) orally once a day for acid reflux Active  simvastatin 20 mg oral tablet 1 tab(s) orally once a day (at bedtime) Active  temazepam 30 mg oral capsule 1 cap(s) orally once a day (at bedtime) Active  Colcrys 0.6 mg oral tablet 1 tab(s) orally 2 times a day Active  lisinopril 10 mg oral tablet 1 tab(s) orally once a day Active  Novolin N 100 units/mL subcutaneous suspension 25 unit(s) subcutaneous 2 times a day Active  Novolin R 100 units/mL injectable solution 10 unit(s) injectable 2 times a day Active  polyethylene glycol 3350 1 packet(s) (17 grams) orally once a day Active  tramadol 50 mg oral tablet 1-2 tab(s) orally once a day (at bedtime), As Needed- for Pain  Active  ketoconazole 2% topical cream 1 application to groin rash 2 times a day until clear Active    Theo-Dur: Hives  Case History:   Family History Non-Contributory    Social History negative tobacco, negative ETOH, negative Illicit drugs   Review of Systems:   Fever/Chills No    Cough No    Sputum No    Abdominal Pain No    Diarrhea No    Constipation No    Nausea/Vomiting No    SOB/DOE No    Chest Pain No    Telemetry Reviewed NSR    Dysuria No    Tolerating Diet Yes   Physical Exam:  GEN well developed, well nourished, no acute distress    HEENT pink conjunctivae, PERRL, moist oral mucosa, moderately HOH    NECK supple  trachea midline    RESP normal resp effort  no use of accessory muscles  )2 by nasal cannula    CARD regular rate  LE edema present  no JVD    ABD denies tenderness  soft  nondistended    EXTR positive cyanosis/clubbing, positive edema, right leg is significantly larger than the left however the toes are pink and the foot is quite warm.  DP pulse is trace on the right  and 2+ on the left PT is nonpalpable bilaterally.    SKIN No rashes, No  ulcers, tight to palpation    NEURO cranial nerves intact, follows commands, motor/sensory function intact    PSYCH alert, good insight   Nursing/Ancillary Notes: **Vital Signs.:   18-May-13 08:21   Vital Signs Type Routine   Temperature Temperature (F) 98.1   Celsius 36.7   Temperature Source oral   Pulse Pulse 89   Respirations Respirations 20   Systolic BP Systolic BP 242   Diastolic BP (mmHg) Diastolic BP (mmHg) 65   Mean BP 78   Pulse Ox % Pulse Ox % 92   Pulse Ox Activity Level  At rest   Oxygen Delivery 2L   Routine Hem:  18-May-13 02:36    WBC (CBC) 9.1   RBC (CBC) 4.24   Hemoglobin (CBC) 14.1   Hematocrit (CBC) 41.1   Platelet Count (CBC) 184   MCV 97   MCH 33.2   MCHC 34.3   RDW 12.9  Routine Chem:  18-May-13 02:36    Glucose, Serum 148   BUN 29   Creatinine (comp) 1.31   Sodium, Serum 145   Potassium, Serum 3.6   Chloride, Serum 107   CO2, Serum 28   Calcium (Total), Serum 8.4   Anion Gap 10   Osmolality (calc) 297   eGFR (African American) 59   eGFR (Non-African American) 51  Routine Coag:  18-May-13 02:36    Activated PTT (APTT) 63.8  Routine Hem:  18-May-13 02:36    Neutrophil % 56.9   Lymphocyte % 31.4   Monocyte % 8.1   Eosinophil % 3.2   Basophil % 0.4   Neutrophil # 5.2   Lymphocyte # 2.8   Monocyte # 0.7   Eosinophil # 0.3   Basophil # 0.0  Routine Coag:  18-May-13 02:36    Prothrombin 13.9   INR 1.0  Blood Glucose:  18-May-13 07:17    POCT Blood Glucose 162  Routine Coag:  18-May-13 10:10    Activated PTT (APTT) 82.6  Blood Glucose:  18-May-13 11:31    POCT Blood Glucose 235     Impression IMPRESSION:  1. Extensive new onset proximal and distal right lower extremity deep vein thrombosis, unprovoked.                     I agree with Heparin and Coumadin                    No filter at this time as he is tolerating the anticoagulation                                Given his COPD I do maintain a low threshold for  placing a filter  Elevation of the right leg, compression on DC 2. Chronic obstructive pulmonary disease with chronic respiratory failure, pulmonary fibrosis.                       continue home meds                      contnue supplemental O2  3. Diabetes mellitus.                        Continue oral agents                        sliding scale as needed  4. Hyperlipidemia.                          continue statin 5. He has chronic kidney disease, stage III.                           Hydration                         avoid nephrotoxic agents such as contrast as much as possible 6. Stable cardiomyopathy.   7. Chronic systolic failure, stable at this time.    Plan level 4   Electronic Signatures: Hortencia Pilar (MD)  (Signed 18-May-13 16:45)  Authored: General Aspect/Present Illness, Home Medications, Allergies, History and Physical Exam, Vital Signs, Labs, Impression/Plan   Last Updated: 18-May-13 16:45 by Hortencia Pilar (MD)

## 2014-09-20 ENCOUNTER — Telehealth: Payer: Self-pay | Admitting: Internal Medicine

## 2014-09-20 NOTE — Telephone Encounter (Signed)
Destiny from [facility] called requesting notes from pts last visit and was wanting information regarding pt's oxygen. She states that medicare is looking for information regarding oxygen levels Please return call to [phone]. Thanks.

## 2014-09-21 NOTE — Telephone Encounter (Signed)
Left message to have destiny return my call

## 2014-09-21 NOTE — Telephone Encounter (Signed)
See forms on your desk, does pt need to come on to qualify for oxygen? He last seen Dr. Humphrey Rolls 07/2013

## 2014-09-21 NOTE — Telephone Encounter (Signed)
I faxed 08/07/14 office note to [facility].

## 2014-09-21 NOTE — Telephone Encounter (Signed)
Form signed Send copy of 08/09/14 note--I discussed his continuous oxygen needs for emphysema

## 2014-09-22 ENCOUNTER — Other Ambulatory Visit: Payer: Self-pay | Admitting: Internal Medicine

## 2014-09-25 NOTE — Telephone Encounter (Signed)
rx called into pharmacy

## 2014-09-25 NOTE — Telephone Encounter (Signed)
Approved: 30 x 0 

## 2014-09-25 NOTE — Telephone Encounter (Signed)
08/28/14 

## 2014-10-25 ENCOUNTER — Other Ambulatory Visit: Payer: Self-pay | Admitting: Internal Medicine

## 2014-10-26 NOTE — Telephone Encounter (Signed)
rx called into pharmacy

## 2014-10-26 NOTE — Telephone Encounter (Signed)
09/25/2014 

## 2014-10-26 NOTE — Telephone Encounter (Signed)
Approved: 30 x 0 

## 2014-11-10 DIAGNOSIS — N2581 Secondary hyperparathyroidism of renal origin: Secondary | ICD-10-CM | POA: Diagnosis not present

## 2014-11-10 DIAGNOSIS — E1122 Type 2 diabetes mellitus with diabetic chronic kidney disease: Secondary | ICD-10-CM | POA: Diagnosis not present

## 2014-11-10 DIAGNOSIS — N183 Chronic kidney disease, stage 3 (moderate): Secondary | ICD-10-CM | POA: Diagnosis not present

## 2014-11-10 DIAGNOSIS — I129 Hypertensive chronic kidney disease with stage 1 through stage 4 chronic kidney disease, or unspecified chronic kidney disease: Secondary | ICD-10-CM | POA: Diagnosis not present

## 2014-11-10 DIAGNOSIS — R809 Proteinuria, unspecified: Secondary | ICD-10-CM | POA: Diagnosis not present

## 2014-11-14 DIAGNOSIS — L851 Acquired keratosis [keratoderma] palmaris et plantaris: Secondary | ICD-10-CM | POA: Diagnosis not present

## 2014-11-14 DIAGNOSIS — B351 Tinea unguium: Secondary | ICD-10-CM | POA: Diagnosis not present

## 2014-11-14 DIAGNOSIS — E1142 Type 2 diabetes mellitus with diabetic polyneuropathy: Secondary | ICD-10-CM | POA: Diagnosis not present

## 2014-11-20 ENCOUNTER — Other Ambulatory Visit: Payer: Self-pay | Admitting: Internal Medicine

## 2014-11-23 ENCOUNTER — Other Ambulatory Visit: Payer: Self-pay | Admitting: Internal Medicine

## 2014-11-24 NOTE — Telephone Encounter (Signed)
Approved: okay #[id number] x 0 

## 2014-11-24 NOTE — Telephone Encounter (Signed)
rx called into pharmacy

## 2014-11-24 NOTE — Telephone Encounter (Signed)
10/26/14 

## 2014-12-22 ENCOUNTER — Other Ambulatory Visit: Payer: Self-pay | Admitting: Internal Medicine

## 2014-12-25 NOTE — Telephone Encounter (Signed)
08/25/14 

## 2014-12-25 NOTE — Telephone Encounter (Signed)
rx called into pharmacy

## 2014-12-25 NOTE — Telephone Encounter (Signed)
Approved: okay #[id number] x 0 

## 2014-12-26 DIAGNOSIS — J449 Chronic obstructive pulmonary disease, unspecified: Secondary | ICD-10-CM | POA: Diagnosis not present

## 2014-12-26 DIAGNOSIS — R0602 Shortness of breath: Secondary | ICD-10-CM | POA: Diagnosis not present

## 2015-01-17 DIAGNOSIS — R0602 Shortness of breath: Secondary | ICD-10-CM | POA: Diagnosis not present

## 2015-01-21 ENCOUNTER — Other Ambulatory Visit: Payer: Self-pay | Admitting: Internal Medicine

## 2015-01-22 NOTE — Telephone Encounter (Signed)
rx called into pharmacy

## 2015-01-22 NOTE — Telephone Encounter (Signed)
Approved: okay #[id number] x 0 

## 2015-01-22 NOTE — Telephone Encounter (Signed)
12/25/2014 

## 2015-02-07 ENCOUNTER — Ambulatory Visit (INDEPENDENT_AMBULATORY_CARE_PROVIDER_SITE_OTHER): Payer: Medicare Other | Admitting: Internal Medicine

## 2015-02-07 ENCOUNTER — Encounter: Payer: Self-pay | Admitting: Internal Medicine

## 2015-02-07 VITALS — BP 108/60 | HR 67 | Temp 98.3°F | Wt 191.0 lb

## 2015-02-07 DIAGNOSIS — N058 Unspecified nephritic syndrome with other morphologic changes: Secondary | ICD-10-CM | POA: Diagnosis not present

## 2015-02-07 DIAGNOSIS — E1142 Type 2 diabetes mellitus with diabetic polyneuropathy: Secondary | ICD-10-CM | POA: Diagnosis not present

## 2015-02-07 DIAGNOSIS — I10 Essential (primary) hypertension: Secondary | ICD-10-CM

## 2015-02-07 DIAGNOSIS — Z23 Encounter for immunization: Secondary | ICD-10-CM | POA: Diagnosis not present

## 2015-02-07 DIAGNOSIS — N183 Chronic kidney disease, stage 3 unspecified: Secondary | ICD-10-CM

## 2015-02-07 DIAGNOSIS — E1129 Type 2 diabetes mellitus with other diabetic kidney complication: Secondary | ICD-10-CM | POA: Diagnosis not present

## 2015-02-07 DIAGNOSIS — J439 Emphysema, unspecified: Secondary | ICD-10-CM | POA: Diagnosis not present

## 2015-02-07 LAB — RENAL FUNCTION PANEL
Albumin: 4 g/dL (ref 3.5–5.2)
BUN: 35 mg/dL — ABNORMAL HIGH (ref 6–23)
CALCIUM: 9.4 mg/dL (ref 8.4–10.5)
CO2: 32 mEq/L (ref 19–32)
Chloride: 106 mEq/L (ref 96–112)
Creatinine, Ser: 1.6 mg/dL — ABNORMAL HIGH (ref 0.40–1.50)
GFR: 43.9 mL/min — AB (ref >60.00)
GLUCOSE: 126 mg/dL — AB (ref 70–99)
PHOSPHORUS: 2.2 mg/dL — AB (ref 2.3–4.6)
POTASSIUM: 4.1 meq/L (ref 3.5–5.1)
Sodium: 145 mEq/L (ref 135–145)

## 2015-02-07 LAB — HEMOGLOBIN A1C: Hgb A1c MFr Bld: 7.3 % — ABNORMAL HIGH (ref 4.6–6.5)

## 2015-02-07 NOTE — Assessment & Plan Note (Signed)
Mostly sensory Rare pain No rx for now

## 2015-02-07 NOTE — Assessment & Plan Note (Signed)
On ACEI  Will recheck labs 

## 2015-02-07 NOTE — Assessment & Plan Note (Signed)
BP Readings from Last 3 Encounters:  02/07/15 108/60  08/07/14 110/60  03/02/14 120/60   Good control

## 2015-02-07 NOTE — Assessment & Plan Note (Signed)
Hopefully still acceptable control Gets high fingersticks but A1c okay No changes

## 2015-02-07 NOTE — Assessment & Plan Note (Signed)
Stable status No changes needed

## 2015-02-07 NOTE — Addendum Note (Signed)
Addended by: Despina Hidden on: 02/07/2015 02:20 PM   Modules accepted: Orders

## 2015-02-07 NOTE — Progress Notes (Signed)
Pre visit review using our clinic review tool, if applicable. No additional management support is needed unless otherwise documented below in the visit note. 

## 2015-02-07 NOTE — Progress Notes (Signed)
Subjective:    Patient ID: Miguel Huber, male    DOB: Sep 12, 1927, 81 y.o.   MRN: [id number]  HPI Here for follow up of diabetes and chronic medical conditions  He is feeling fine No problems with diabetes Checks bid--- usually 150-200 in AM, more variable in evening No recent hypoglycemic spells Feet are numb--rare knife like pain but not very often  Breathing is okay Stable DOE Walks with walker Uses oxygen all the time Cough is less frequent--but still regular (always after breathing treatment)  No chest pain No palpitations No dizziness or syncope ?mild edema at most  No heartburn or stomach trouble No mental status changes or nausea  Current Outpatient Prescriptions on File Prior to Visit  Medication Sig Dispense Refill  . allopurinol (ZYLOPRIM) 100 MG tablet Take 100 mg by mouth daily.     . CRESTOR 20 MG tablet TAKE 1 TABLET (20 MG TOTAL) BY MOUTH DAILY. 90 tablet 1  . furosemide (LASIX) 80 MG tablet Take 1 tablet (80 mg total) by mouth daily. 90 tablet 3  . insulin NPH Human (NOVOLIN N) 100 UNIT/ML injection Inject 0.25 mLs (25 Units total) into the skin 2 (two) times daily before a meal. Dx: E11.29 5 vial 3  . insulin regular (NOVOLIN R) 100 units/mL injection Inject 0.1 mLs (10 Units total) into the skin 2 (two) times daily before a meal. Dx: E11.40 10 mL 11  . Insulin Syringe-Needle U-100 (B-D INS SYRINGE 0.5CC/30GX1/2") 30G X 1/2" 0.5 ML MISC Use as directed to inject insulin dx: 250.00 100 each 3  . ipratropium-albuterol (DUONEB) 0.5-2.5 (3) MG/3ML SOLN Take 3 mLs by nebulization 4 (four) times daily. Dx: J44.9 360 mL 3  . lisinopril (PRINIVIL,ZESTRIL) 10 MG tablet TAKE 1 TABLET BY MOUTH EVERY DAY 90 tablet 3  . omeprazole (PRILOSEC) 20 MG capsule TAKE 1 CAPSULE (20 MG TOTAL) BY MOUTH DAILY. 90 capsule 3  . polyethylene glycol (MIRALAX / GLYCOLAX) packet Take 17 g by mouth daily.    . temazepam (RESTORIL) 30 MG capsule TAKE ONE CAPSULE BY MOUTH AT BEDTIME 30  capsule 0   No current facility-administered medications on file prior to visit.    Allergies  Allergen Reactions  . Theophyllines     Past Medical History  Diagnosis Date  . Allergic rhinitis   . Hx of adenomatous colonic polyps   . Pulmonary fibrosis   . COPD (chronic obstructive pulmonary disease)   . GERD (gastroesophageal reflux disease)   . Erectile dysfunction   . Cardiomyopathy   . Hyperlipidemia   . Renal insufficiency   . Osteoarthritis   . Diabetes mellitus   . Hypertension   . DVT (deep venous thrombosis), right 5/13    Past Surgical History  Procedure Laterality Date  . Orif clavicle fracture      Left clavicle fracture- child  . Orif ulnar fracture      Left readius/ ulna fracture child  . Lung biopsy      Right lung bx. negative 2001  . Cataract extraction      Cataracts/ IOL OU, [place] 2005, [place] ?  2000  . Cardiovascular stress test      Myoview stress negative EF 65-70% 08/04    Family History  Problem Relation Age of Onset  . Cancer Neg Hx     Social History   Social History  . Marital Status: Married    Spouse Name: N/A  . Number of Children: 3  . Years of  Education: N/A   Occupational History  . retired     IT consultant   Social History Main Topics  . Smoking status: Former Research scientist (life sciences)  . Smokeless tobacco: Never Used  . Alcohol Use: No  . Drug Use: No  . Sexual Activity: Not on file   Other Topics Concern  . Not on file   Social History Narrative   Has platonic lady lfriend since ~2008  [facility]. Currently in nursing home after stroke.      Has living will now   No health care POA--has 3 children   Would like to try resuscitation--would accept ventilator only temporarily   Might accept tube feeds if cognitively unaware   Review of Systems Still doesn't sleep well--even with the temazepam. 2 hours--then up for voiding, trouble getting back to sleep Appetite is fine Weight stable Still visits lady friend Stanton Kidney daily at  her SNF    Objective:   Physical Exam  Constitutional: He appears well-developed and well-nourished. No distress.  Neck: Normal range of motion. Neck supple. No thyromegaly present.  Cardiovascular: Normal rate, regular rhythm and normal heart sounds.   Faint pedal pulses  Pulmonary/Chest: Effort normal. No respiratory distress. He has no wheezes. He has no rales.  Decreased breath sounds but clear Not tight  Musculoskeletal:  Trace edema Venous congestion in feet--no ulcers  Lymphadenopathy:    He has no cervical adenopathy.  Psychiatric: He has a normal mood and affect. His behavior is normal.          Assessment & Plan:

## 2015-02-08 ENCOUNTER — Encounter: Payer: Self-pay | Admitting: *Deleted

## 2015-02-19 DIAGNOSIS — L851 Acquired keratosis [keratoderma] palmaris et plantaris: Secondary | ICD-10-CM | POA: Diagnosis not present

## 2015-02-19 DIAGNOSIS — B351 Tinea unguium: Secondary | ICD-10-CM | POA: Diagnosis not present

## 2015-02-19 DIAGNOSIS — E1142 Type 2 diabetes mellitus with diabetic polyneuropathy: Secondary | ICD-10-CM | POA: Diagnosis not present

## 2015-02-22 ENCOUNTER — Other Ambulatory Visit: Payer: Self-pay | Admitting: Internal Medicine

## 2015-02-23 NOTE — Telephone Encounter (Signed)
rx called into pharmacy

## 2015-02-23 NOTE — Telephone Encounter (Signed)
Approved: 30 x 0 

## 2015-02-23 NOTE — Telephone Encounter (Signed)
01/22/2015 

## 2015-02-26 DIAGNOSIS — X32XXXA Exposure to sunlight, initial encounter: Secondary | ICD-10-CM | POA: Diagnosis not present

## 2015-02-26 DIAGNOSIS — Z85828 Personal history of other malignant neoplasm of skin: Secondary | ICD-10-CM | POA: Diagnosis not present

## 2015-02-26 DIAGNOSIS — L570 Actinic keratosis: Secondary | ICD-10-CM | POA: Diagnosis not present

## 2015-02-26 DIAGNOSIS — L309 Dermatitis, unspecified: Secondary | ICD-10-CM | POA: Diagnosis not present

## 2015-03-07 ENCOUNTER — Other Ambulatory Visit: Payer: Self-pay | Admitting: Internal Medicine

## 2015-03-09 ENCOUNTER — Other Ambulatory Visit: Payer: Self-pay | Admitting: Internal Medicine

## 2015-03-15 ENCOUNTER — Other Ambulatory Visit: Payer: Self-pay | Admitting: *Deleted

## 2015-03-15 MED ORDER — IPRATROPIUM-ALBUTEROL 0.5-2.5 (3) MG/3ML IN SOLN: 3.0000 mL | Freq: Four times a day (QID) | RESPIRATORY_TRACT | Status: DC | PRN

## 2015-03-24 ENCOUNTER — Other Ambulatory Visit: Payer: Self-pay | Admitting: Internal Medicine

## 2015-03-26 NOTE — Telephone Encounter (Signed)
Approved: 30 x 0 

## 2015-03-26 NOTE — Telephone Encounter (Signed)
rx called into pharmacy

## 2015-03-26 NOTE — Telephone Encounter (Signed)
02/23/2015 

## 2015-03-27 DIAGNOSIS — J449 Chronic obstructive pulmonary disease, unspecified: Secondary | ICD-10-CM | POA: Diagnosis not present

## 2015-03-27 DIAGNOSIS — J9611 Chronic respiratory failure with hypoxia: Secondary | ICD-10-CM | POA: Diagnosis not present

## 2015-03-27 DIAGNOSIS — J431 Panlobular emphysema: Secondary | ICD-10-CM | POA: Diagnosis not present

## 2015-04-19 ENCOUNTER — Other Ambulatory Visit: Payer: Self-pay | Admitting: Internal Medicine

## 2015-04-23 ENCOUNTER — Other Ambulatory Visit: Payer: Self-pay | Admitting: Internal Medicine

## 2015-04-24 NOTE — Telephone Encounter (Signed)
Last office visit 02/07/2015.  Last refilled 03/26/2015 for #30 with no refills.  Ok to refill?

## 2015-04-24 NOTE — Telephone Encounter (Signed)
Approved: 30 x 0 

## 2015-04-24 NOTE — Telephone Encounter (Signed)
[place] called into [facility]

## 2015-05-11 DIAGNOSIS — N2581 Secondary hyperparathyroidism of renal origin: Secondary | ICD-10-CM | POA: Diagnosis not present

## 2015-05-11 DIAGNOSIS — I129 Hypertensive chronic kidney disease with stage 1 through stage 4 chronic kidney disease, or unspecified chronic kidney disease: Secondary | ICD-10-CM | POA: Diagnosis not present

## 2015-05-11 DIAGNOSIS — R809 Proteinuria, unspecified: Secondary | ICD-10-CM | POA: Diagnosis not present

## 2015-05-11 DIAGNOSIS — N183 Chronic kidney disease, stage 3 (moderate): Secondary | ICD-10-CM | POA: Diagnosis not present

## 2015-05-23 ENCOUNTER — Other Ambulatory Visit: Payer: Self-pay | Admitting: Internal Medicine

## 2015-05-24 ENCOUNTER — Encounter: Payer: Self-pay | Admitting: Internal Medicine

## 2015-05-24 NOTE — Telephone Encounter (Signed)
04/24/2015 

## 2015-05-24 NOTE — Telephone Encounter (Signed)
Approved: 30 x 0 

## 2015-05-24 NOTE — Telephone Encounter (Signed)
rx called into pharmacy

## 2015-06-05 DIAGNOSIS — L851 Acquired keratosis [keratoderma] palmaris et plantaris: Secondary | ICD-10-CM | POA: Diagnosis not present

## 2015-06-05 DIAGNOSIS — B351 Tinea unguium: Secondary | ICD-10-CM | POA: Diagnosis not present

## 2015-06-05 DIAGNOSIS — E1142 Type 2 diabetes mellitus with diabetic polyneuropathy: Secondary | ICD-10-CM | POA: Diagnosis not present

## 2015-06-12 ENCOUNTER — Other Ambulatory Visit: Payer: Self-pay | Admitting: Internal Medicine

## 2015-06-13 NOTE — Telephone Encounter (Signed)
Pt was not home, his son answered his phone, will try again later.

## 2015-06-13 NOTE — Telephone Encounter (Signed)
05/24/15, too early?

## 2015-06-13 NOTE — Telephone Encounter (Signed)
Yes--too early Needs to wait till next week Make sure he is not taking more than prescribed

## 2015-06-14 ENCOUNTER — Emergency Department
Admission: EM | Admit: 2015-06-14 | Discharge: 2015-06-14 | Disposition: A | Discharge status: Home / Self Care | Payer: Medicare Other | Attending: Emergency Medicine | Admitting: Emergency Medicine

## 2015-06-14 ENCOUNTER — Emergency Department: Payer: Medicare Other

## 2015-06-14 DIAGNOSIS — Z794 Long term (current) use of insulin: Secondary | ICD-10-CM | POA: Diagnosis not present

## 2015-06-14 DIAGNOSIS — R112 Nausea with vomiting, unspecified: Secondary | ICD-10-CM | POA: Insufficient documentation

## 2015-06-14 DIAGNOSIS — R531 Weakness: Secondary | ICD-10-CM | POA: Diagnosis not present

## 2015-06-14 DIAGNOSIS — N183 Chronic kidney disease, stage 3 (moderate): Secondary | ICD-10-CM | POA: Diagnosis not present

## 2015-06-14 DIAGNOSIS — I129 Hypertensive chronic kidney disease with stage 1 through stage 4 chronic kidney disease, or unspecified chronic kidney disease: Secondary | ICD-10-CM | POA: Insufficient documentation

## 2015-06-14 DIAGNOSIS — Z7982 Long term (current) use of aspirin: Secondary | ICD-10-CM | POA: Insufficient documentation

## 2015-06-14 DIAGNOSIS — J439 Emphysema, unspecified: Secondary | ICD-10-CM | POA: Diagnosis not present

## 2015-06-14 DIAGNOSIS — Z87891 Personal history of nicotine dependence: Secondary | ICD-10-CM | POA: Insufficient documentation

## 2015-06-14 DIAGNOSIS — R42 Dizziness and giddiness: Secondary | ICD-10-CM | POA: Insufficient documentation

## 2015-06-14 DIAGNOSIS — Z79899 Other long term (current) drug therapy: Secondary | ICD-10-CM | POA: Insufficient documentation

## 2015-06-14 DIAGNOSIS — J449 Chronic obstructive pulmonary disease, unspecified: Secondary | ICD-10-CM | POA: Diagnosis not present

## 2015-06-14 DIAGNOSIS — Z9981 Dependence on supplemental oxygen: Secondary | ICD-10-CM | POA: Insufficient documentation

## 2015-06-14 DIAGNOSIS — E1342 Other specified diabetes mellitus with diabetic polyneuropathy: Secondary | ICD-10-CM | POA: Diagnosis not present

## 2015-06-14 DIAGNOSIS — Z743 Need for continuous supervision: Secondary | ICD-10-CM | POA: Diagnosis not present

## 2015-06-14 LAB — URINALYSIS COMPLETE WITH MICROSCOPIC (ARMC ONLY)
Bacteria, UA: NONE SEEN
Bilirubin Urine: NEGATIVE
Glucose, UA: 50 mg/dL — AB
HGB URINE DIPSTICK: NEGATIVE
KETONES UR: NEGATIVE mg/dL
LEUKOCYTES UA: NEGATIVE
Nitrite: NEGATIVE
PH: 7 (ref 5.0–8.0)
Protein, ur: NEGATIVE mg/dL
RBC / HPF: NONE SEEN RBC/hpf (ref 0–5)
SPECIFIC GRAVITY, URINE: 1.005 (ref 1.005–1.030)
Squamous Epithelial / LPF: NONE SEEN

## 2015-06-14 LAB — COMPREHENSIVE METABOLIC PANEL
ALK PHOS: 90 U/L (ref 38–126)
ALT: 22 U/L (ref 17–63)
ANION GAP: 10 (ref 5–15)
AST: 21 U/L (ref 15–41)
Albumin: 3.6 g/dL (ref 3.5–5.0)
BUN: 40 mg/dL — ABNORMAL HIGH (ref 6–20)
CALCIUM: 8.1 mg/dL — AB (ref 8.9–10.3)
CHLORIDE: 105 mmol/L (ref 101–111)
CO2: 27 mmol/L (ref 22–32)
Creatinine, Ser: 1.69 mg/dL — ABNORMAL HIGH (ref 0.61–1.24)
GFR calc non Af Amer: 35 mL/min — ABNORMAL LOW (ref >60)
GFR, EST AFRICAN AMERICAN: 41 mL/min — AB (ref >60)
Glucose, Bld: 210 mg/dL — ABNORMAL HIGH (ref 65–99)
Potassium: 3.3 mmol/L — ABNORMAL LOW (ref 3.5–5.1)
SODIUM: 142 mmol/L (ref 135–145)
Total Bilirubin: 1 mg/dL (ref 0.3–1.2)
Total Protein: 6.7 g/dL (ref 6.5–8.1)

## 2015-06-14 LAB — LIPASE, BLOOD: Lipase: 21 U/L (ref 11–51)

## 2015-06-14 LAB — CBC
HCT: 45.7 % (ref 40.0–52.0)
Hemoglobin: 15.2 g/dL (ref 13.0–18.0)
MCH: 32 pg (ref 26.0–34.0)
MCHC: 33.2 g/dL (ref 32.0–36.0)
MCV: 96.4 fL (ref 80.0–100.0)
Platelets: 184 10*3/uL (ref 150–440)
RBC: 4.74 MIL/uL (ref 4.40–5.90)
RDW: 13.5 % (ref 11.5–14.5)
WBC: 9.2 10*3/uL (ref 3.8–10.6)

## 2015-06-14 MED ORDER — ONDANSETRON HCL 4 MG PO TABS: 4.0000 mg | ORAL_TABLET | Freq: Every day | ORAL | Status: DC | PRN

## 2015-06-14 MED ORDER — SODIUM CHLORIDE 0.9 % IV BOLUS (SEPSIS)
1000.0000 mL | Freq: Once | INTRAVENOUS | Status: AC
  Administered 2015-06-14: 1000 mL via INTRAVENOUS

## 2015-06-14 MED ORDER — ONDANSETRON HCL 4 MG/2ML IJ SOLN
4.0000 mg | Freq: Once | INTRAMUSCULAR | Status: AC
  Administered 2015-06-14: 4 mg via INTRAVENOUS
  Filled 2015-06-14: qty 2

## 2015-06-14 NOTE — Discharge Instructions (Signed)

## 2015-06-14 NOTE — ED Notes (Signed)
Pt given drink. Pt able to tolerate fluids. Reports no more nausea.

## 2015-06-14 NOTE — ED Notes (Signed)
Pt ambulated with walker. Pt is steady and reports no weakness.

## 2015-06-14 NOTE — ED Provider Notes (Addendum)
[facility] Emergency Department Provider Note  ____________________________________________   I have reviewed the triage vital signs and the nursing notes.   HISTORY  Chief Complaint Nausea and Weakness    HPI Miguel Huber is a 82 y.o. male with multiple medical problems including CHF, COPD, hypertension, diabetes, insulin dependent, hyperlipidemia, cardiomyopathy, pulmonary fibrosis on 2 L of home oxygen, renal insufficiency who presents today complaining of vomiting and feeling lightheaded. He states he felt pretty good yesterday. This morning he got up and felt lightheaded. Is very poorly described. It certainly is possible there is some element of vertigo but the patient's description of it is not sufficient to elaborate this detail. In any event, he felt some variety of lightheadedness and went to the bathroom and vomited and has vomited a total of 2 times today. Nonbloody nonbilious. No melena no bright red blood per rectum. The patient has had no abdominal pain chest pain and denies any significant increase in his baseline shortness of breath. He states he does feel generally weak however. He did not eat out last night. His son had the same vomiting and diarrheal illness last night going into today. He himself has not had diarrhea.  Past Medical History  Diagnosis Date  . Allergic rhinitis   . Hx of adenomatous colonic polyps   . Pulmonary fibrosis ([place])   . COPD (chronic obstructive pulmonary disease) ([place])   . GERD (gastroesophageal reflux disease)   . Erectile dysfunction   . Cardiomyopathy   . Hyperlipidemia   . Renal insufficiency   . Osteoarthritis   . Diabetes mellitus   . Hypertension   . DVT (deep venous thrombosis), right 5/13    Patient Active Problem List   Diagnosis Date Noted  . Routine general medical examination at a health care facility 08/02/2013  . Type 2 diabetes mellitus with neurological manifestations, controlled ([place])  08/02/2013  . Diabetes, polyneuropathy ([place]) 08/02/2013  . COPD with exacerbation ([place]) 03/22/2012  . Constipation, chronic 10/10/2011  . Type 2 diabetes, controlled, with renal manifestation ([place]) 06/06/2011  . Hypertension   . UNSPECIFIED VENOUS INSUFFICIENCY 08/14/2010  . OSTEOARTHRITIS 04/09/2010  . BACK PAIN, LUMBAR, WITH RADICULOPATHY 06/01/2008  . Chronic kidney disease, stage III (moderate) 04/20/2007  . Hyperlipemia 10/13/2006  . ALLERGIC RHINITIS 10/13/2006  . COPD (chronic obstructive pulmonary disease) with emphysema ([place]) 10/13/2006  . GERD 10/13/2006    Past Surgical History  Procedure Laterality Date  . Orif clavicle fracture      Left clavicle fracture- child  . Orif ulnar fracture      Left readius/ ulna fracture child  . Lung biopsy      Right lung bx. negative 2001  . Cataract extraction      Cataracts/ IOL OU, Epes 2005, [place] ?  2000  . Cardiovascular stress test      Myoview stress negative EF 65-70% 08/04    Current Outpatient Rx  Name  Route  Sig  Dispense  Refill  . allopurinol (ZYLOPRIM) 100 MG tablet   Oral   Take 100 mg by mouth daily.          Marland Kitchen aspirin 81 MG tablet   Oral   Take 81 mg by mouth daily.         Marland Kitchen BREO ELLIPTA 100-25 MCG/INH AEPB      INHALE 1 PUFF BY MOUTH DAILY      2     Dispense as written.   . furosemide (LASIX) 80  MG tablet      TAKE 1 TABLET BY MOUTH EVERY DAY   90 tablet   3   . insulin NPH Human (NOVOLIN N) 100 UNIT/ML injection   Subcutaneous   Inject 0.25 mLs (25 Units total) into the skin 2 (two) times daily before a meal. Dx: E11.29   5 vial   3   . insulin regular (NOVOLIN R) 100 units/mL injection   Subcutaneous   Inject 0.1 mLs (10 Units total) into the skin 2 (two) times daily before a meal. Dx: E11.40   10 mL   11   . ipratropium-albuterol (DUONEB) 0.5-2.5 (3) MG/3ML SOLN   Inhalation   Inhale 3 mLs into the lungs 4 (four) times daily as needed. Dx: J43.9   360 mL   3   .  lisinopril (PRINIVIL,ZESTRIL) 10 MG tablet   Oral   Take 10 mg by mouth daily.      3   . omeprazole (PRILOSEC) 20 MG capsule      TAKE 1 CAPSULE (20 MG TOTAL) BY MOUTH DAILY.   90 capsule   3   . polyethylene glycol (MIRALAX / GLYCOLAX) packet   Oral   Take 17 g by mouth daily.         . rosuvastatin (CRESTOR) 20 MG tablet      TAKE 1 TABLET (20 MG TOTAL) BY MOUTH DAILY.   90 tablet   1   . temazepam (RESTORIL) 30 MG capsule      TAKE ONE CAPSULE BY MOUTH AT BEDTIME   30 capsule   0     Not to exceed 5 additional fills before 10/21/2015 ...     Allergies Theophyllines  Family History  Problem Relation Age of Onset  . Cancer Neg Hx     Social History Social History  Substance Use Topics  . Smoking status: Former Research scientist (life sciences)  . Smokeless tobacco: Never Used  . Alcohol Use: No    Review of Systems Constitutional: No fever/chills Eyes: No visual changes. ENT: No sore throat. No stiff neck no neck pain Cardiovascular: Denies chest pain. Respiratory: Denies shortness of breath. Gastrointestinal:   Positive vomiting.  No diarrhea.  No constipation. Genitourinary: Negative for dysuria. Musculoskeletal: Negative lower extremity swelling Skin: Negative for rash. Neurological: Negative for headaches, focal weakness or numbness. 10-point ROS otherwise negative.  ____________________________________________   PHYSICAL EXAM:  VITAL SIGNS: ED Triage Vitals  Enc Vitals Group     BP 06/14/15 1109 118/60 mmHg     Pulse Rate 06/14/15 1109 70     Resp 06/14/15 1130 17     Temp 06/14/15 1109 97.9 F (36.6 C)     Temp Source 06/14/15 1109 Oral     SpO2 06/14/15 1109 93 %     Weight 06/14/15 1109 190 lb (86.183 kg)     Height 06/14/15 1109 5\' 8"  (1.727 m)     Head Cir --      Peak Flow --      Pain Score --      Pain Loc --      Pain Edu? --      Excl. in [place]? --     Constitutional: Alert and oriented. Chronic ill-appearing gentleman but no acute medical  distress this time speaks in full sentences with no evidence of significant discomfort Eyes: Conjunctivae are normal. PERRL. EOMI. Head: Atraumatic. Nose: No congestion/rhinnorhea. Mouth/Throat: Mucous membranes are moist.  Oropharynx non-erythematous. Neck: No stridor.   Nontender with  no meningismus Cardiovascular: Normal rate, regular rhythm. Grossly normal heart sounds.  Good peripheral circulation. Respiratory: Normal respiratory effort.  No retractions. Eyes are diminished in the bases but there is no wheezes rales or rhonchi. Abdominal: Soft and nontender. No distention. No guarding no rebound Back:  There is no focal tenderness or step off there is no midline tenderness there are no lesions noted. there is no CVA tenderness Musculoskeletal: No lower extremity tenderness. No joint effusions, no DVT signs strong distal pulses no edema Neurologic:  Normal speech and language. No gross focal neurologic deficits are appreciated. Finger to nose within normal limits no obvious cerebellar signs renal nerves are intact Skin:  Skin is warm, dry and intact. No rash noted. Psychiatric: Mood and affect are normal. Speech and behavior are normal.  ____________________________________________   LABS (all labs ordered are listed, but only abnormal results are displayed)  Labs Reviewed  COMPREHENSIVE METABOLIC PANEL - Abnormal; Notable for the following:    Potassium 3.3 (*)    Glucose, Bld 210 (*)    BUN 40 (*)    Creatinine, Ser 1.69 (*)    Calcium 8.1 (*)    GFR calc non Af Amer 35 (*)    GFR calc Af Amer 41 (*)    All other components within normal limits  URINALYSIS COMPLETEWITH MICROSCOPIC ([facility] ONLY) - Abnormal; Notable for the following:    Color, Urine COLORLESS (*)    APPearance CLEAR (*)    Glucose, UA 50 (*)    All other components within normal limits  LIPASE, BLOOD  CBC   ____________________________________________  EKG  I personally interpreted any EKGs ordered by me  or triage Sinus rhythm Atrial premature complex Low voltage, precordial leads Rate 66 ____________________________________________  RADIOLOGY  I reviewed any imaging ordered by me or triage that were performed during my shift ____________________________________________   PROCEDURES  Procedure(s) performed: None  Critical Care performed: None  ____________________________________________   INITIAL IMPRESSION / ASSESSMENT AND PLAN / ED COURSE  Pertinent labs & imaging results that were available during my care of the patient were reviewed by me and considered in my medical decision making (see chart for details).  Patient had vomiting and generalized weakness today. Shortly, patient is at baseline a foot with multiple medical problems which makes it difficult for him to tolerate this kind of insult to his system. His abdomen is benign at this time there is no evidence of intra-abdominal pathology. He has no chest pain and no significant shortness of breath I do not think this represents cardiogenic emesis. His EKG is reassuring. We will obtain a CT scan of his head although I have low suspicion for CVA, cerebellar are otherwise, given his exam. Patient has received nausea medications he does not feel nauseated at this time. Triage note indicates that some of the family states that they seem to think that he has been getting slightly worse over the last 2 weeks but he himself denies this. He states this all began this morning and the son who is in the room, collaborates this. We will monitor the patient closely and reevaluate after imaging. I will also obtain a chest x-ray.Marland Kitchen   ----------------------------------------- 2:50 PM on 06/14/2015 -----------------------------------------  Serial neurologic exams are normal, patient has no headache no nausea he is tolerating by mouth no difficulty hearing he feels much better. He is requesting discharge. I will signs are reassuring blood work  is reassuring imaging is reassuring. No evidence of acute CVA, no  evidence of head bleed. No evidence of cerebellar infarct. Patient and family are requesting discharge at this time.  ----------------------------------------- 3:18 PM on 06/14/2015 -----------------------------------------  Workup is very reassuring patient has no chest pain shows breath nausea or vomiting at this time no evidence of acute CVA CT is reassuring we will discharge him home. Patient has mild tachypnea but this is his baseline he states he does not feel any shortness of breath is sats are fine on his home 2 L. He declines any further intervention from [place] we'll and bili him to ensure that he is not otherwise compromised and we'll try to discharge him home   ____________________________________________   FINAL CLINICAL IMPRESSION(S) / ED DIAGNOSES  Final diagnoses:  None     Schuyler Amor, MD 06/14/15 [place], MD 06/14/15 [place], MD 06/14/15 [place], MD 06/26/15 2220

## 2015-06-14 NOTE — ED Notes (Signed)
Pt came to ED via EMS c/o nausea, vomiting, weakness. Pts family reports that pt has been sick for the last two weeks. When asking patient, he reports that it started this morning.

## 2015-06-18 ENCOUNTER — Telehealth: Payer: Self-pay | Admitting: *Deleted

## 2015-06-18 NOTE — Telephone Encounter (Signed)
Home number just rang, no answering machine, cell number is not accepting calls at this time. Will call back later to see how patient is doing after ER visit.

## 2015-06-25 NOTE — Telephone Encounter (Signed)
Again no answer, no answering machine.

## 2015-06-26 DIAGNOSIS — J449 Chronic obstructive pulmonary disease, unspecified: Secondary | ICD-10-CM | POA: Diagnosis not present

## 2015-06-26 DIAGNOSIS — J9611 Chronic respiratory failure with hypoxia: Secondary | ICD-10-CM | POA: Diagnosis not present

## 2015-06-28 MED ORDER — TEMAZEPAM 30 MG PO CAPS: 30.0000 mg | ORAL_CAPSULE | Freq: Every day | ORAL | Status: DC

## 2015-06-28 NOTE — Telephone Encounter (Signed)
Approved: 30 x 0 

## 2015-06-28 NOTE — Telephone Encounter (Signed)
rx called into pharmacy

## 2015-06-28 NOTE — Telephone Encounter (Signed)
Last filled 05/24/2015

## 2015-06-28 NOTE — Addendum Note (Signed)
Addended by: Despina Hidden on: 06/28/2015 02:54 PM   Modules accepted: Orders

## 2015-06-28 NOTE — Telephone Encounter (Signed)
Pt left v/m requesting cb about temazepam refill.

## 2015-06-28 NOTE — Addendum Note (Signed)
Addended by: Despina Hidden on: 06/28/2015 11:02 AM   Modules accepted: Orders

## 2015-07-13 ENCOUNTER — Other Ambulatory Visit: Payer: Self-pay | Admitting: Internal Medicine

## 2015-07-16 NOTE — Telephone Encounter (Signed)
06/28/2015 

## 2015-07-16 NOTE — Telephone Encounter (Signed)
Too soon again Not due till next week

## 2015-07-16 NOTE — Telephone Encounter (Signed)
Denied rx sent back to the pharmacy

## 2015-07-23 ENCOUNTER — Other Ambulatory Visit: Payer: Self-pay | Admitting: Internal Medicine

## 2015-07-29 ENCOUNTER — Other Ambulatory Visit: Payer: Self-pay | Admitting: Internal Medicine

## 2015-07-30 NOTE — Telephone Encounter (Signed)
Refill left on voice mail at pharmacy

## 2015-07-30 NOTE — Telephone Encounter (Signed)
Last filled 06-28-15 #30/0 Last OV 02-07-15 Next OV 08-20-15

## 2015-07-30 NOTE — Telephone Encounter (Signed)
Approved: 30 x 0 

## 2015-07-30 NOTE — Telephone Encounter (Signed)
Pt wanted to ck on status of temazepam refill; advised pt already sent to pharmacy; pt voiced understanding.

## 2015-08-06 ENCOUNTER — Encounter: Payer: Self-pay | Admitting: Emergency Medicine

## 2015-08-06 ENCOUNTER — Emergency Department
Admission: EM | Admit: 2015-08-06 | Discharge: 2015-08-06 | Disposition: A | Discharge status: Home / Self Care | Payer: Medicare Other | Attending: Emergency Medicine | Admitting: Emergency Medicine

## 2015-08-06 ENCOUNTER — Emergency Department: Payer: Medicare Other

## 2015-08-06 DIAGNOSIS — E1142 Type 2 diabetes mellitus with diabetic polyneuropathy: Secondary | ICD-10-CM | POA: Diagnosis not present

## 2015-08-06 DIAGNOSIS — Z7982 Long term (current) use of aspirin: Secondary | ICD-10-CM | POA: Insufficient documentation

## 2015-08-06 DIAGNOSIS — J410 Simple chronic bronchitis: Secondary | ICD-10-CM

## 2015-08-06 DIAGNOSIS — E1129 Type 2 diabetes mellitus with other diabetic kidney complication: Secondary | ICD-10-CM | POA: Diagnosis not present

## 2015-08-06 DIAGNOSIS — Z7951 Long term (current) use of inhaled steroids: Secondary | ICD-10-CM | POA: Insufficient documentation

## 2015-08-06 DIAGNOSIS — Z794 Long term (current) use of insulin: Secondary | ICD-10-CM | POA: Diagnosis not present

## 2015-08-06 DIAGNOSIS — Z87891 Personal history of nicotine dependence: Secondary | ICD-10-CM | POA: Diagnosis not present

## 2015-08-06 DIAGNOSIS — Z79899 Other long term (current) drug therapy: Secondary | ICD-10-CM | POA: Diagnosis not present

## 2015-08-06 DIAGNOSIS — I129 Hypertensive chronic kidney disease with stage 1 through stage 4 chronic kidney disease, or unspecified chronic kidney disease: Secondary | ICD-10-CM | POA: Insufficient documentation

## 2015-08-06 DIAGNOSIS — J439 Emphysema, unspecified: Secondary | ICD-10-CM | POA: Insufficient documentation

## 2015-08-06 DIAGNOSIS — N183 Chronic kidney disease, stage 3 (moderate): Secondary | ICD-10-CM | POA: Diagnosis not present

## 2015-08-06 DIAGNOSIS — R05 Cough: Secondary | ICD-10-CM | POA: Diagnosis not present

## 2015-08-06 DIAGNOSIS — R0602 Shortness of breath: Secondary | ICD-10-CM | POA: Diagnosis present

## 2015-08-06 LAB — BASIC METABOLIC PANEL
ANION GAP: 7 (ref 5–15)
Anion gap: 6 (ref 5–15)
BUN: 50 mg/dL — AB (ref 6–20)
BUN: 49 mg/dL — ABNORMAL HIGH (ref 6–20)
CALCIUM: 8.3 mg/dL — AB (ref 8.9–10.3)
CHLORIDE: 109 mmol/L (ref 101–111)
CHLORIDE: 108 mmol/L (ref 101–111)
CO2: 27 mmol/L (ref 22–32)
CO2: 30 mmol/L (ref 22–32)
CREATININE: 2.03 mg/dL — AB (ref 0.61–1.24)
Calcium: 8.4 mg/dL — ABNORMAL LOW (ref 8.9–10.3)
Creatinine, Ser: 1.92 mg/dL — ABNORMAL HIGH (ref 0.61–1.24)
GFR calc Af Amer: 33 mL/min — ABNORMAL LOW (ref >60)
GFR calc Af Amer: 35 mL/min — ABNORMAL LOW (ref >60)
GFR calc non Af Amer: 28 mL/min — ABNORMAL LOW (ref >60)
GFR, EST NON AFRICAN AMERICAN: 30 mL/min — AB (ref >60)
GLUCOSE: 51 mg/dL — AB (ref 65–99)
Glucose, Bld: 173 mg/dL — ABNORMAL HIGH (ref 65–99)
POTASSIUM: 3.9 mmol/L (ref 3.5–5.1)
Potassium: 4.3 mmol/L (ref 3.5–5.1)
SODIUM: 142 mmol/L (ref 135–145)
Sodium: 145 mmol/L (ref 135–145)

## 2015-08-06 LAB — TROPONIN I
Troponin I: 0.03 ng/mL (ref <0.031)
Troponin I: <0.03 ng/mL (ref <0.031)

## 2015-08-06 LAB — CBC
HEMATOCRIT: 43.5 % (ref 40.0–52.0)
HEMOGLOBIN: 14.8 g/dL (ref 13.0–18.0)
MCH: 32.1 pg (ref 26.0–34.0)
MCHC: 34 g/dL (ref 32.0–36.0)
MCV: 94.3 fL (ref 80.0–100.0)
Platelets: 155 10*3/uL (ref 150–440)
RBC: 4.61 MIL/uL (ref 4.40–5.90)
RDW: 13.2 % (ref 11.5–14.5)
WBC: 6.5 10*3/uL (ref 3.8–10.6)

## 2015-08-06 LAB — BRAIN NATRIURETIC PEPTIDE: B Natriuretic Peptide: 18 pg/mL (ref 0.0–100.0)

## 2015-08-06 LAB — LACTIC ACID, PLASMA: Lactic Acid, Venous: 0.8 mmol/L (ref 0.5–2.0)

## 2015-08-06 LAB — RAPID INFLUENZA A&B ANTIGENS (ARMC ONLY): INFLUENZA B (ARMC): NEGATIVE

## 2015-08-06 LAB — RAPID INFLUENZA A&B ANTIGENS: Influenza A (ARMC): NEGATIVE

## 2015-08-06 MED ORDER — SODIUM CHLORIDE 0.9 % IV SOLN
Freq: Once | INTRAVENOUS | Status: AC
  Administered 2015-08-06: 17:00:00 via INTRAVENOUS

## 2015-08-06 MED ORDER — SODIUM CHLORIDE 0.9 % IV SOLN
Freq: Once | INTRAVENOUS | Status: AC
  Administered 2015-08-06: 19:00:00 via INTRAVENOUS

## 2015-08-06 MED ORDER — AZITHROMYCIN 500 MG PO TABS
500.0000 mg | ORAL_TABLET | Freq: Once | ORAL | Status: AC
  Administered 2015-08-06: 500 mg via ORAL
  Filled 2015-08-06: qty 1

## 2015-08-06 NOTE — ED Notes (Signed)
Sandwich tray and diet soda given.pt comfortable in bed watching tv and eating

## 2015-08-06 NOTE — ED Notes (Signed)
Pt sent over from the drug store with low blood pressure and shortness of breath. Pt on continuous oxygen 2 liters. Pt switched over to hospital oxygen tank while waiting.

## 2015-08-06 NOTE — ED Notes (Signed)
Patient assisted to his car on our O2 by ED tech.

## 2015-08-06 NOTE — ED Notes (Signed)
Iv placed in pt's left ac due to poor venous options

## 2015-08-06 NOTE — ED Notes (Signed)
Patient was at drug store to pick up antibiotics and cough syrup. Patient was sent here by drug store staff for shortness of breath. Patient states he has nasal congestion, recurrent, harsh cough.

## 2015-08-06 NOTE — ED Notes (Signed)
1st liter is still infusing. Patient advised to straighten arm.

## 2015-08-06 NOTE — ED Provider Notes (Signed)
[facility] Emergency Department Provider Note  ____________________________________________  Time seen: Approximately 4:12 PM  I have reviewed the triage vital signs and the nursing notes.   HISTORY  Chief Complaint Hypotension    HPI Miguel Huber is a 82 y.o. male who reports he went to the drugstore to pick up to prescriptions and was sent here. He apparently was sent here for shortness of breath and a bad cough. He went to pick up prescription for antibiotics and cough medicine. Here his blood pressures was somewhat on the low side last one being 94 systolic right now is [id number]. Patient does not remember what his usual blood pressure runs. Patient says he feels fine he just wanted to pick up his 2 prescriptions. Patient does have a history of COPD cardiomyopathy congestive failure in the the list as noted in past medical history.  Past Medical History  Diagnosis Date  . Allergic rhinitis   . Hx of adenomatous colonic polyps   . Pulmonary fibrosis ([place])   . COPD (chronic obstructive pulmonary disease) ([place])   . GERD (gastroesophageal reflux disease)   . Erectile dysfunction   . Cardiomyopathy   . Hyperlipidemia   . Renal insufficiency   . Osteoarthritis   . Diabetes mellitus   . Hypertension   . DVT (deep venous thrombosis), right 5/13    Patient Active Problem List   Diagnosis Date Noted  . Routine general medical examination at a health care facility 08/02/2013  . Type 2 diabetes mellitus with neurological manifestations, controlled ([place]) 08/02/2013  . Diabetes, polyneuropathy ([place]) 08/02/2013  . COPD with exacerbation ([place]) 03/22/2012  . Constipation, chronic 10/10/2011  . Type 2 diabetes, controlled, with renal manifestation ([place]) 06/06/2011  . Hypertension   . UNSPECIFIED VENOUS INSUFFICIENCY 08/14/2010  . OSTEOARTHRITIS 04/09/2010  . BACK PAIN, LUMBAR, WITH RADICULOPATHY 06/01/2008  . Chronic kidney disease, stage III (moderate)  04/20/2007  . Hyperlipemia 10/13/2006  . ALLERGIC RHINITIS 10/13/2006  . COPD (chronic obstructive pulmonary disease) with emphysema ([place]) 10/13/2006  . GERD 10/13/2006    Past Surgical History  Procedure Laterality Date  . Orif clavicle fracture      Left clavicle fracture- child  . Orif ulnar fracture      Left readius/ ulna fracture child  . Lung biopsy      Right lung bx. negative 2001  . Cataract extraction      Cataracts/ IOL OU, Ep[place] 2005, [place] ?  2000  . Cardiovascular stress test      Myoview stress negative EF 65-70% 08/04    Current Outpatient Rx  Name  Route  Sig  Dispense  Refill  . allopurinol (ZYLOPRIM) 100 MG tablet   Oral   Take 100 mg by mouth daily.          Marland Kitchen aspirin EC 81 MG tablet   Oral   Take 81 mg by mouth daily.         . fluticasone furoate-vilanterol (BREO ELLIPTA) 100-25 MCG/INH AEPB   Inhalation   Inhale 1 puff into the lungs daily.         . furosemide (LASIX) 80 MG tablet   Oral   Take 80 mg by mouth daily.         . insulin NPH Human (HUMULIN N,NOVOLIN N) 100 UNIT/ML injection   Subcutaneous   Inject 25 Units into the skin 2 (two) times daily before a meal.         . insulin regular (NOVOLIN  R,HUMULIN R) 100 units/mL injection   Subcutaneous   Inject 10 Units into the skin 2 (two) times daily before a meal.         . ipratropium-albuterol (DUONEB) 0.5-2.5 (3) MG/3ML SOLN   Nebulization   Take 3 mLs by nebulization 4 (four) times daily as needed (for wheezing/shortness of breath).         Marland Kitchen lisinopril (PRINIVIL,ZESTRIL) 10 MG tablet   Oral   Take 10 mg by mouth daily.      3   . omeprazole (PRILOSEC) 20 MG capsule   Oral   Take 20 mg by mouth daily.         . polyethylene glycol (MIRALAX / GLYCOLAX) packet   Oral   Take 17 g by mouth daily.         . rosuvastatin (CRESTOR) 10 MG tablet   Oral   Take 10 mg by mouth at bedtime.         . temazepam (RESTORIL) 30 MG capsule   Oral   Take 30 mg by  mouth at bedtime.         . ondansetron (ZOFRAN) 4 MG tablet   Oral   Take 1 tablet (4 mg total) by mouth daily as needed for nausea or vomiting. Patient not taking: Reported on 08/06/2015   10 tablet   0     Allergies Theophyllines  Family History  Problem Relation Age of Onset  . Cancer Neg Hx     Social History Social History  Substance Use Topics  . Smoking status: Former Research scientist (life sciences)  . Smokeless tobacco: Never Used  . Alcohol Use: No    Review of Systems Constitutional: No fever/chills Eyes: No visual changes. ENT: No sore throat. Cardiovascular: Denies chest pain. Respiratory: Denies shortness of breath. Gastrointestinal: No abdominal pain.  No nausea, no vomiting.  No diarrhea.  No constipation. Genitourinary: Negative for dysuria. Musculoskeletal: Negative for back pain. Skin: Negative for rash. Neurological: Negative for headaches, focal weakness or numbness.  10-point ROS otherwise negative.  ____________________________________________   PHYSICAL EXAM:  VITAL SIGNS: ED Triage Vitals  Enc Vitals Group     BP 08/06/15 1344 103/61 mmHg     Pulse Rate 08/06/15 1344 94     Resp 08/06/15 1344 18     Temp 08/06/15 1344 98.1 F (36.7 C)     Temp Source 08/06/15 1344 Oral     SpO2 08/06/15 1344 91 %     Weight 08/06/15 1344 198 lb (89.812 kg)     Height 08/06/15 1344 5\' 8"  (1.727 m)     Head Cir --      Peak Flow --      Pain Score --      Pain Loc --      Pain Edu? --      Excl. in [place]? --     Constitutional: Alert and oriented. Well appearing and in no acute distress. Eyes: Conjunctivae are normal. PERRL. EOMI. Head: Atraumatic. Nose: No congestion/rhinnorhea. Mouth/Throat: Mucous membranes are moist.  Oropharynx non-erythematous. Neck: No stridor.  Cardiovascular: Normal rate, regular rhythm. Grossly normal heart sounds.  Good peripheral circulation. Respiratory: Normal respiratory effort.  No retractions. Lungs occasional scattered  crackles Gastrointestinal: Soft and nontender. No distention. No abdominal bruits. No CVA tenderness. Musculoskeletal: No lower extremity tenderness nor edema.  No joint effusions. Neurologic:  Normal speech and language. No gross focal neurologic deficits are appreciated. No gait instability. Skin:  Skin is warm, dry and  intact. No rash noted. Psychiatric: Mood and affect are normal. Speech and behavior are normal.  ____________________________________________   LABS (all labs ordered are listed, but only abnormal results are displayed)  Labs Reviewed  BASIC METABOLIC PANEL - Abnormal; Notable for the following:    Glucose, Bld 173 (*)    BUN 50 (*)    Creatinine, Ser 2.03 (*)    Calcium 8.3 (*)    GFR calc non Af Amer 28 (*)    GFR calc Af Amer 33 (*)    All other components within normal limits  BASIC METABOLIC PANEL - Abnormal; Notable for the following:    Glucose, Bld 51 (*)    BUN 49 (*)    Creatinine, Ser 1.92 (*)    Calcium 8.4 (*)    GFR calc non Af Amer 30 (*)    GFR calc Af Amer 35 (*)    All other components within normal limits  RAPID INFLUENZA A&B ANTIGENS ([facility] ONLY)  CBC  TROPONIN I  LACTIC ACID, PLASMA  BRAIN NATRIURETIC PEPTIDE  TROPONIN I   ____________________________________________  EKG  EKG read and interpreted by me shows normal sinus rhythm a rate of 91 left axis no acute ST-T wave changes ____________________________________________  RADIOLOGY  Chest x-ray read by radiology as stable interstitial fibrosis I reviewed the film ____________________________________________   PROCEDURES  After receiving 1 L fluid patient reports he continues to feel well. Patient was not really orthostatic by pulse or pressure. Review of old records from podiatry reveals multiple readings of blood pressures in the [id number] and 0000000 systolic. This appears to be the patient's normal blood pressure. I will let the patient go with the prescription for the cough medicine  and antibiotic that he wanted to begin with.  ____________________________________________   INITIAL IMPRESSION / [place] / ED COURSE  Pertinent labs & imaging results that were available during my care of the patient were reviewed by me and considered in my medical decision making (see chart for details).   ____________________________________________   FINAL CLINICAL IMPRESSION(S) / ED DIAGNOSES  Final diagnoses:  Simple chronic bronchitis ([place])      Nena Polio, MD 08/07/15 0100

## 2015-08-06 NOTE — ED Notes (Signed)
2nd infusion stopped per Dr. Sonny Dandy order.

## 2015-08-06 NOTE — ED Notes (Signed)
Patient states he is able to drive home without difficulty. Patient will be helped to the car via wheelchair.

## 2015-08-06 NOTE — Discharge Instructions (Signed)
Chronic Bronchitis Chronic bronchitis is a lasting inflammation of the bronchial tubes, which are the tubes that carry air into your lungs. This is inflammation that occurs:   On most days of the week.   For at least three months at a time.   Over a period of two years in a row. When the bronchial tubes are inflamed, they start to produce mucus. The inflammation and buildup of mucus make it more difficult to breathe. Chronic bronchitis is usually a permanent problem and is one type of chronic obstructive pulmonary disease (COPD). People with chronic bronchitis are at greater risk for getting repeated colds, or respiratory infections. CAUSES  Chronic bronchitis most often occurs in people who have:  Long-standing, severe asthma.  A history of smoking.  Asthma and who also smoke. SIGNS AND SYMPTOMS  Chronic bronchitis may cause the following:   A cough that brings up mucus (productive cough).  Shortness of breath.  Early morning headache.  Wheezing.  Chest discomfort.   Recurring respiratory infections. DIAGNOSIS  Your health care provider may confirm the diagnosis by:  Taking your medical history.  Performing a physical exam.  Taking a chest X-ray.   Performing pulmonary function tests. TREATMENT  Treatment involves controlling symptoms with medicines, oxygen therapy, or making lifestyle changes, such as exercising and eating a healthy, well-balanced diet. Medicines could include:  Inhalers to improve air flow in and out of your lungs.  Antibiotics to treat bacterial infections, such as pneumonia, sinus infections, and acute bronchitis. As a preventative measure, your health care provider may recommend routine vaccinations for influenza and pneumonia. This is to prevent infection and hospitalization since you may be more at risk for these types of infections.  HOME CARE INSTRUCTIONS  Take medicines only as directed by your health care provider.   If you smoke  cigarettes, chew tobacco, or use electronic cigarettes, quit. If you need help quitting, ask your health care provider.  Avoid pollen, dust, animal dander, molds, smoke, and other things that cause shortness of breath or wheezing attacks.  Talk to your health care provider about possible exercise routines. Regular exercise is very important to help you feel better.  If you are prescribed oxygen use at home follow these guidelines:  Never smoke while using oxygen. Oxygen does not burn or explode, but flammable materials will burn faster in the presence of oxygen.  Keep a Data processing manager close by. Let your fire department know that you have oxygen in your home.  Warn visitors not to smoke near you when you are using oxygen. Put up "no smoking" signs in your home where you most often use the oxygen.  Regularly test your smoke detectors at home to make sure they work. If you receive care in your home from a nurse or other health care provider, he or she may also check to make sure your smoke detectors work.  Ask your health care provider whether you would benefit from a pulmonary rehabilitation program.  Do not wait to get medical care if you have any concerning symptoms. Delays could cause permanent injury and may be life threatening. SEEK MEDICAL CARE IF:  You have increased coughing or shortness of breath or both.  You have muscle aches.  You have chest pain.  Your mucus gets thicker.  Your mucus changes from clear or white to yellow, green, gray, or bloody. SEEK IMMEDIATE MEDICAL CARE IF:  Your usual medicines do not stop your wheezing.   You have increased difficulty breathing.  You have any problems with the medicine you are taking, such as a rash, itching, swelling, or trouble breathing. MAKE SURE YOU:   Understand these instructions.  Will watch your condition.  Will get help right away if you are not doing well or get worse.   This information is not intended to  replace advice given to you by your health care provider. Make sure you discuss any questions you have with your health care provider.   Document Released: 02/27/2006 Document Revised: 06/02/2014 Document Reviewed: 06/20/2013 Elsevier Interactive Patient Education [employer].   Please return if you feel worse. Please follow-up with your doctor in the next few days. Use the cough medicine as directed and the Zithromax as directed.

## 2015-08-08 ENCOUNTER — Encounter: Payer: BLUE CROSS/BLUE SHIELD | Admitting: Internal Medicine

## 2015-08-09 ENCOUNTER — Telehealth: Payer: Self-pay

## 2015-08-09 NOTE — Telephone Encounter (Signed)
Called to see how he was doing after his ER visit for low blood pressure. Patient stated he was feeling a little better. He has an OV 08-20-15

## 2015-08-11 ENCOUNTER — Other Ambulatory Visit: Payer: Self-pay | Admitting: Internal Medicine

## 2015-08-13 NOTE — Telephone Encounter (Signed)
Tried to call patient to find out why we were receiving a refill request. HIs home number voice mail was messed up. The cell number on file is no longer valid. The medication was called in to the pharmacy 07-30-15 #30. He has an OV 08-20-15

## 2015-08-13 NOTE — Telephone Encounter (Signed)
Called again, no answer

## 2015-08-20 ENCOUNTER — Encounter: Payer: Self-pay | Admitting: Internal Medicine

## 2015-08-20 ENCOUNTER — Ambulatory Visit (INDEPENDENT_AMBULATORY_CARE_PROVIDER_SITE_OTHER): Payer: Medicare Other | Admitting: Internal Medicine

## 2015-08-20 VITALS — BP 112/60 | HR 69 | Temp 97.9°F | Ht 67.5 in | Wt 186.0 lb

## 2015-08-20 DIAGNOSIS — N183 Chronic kidney disease, stage 3 unspecified: Secondary | ICD-10-CM

## 2015-08-20 DIAGNOSIS — Z0000 Encounter for general adult medical examination without abnormal findings: Secondary | ICD-10-CM | POA: Diagnosis not present

## 2015-08-20 DIAGNOSIS — Z794 Long term (current) use of insulin: Secondary | ICD-10-CM | POA: Diagnosis not present

## 2015-08-20 DIAGNOSIS — J439 Emphysema, unspecified: Secondary | ICD-10-CM

## 2015-08-20 DIAGNOSIS — I10 Essential (primary) hypertension: Secondary | ICD-10-CM

## 2015-08-20 DIAGNOSIS — E1121 Type 2 diabetes mellitus with diabetic nephropathy: Secondary | ICD-10-CM

## 2015-08-20 DIAGNOSIS — J9611 Chronic respiratory failure with hypoxia: Secondary | ICD-10-CM | POA: Insufficient documentation

## 2015-08-20 DIAGNOSIS — G629 Polyneuropathy, unspecified: Secondary | ICD-10-CM

## 2015-08-20 LAB — COMPREHENSIVE METABOLIC PANEL
ALK PHOS: 94 U/L (ref 39–117)
ALT: 21 U/L (ref 0–53)
AST: 20 U/L (ref 0–37)
Albumin: 4.1 g/dL (ref 3.5–5.2)
BILIRUBIN TOTAL: 0.5 mg/dL (ref 0.2–1.2)
BUN: 44 mg/dL — AB (ref 6–23)
CO2: 29 meq/L (ref 19–32)
Calcium: 9.4 mg/dL (ref 8.4–10.5)
Chloride: 104 mEq/L (ref 96–112)
Creatinine, Ser: 1.69 mg/dL — ABNORMAL HIGH (ref 0.40–1.50)
GFR: 41.16 mL/min — ABNORMAL LOW (ref >60.00)
GLUCOSE: 145 mg/dL — AB (ref 70–99)
Potassium: 4.3 mEq/L (ref 3.5–5.1)
SODIUM: 143 meq/L (ref 135–145)
TOTAL PROTEIN: 7.5 g/dL (ref 6.0–8.3)

## 2015-08-20 LAB — T4, FREE: FREE T4: 0.81 ng/dL (ref 0.60–1.60)

## 2015-08-20 LAB — CBC WITH DIFFERENTIAL/PLATELET
BASOS ABS: 0 10*3/uL (ref 0.0–0.1)
Basophils Relative: 0.4 % (ref 0.0–3.0)
EOS PCT: 2.6 % (ref 0.0–5.0)
Eosinophils Absolute: 0.3 10*3/uL (ref 0.0–0.7)
HCT: 42.6 % (ref 39.0–52.0)
Hemoglobin: 14.4 g/dL (ref 13.0–17.0)
LYMPHS ABS: 2.7 10*3/uL (ref 0.7–4.0)
Lymphocytes Relative: 26.3 % (ref 12.0–46.0)
MCHC: 33.8 g/dL (ref 30.0–36.0)
MCV: 95.8 fl (ref 78.0–100.0)
MONO ABS: 0.7 10*3/uL (ref 0.1–1.0)
MONOS PCT: 7.3 % (ref 3.0–12.0)
NEUTROS ABS: 6.4 10*3/uL (ref 1.4–7.7)
NEUTROS PCT: 63.4 % (ref 43.0–77.0)
PLATELETS: 348 10*3/uL (ref 150.0–400.0)
RBC: 4.45 Mil/uL (ref 4.22–5.81)
RDW: 13.6 % (ref 11.5–15.5)
WBC: 10.1 10*3/uL (ref 4.0–10.5)

## 2015-08-20 LAB — LIPID PANEL
CHOL/HDL RATIO: 2
Cholesterol: 88 mg/dL (ref 0–200)
HDL: 39.2 mg/dL (ref >39.00)
LDL Cholesterol: 25 mg/dL (ref 0–99)
NONHDL: 48.82
Triglycerides: 117 mg/dL (ref 0.0–149.0)
VLDL: 23.4 mg/dL (ref 0.0–40.0)

## 2015-08-20 LAB — HM DIABETES FOOT EXAM

## 2015-08-20 LAB — HEMOGLOBIN A1C: Hgb A1c MFr Bld: 7.9 % — ABNORMAL HIGH (ref 4.6–6.5)

## 2015-08-20 MED ORDER — TETANUS-DIPHTHERIA TOXOIDS TD 5-2 LFU IM INJ: 0.5000 mL | INJECTION | Freq: Once | INTRAMUSCULAR | Status: DC

## 2015-08-20 NOTE — Assessment & Plan Note (Signed)
Limited stamina but over the recent acute exacerbation No changes needed

## 2015-08-20 NOTE — Progress Notes (Signed)
Subjective:    Patient ID: Miguel Huber, male    DOB: Dec 21, 1927, 82 y.o.   MRN: [id number]  HPI Here for Medicare wellness and follow up of chronic medical conditions Reviewed form and advanced directives Reviewed other doctors 1 fall with minor injury (passed out when sick) Vision has faded a bit--still drives okay Mild hearing issues--mostly if someone speaks too low No tobacco now--no alcohol Tries to walk regularly--but very little stamina No depressed or anhedonic No major cognitive issues Independent with instrumental ADLs  Was sick a few weeks back Went to urgent care and sent to ER due to low blood pressure Feels better now Cough is better Breathing is back to normal Wears the oxygen all the time Can walk 30 minutes or so before he has to rest now--like around the walkway to senior center (or in parking lot visiting friend at [facility])  Checks sugars twice a day 140-180 usually Did have 1 mild low sugar reaction--at most mild tremulousness Has chronic callous--gets it trimmed by Dr Elvina Mattes  Chronic back pain Usually mild and doesn't need meds if he is careful with his lifting  No chest pain No palpitations No dizziness---did pass out once when he was sick   Current Outpatient Prescriptions on File Prior to Visit  Medication Sig Dispense Refill  . aspirin EC 81 MG tablet Take 81 mg by mouth daily.    . fluticasone furoate-vilanterol (BREO ELLIPTA) 100-25 MCG/INH AEPB Inhale 1 puff into the lungs daily.    . furosemide (LASIX) 80 MG tablet Take 80 mg by mouth daily.    . insulin NPH Human (HUMULIN N,NOVOLIN N) 100 UNIT/ML injection Inject 25 Units into the skin 2 (two) times daily before a meal.    . insulin regular (NOVOLIN R,HUMULIN R) 100 units/mL injection Inject 10 Units into the skin 2 (two) times daily before a meal.    . ipratropium-albuterol (DUONEB) 0.5-2.5 (3) MG/3ML SOLN Take 3 mLs by nebulization 4 (four) times daily as needed (for wheezing/shortness  of breath).    Marland Kitchen lisinopril (PRINIVIL,ZESTRIL) 10 MG tablet Take 10 mg by mouth daily.  3  . omeprazole (PRILOSEC) 20 MG capsule Take 20 mg by mouth daily.    . ondansetron (ZOFRAN) 4 MG tablet Take 1 tablet (4 mg total) by mouth daily as needed for nausea or vomiting. 10 tablet 0  . polyethylene glycol (MIRALAX / GLYCOLAX) packet Take 17 g by mouth daily.    . rosuvastatin (CRESTOR) 10 MG tablet Take 10 mg by mouth at bedtime.    . temazepam (RESTORIL) 30 MG capsule Take 30 mg by mouth at bedtime.    Marland Kitchen allopurinol (ZYLOPRIM) 100 MG tablet Take 100 mg by mouth daily. Reported on 08/20/2015     No current facility-administered medications on file prior to visit.    Allergies  Allergen Reactions  . Theophyllines Hives    Past Medical History  Diagnosis Date  . Allergic rhinitis   . Hx of adenomatous colonic polyps   . Pulmonary fibrosis ([place])   . COPD (chronic obstructive pulmonary disease) ([place])   . GERD (gastroesophageal reflux disease)   . Erectile dysfunction   . Cardiomyopathy   . Hyperlipidemia   . Renal insufficiency   . Osteoarthritis   . Diabetes mellitus   . Hypertension   . DVT (deep venous thrombosis), right 5/13    Past Surgical History  Procedure Laterality Date  . Orif clavicle fracture      Left clavicle fracture-  child  . Orif ulnar fracture      Left readius/ ulna fracture child  . Lung biopsy      Right lung bx. negative 2001  . Cataract extraction      Cataracts/ IOL OU, [place] 2005, [place] ?  2000  . Cardiovascular stress test      Myoview stress negative EF 65-70% 08/04    Family History  Problem Relation Age of Onset  . Cancer Neg Hx     Social History   Social History  . Marital Status: Married    Spouse Name: N/A  . Number of Children: 3  . Years of Education: N/A   Occupational History  . retired     IT consultant   Social History Main Topics  . Smoking status: Former Research scientist (life sciences)  . Smokeless tobacco: Never Used  . Alcohol Use: No   . Drug Use: No  . Sexual Activity: Not on file   Other Topics Concern  . Not on file   Social History Narrative   Has platonic lady lfriend since ~2008  [facility]. Currently in nursing home after stroke.      Has living will now   No health care POA--has 3 children   Would like to try resuscitation--would accept ventilator only temporarily   Might accept tube feeds if cognitively unaware   Review of Systems  Appetite is fine Weight is down a few pounds Chronic sleep problems Nocturia x 1. Slow intermittent stream Bowels are okay with miralax No rash. Keeps up with dermatologist due to past skin cancer Heartburn controlled--- no dysphagia No sig joint swelling or pain Edentulous---dentures aren't wearable    Objective:   Physical Exam  Constitutional: He is oriented to person, place, and time. He appears well-developed and well-nourished. No distress.  HENT:  Mouth/Throat: Oropharynx is clear and moist. No oropharyngeal exudate.  Neck: Normal range of motion. Neck supple. No thyromegaly present.  Cardiovascular: Normal rate and normal heart sounds.  Exam reveals no gallop.   No murmur heard. Faint pedal pulses  Pulmonary/Chest: Effort normal. No respiratory distress. He has no wheezes. He has no rales.  Decreased breath sounds but clear  Abdominal: Soft. There is no tenderness.  Musculoskeletal: He exhibits no edema.  Lymphadenopathy:    He has no cervical adenopathy.  Neurological: He is alert and oriented to person, place, and time.  President-- "?" 100-88 [id number] Recall 3/3  Decreased sensation in feet  Skin:  No foot lesions  Psychiatric: He has a normal mood and affect. His behavior is normal.          Assessment & Plan:

## 2015-08-20 NOTE — Assessment & Plan Note (Signed)
Probably diabetic No sig pain so no Rx needed

## 2015-08-20 NOTE — Assessment & Plan Note (Signed)
Seems to still have acceptable control Even 9% okay at his age

## 2015-08-20 NOTE — Assessment & Plan Note (Signed)
Does okay with oxygen all the time

## 2015-08-20 NOTE — Progress Notes (Signed)
Pre visit review using our clinic review tool, if applicable. No additional management support is needed unless otherwise documented below in the visit note. 

## 2015-08-20 NOTE — Assessment & Plan Note (Signed)
I have personally reviewed the Medicare Annual Wellness questionnaire and have noted 1. The patient's medical and social history 2. Their use of alcohol, tobacco or illicit drugs 3. Their current medications and supplements 4. The patient's functional ability including ADL's, fall risks, home safety risks and hearing or visual             impairment. 5. Diet and physical activities 6. Evidence for depression or mood disorders  The patients weight, height, BMI and visual acuity have been recorded in the chart I have made referrals, counseling and provided education to the patient based review of the above and I have provided the pt with a written personalized care plan for preventive services.  I have provided you with a copy of your personalized plan for preventive services. Please take the time to review along with your updated medication list.  No cancer screening due to age Td Rx sent Otherwise, UTD

## 2015-08-20 NOTE — Assessment & Plan Note (Signed)
On ACEI  Will repeat labs

## 2015-08-20 NOTE — Assessment & Plan Note (Signed)
BP Readings from Last 3 Encounters:  08/20/15 112/60  08/06/15 114/60  06/14/15 119/68   Good control

## 2015-08-22 DIAGNOSIS — H401231 Low-tension glaucoma, bilateral, mild stage: Secondary | ICD-10-CM | POA: Diagnosis not present

## 2015-08-22 LAB — HM DIABETES EYE EXAM

## 2015-08-27 ENCOUNTER — Other Ambulatory Visit: Payer: Self-pay | Admitting: Internal Medicine

## 2015-08-27 ENCOUNTER — Encounter: Payer: Self-pay | Admitting: Internal Medicine

## 2015-08-28 NOTE — Telephone Encounter (Signed)
Left refill on voice mail at pharmacy  

## 2015-08-28 NOTE — Telephone Encounter (Signed)
Approved: 30 x 0 

## 2015-08-28 NOTE — Telephone Encounter (Signed)
Last refill 07-30-15 #30/0 Last OV 08-20-15 Next OV 02-21-16

## 2015-09-05 DIAGNOSIS — E1142 Type 2 diabetes mellitus with diabetic polyneuropathy: Secondary | ICD-10-CM | POA: Diagnosis not present

## 2015-09-05 DIAGNOSIS — L851 Acquired keratosis [keratoderma] palmaris et plantaris: Secondary | ICD-10-CM | POA: Diagnosis not present

## 2015-09-05 DIAGNOSIS — B351 Tinea unguium: Secondary | ICD-10-CM | POA: Diagnosis not present

## 2015-09-24 ENCOUNTER — Other Ambulatory Visit: Payer: Self-pay | Admitting: Internal Medicine

## 2015-09-25 NOTE — Telephone Encounter (Signed)
Left refill on voice mail at pharmacy  

## 2015-09-25 NOTE — Telephone Encounter (Signed)
Approved: 30 x 0 

## 2015-09-25 NOTE — Telephone Encounter (Signed)
Last filled 08-28-15 #30 Last OV 08-20-15 Next OV 02-21-16

## 2015-09-29 ENCOUNTER — Other Ambulatory Visit: Payer: Self-pay | Admitting: Internal Medicine

## 2015-10-05 ENCOUNTER — Other Ambulatory Visit: Payer: Self-pay | Admitting: Internal Medicine

## 2015-10-08 ENCOUNTER — Encounter: Payer: Self-pay | Admitting: Internal Medicine

## 2015-10-08 ENCOUNTER — Ambulatory Visit (INDEPENDENT_AMBULATORY_CARE_PROVIDER_SITE_OTHER): Payer: Medicare Other | Admitting: Internal Medicine

## 2015-10-08 VITALS — BP 92/70 | HR 78 | Temp 98.1°F | Wt 189.0 lb

## 2015-10-08 DIAGNOSIS — S8002XA Contusion of left knee, initial encounter: Secondary | ICD-10-CM

## 2015-10-08 NOTE — Assessment & Plan Note (Signed)
Nothing to suggest fracture or sig intraarticular pathology Discussed continuing heat Add tylenol tid for now Ice if increased swelling after on it

## 2015-10-08 NOTE — Progress Notes (Signed)
Subjective:    Patient ID: Miguel Huber, male    DOB: 1927/12/24, 82 y.o.   MRN: [id number]  HPI Here due to left knee pain  [place] 4 days ago--while visiting lady friend at nursing home Going to bathroom and walker not nearby--left leg gave way and he fell right onto the knee Trouble getting but pulled himself up on chair He sat for a while--then able to go to bathroom. Not able to walk on it as well though No immediate treatment  Ongoing pain but didn't want the walk in clinic No meds for this Did try cold pack at first when he got home some hours later Has tried heat since  Current Outpatient Prescriptions on File Prior to Visit  Medication Sig Dispense Refill  . allopurinol (ZYLOPRIM) 100 MG tablet Take 100 mg by mouth daily. Reported on 08/20/2015    . aspirin EC 81 MG tablet Take 81 mg by mouth daily.    . fluticasone furoate-vilanterol (BREO ELLIPTA) 100-25 MCG/INH AEPB Inhale 1 puff into the lungs daily.    . furosemide (LASIX) 80 MG tablet Take 80 mg by mouth daily.    . insulin NPH Human (HUMULIN N,NOVOLIN N) 100 UNIT/ML injection Inject 25 Units into the skin 2 (two) times daily before a meal.    . insulin regular (NOVOLIN R,HUMULIN R) 100 units/mL injection Inject 10 Units into the skin 2 (two) times daily before a meal.    . ipratropium-albuterol (DUONEB) 0.5-2.5 (3) MG/3ML SOLN Take 3 mLs by nebulization 4 (four) times daily as needed (for wheezing/shortness of breath).    Marland Kitchen lisinopril (PRINIVIL,ZESTRIL) 10 MG tablet TAKE 1 TABLET BY MOUTH EVERY DAY 90 tablet 3  . omeprazole (PRILOSEC) 20 MG capsule Take 20 mg by mouth daily.    . polyethylene glycol (MIRALAX / GLYCOLAX) packet Take 17 g by mouth daily.    . rosuvastatin (CRESTOR) 10 MG tablet Take 10 mg by mouth at bedtime.    . temazepam (RESTORIL) 30 MG capsule TAKE ONE CAPSULE BY MOUTH AT BEDTIME 30 capsule 0   No current facility-administered medications on file prior to visit.    Allergies  Allergen  Reactions  . Theophyllines Hives    Past Medical History  Diagnosis Date  . Allergic rhinitis   . Hx of adenomatous colonic polyps   . Pulmonary fibrosis ([place])   . COPD (chronic obstructive pulmonary disease) ([place])   . GERD (gastroesophageal reflux disease)   . Erectile dysfunction   . Cardiomyopathy   . Hyperlipidemia   . Renal insufficiency   . Osteoarthritis   . Diabetes mellitus   . Hypertension   . DVT (deep venous thrombosis), right 5/13    Past Surgical History  Procedure Laterality Date  . Orif clavicle fracture      Left clavicle fracture- child  . Orif ulnar fracture      Left readius/ ulna fracture child  . Lung biopsy      Right lung bx. negative 2001  . Cataract extraction      Cataracts/ IOL OU, [place] 2005, [place] ?  2000  . Cardiovascular stress test      Myoview stress negative EF 65-70% 08/04    Family History  Problem Relation Age of Onset  . Cancer Neg Hx     Social History   Social History  . Marital Status: Married    Spouse Name: N/A  . Number of Children: 3  . Years of Education: N/A  Occupational History  . retired     IT consultant   Social History Main Topics  . Smoking status: Former Research scientist (life sciences)  . Smokeless tobacco: Never Used  . Alcohol Use: No  . Drug Use: No  . Sexual Activity: Not on file   Other Topics Concern  . Not on file   Social History Narrative   Has platonic lady lfriend since ~2008  [facility]. Currently in nursing home after stroke.      Has living will now   No health care POA--has 3 children   Would like to try resuscitation--would accept ventilator only temporarily   Might accept tube feeds if cognitively unaware   Review of Systems  Feels well No fever     Objective:   Physical Exam  Musculoskeletal:  Mild swelling outside left knee--no clear synovitis No meniscus or ligament findings No tenderness over patella  Neurological:  Gait slow but full weight bearing          Assessment &  Plan:

## 2015-10-08 NOTE — Progress Notes (Signed)
Pre visit review using our clinic review tool, if applicable. No additional management support is needed unless otherwise documented below in the visit note. 

## 2015-10-08 NOTE — Patient Instructions (Signed)
Continue heat--but ice if you have increased pain after being on it for a while. Try tylenol 500 or 650mg  three times a day till the pain is better.

## 2015-10-11 DIAGNOSIS — J449 Chronic obstructive pulmonary disease, unspecified: Secondary | ICD-10-CM | POA: Diagnosis not present

## 2015-10-11 DIAGNOSIS — J9611 Chronic respiratory failure with hypoxia: Secondary | ICD-10-CM | POA: Diagnosis not present

## 2015-10-18 ENCOUNTER — Other Ambulatory Visit: Payer: Self-pay | Admitting: Internal Medicine

## 2015-10-19 ENCOUNTER — Other Ambulatory Visit: Payer: Self-pay | Admitting: Internal Medicine

## 2015-10-23 NOTE — Telephone Encounter (Signed)
Last filled 09-25-15 #30 Last OV 08-20-15 Next OV 02-21-16

## 2015-10-23 NOTE — Telephone Encounter (Signed)
Approved: 30 x 0 

## 2015-10-23 NOTE — Telephone Encounter (Signed)
Left refill on voice mail at pharmacy  

## 2015-11-09 DIAGNOSIS — N2581 Secondary hyperparathyroidism of renal origin: Secondary | ICD-10-CM | POA: Diagnosis not present

## 2015-11-09 DIAGNOSIS — I129 Hypertensive chronic kidney disease with stage 1 through stage 4 chronic kidney disease, or unspecified chronic kidney disease: Secondary | ICD-10-CM | POA: Diagnosis not present

## 2015-11-09 DIAGNOSIS — E1122 Type 2 diabetes mellitus with diabetic chronic kidney disease: Secondary | ICD-10-CM | POA: Diagnosis not present

## 2015-11-09 DIAGNOSIS — E785 Hyperlipidemia, unspecified: Secondary | ICD-10-CM | POA: Diagnosis not present

## 2015-11-09 DIAGNOSIS — N184 Chronic kidney disease, stage 4 (severe): Secondary | ICD-10-CM | POA: Diagnosis not present

## 2015-11-16 ENCOUNTER — Emergency Department: Payer: Medicare Other

## 2015-11-16 ENCOUNTER — Other Ambulatory Visit: Payer: Self-pay

## 2015-11-16 ENCOUNTER — Inpatient Hospital Stay
Admission: EM | Admit: 2015-11-16 | Discharge: 2015-11-18 | DRG: 189 | Disposition: A | Discharge status: Home / Self Care | Payer: Medicare Other | Attending: Internal Medicine | Admitting: Internal Medicine

## 2015-11-16 DIAGNOSIS — R0600 Dyspnea, unspecified: Secondary | ICD-10-CM | POA: Diagnosis not present

## 2015-11-16 DIAGNOSIS — I10 Essential (primary) hypertension: Secondary | ICD-10-CM | POA: Diagnosis present

## 2015-11-16 DIAGNOSIS — Z9981 Dependence on supplemental oxygen: Secondary | ICD-10-CM

## 2015-11-16 DIAGNOSIS — M1990 Unspecified osteoarthritis, unspecified site: Secondary | ICD-10-CM | POA: Diagnosis present

## 2015-11-16 DIAGNOSIS — E785 Hyperlipidemia, unspecified: Secondary | ICD-10-CM | POA: Diagnosis present

## 2015-11-16 DIAGNOSIS — K219 Gastro-esophageal reflux disease without esophagitis: Secondary | ICD-10-CM | POA: Diagnosis present

## 2015-11-16 DIAGNOSIS — Z888 Allergy status to other drugs, medicaments and biological substances status: Secondary | ICD-10-CM

## 2015-11-16 DIAGNOSIS — Z79899 Other long term (current) drug therapy: Secondary | ICD-10-CM

## 2015-11-16 DIAGNOSIS — I429 Cardiomyopathy, unspecified: Secondary | ICD-10-CM

## 2015-11-16 DIAGNOSIS — Z9889 Other specified postprocedural states: Secondary | ICD-10-CM

## 2015-11-16 DIAGNOSIS — R531 Weakness: Secondary | ICD-10-CM | POA: Diagnosis not present

## 2015-11-16 DIAGNOSIS — I509 Heart failure, unspecified: Secondary | ICD-10-CM

## 2015-11-16 DIAGNOSIS — J9801 Acute bronchospasm: Secondary | ICD-10-CM | POA: Diagnosis present

## 2015-11-16 DIAGNOSIS — J9620 Acute and chronic respiratory failure, unspecified whether with hypoxia or hypercapnia: Secondary | ICD-10-CM | POA: Diagnosis present

## 2015-11-16 DIAGNOSIS — I130 Hypertensive heart and chronic kidney disease with heart failure and stage 1 through stage 4 chronic kidney disease, or unspecified chronic kidney disease: Secondary | ICD-10-CM | POA: Diagnosis present

## 2015-11-16 DIAGNOSIS — Z9849 Cataract extraction status, unspecified eye: Secondary | ICD-10-CM

## 2015-11-16 DIAGNOSIS — J439 Emphysema, unspecified: Secondary | ICD-10-CM | POA: Diagnosis not present

## 2015-11-16 DIAGNOSIS — Z86010 Personal history of colonic polyps: Secondary | ICD-10-CM | POA: Diagnosis not present

## 2015-11-16 DIAGNOSIS — Z86718 Personal history of other venous thrombosis and embolism: Secondary | ICD-10-CM

## 2015-11-16 DIAGNOSIS — I110 Hypertensive heart disease with heart failure: Secondary | ICD-10-CM | POA: Diagnosis not present

## 2015-11-16 DIAGNOSIS — J449 Chronic obstructive pulmonary disease, unspecified: Secondary | ICD-10-CM

## 2015-11-16 DIAGNOSIS — Z87891 Personal history of nicotine dependence: Secondary | ICD-10-CM | POA: Diagnosis not present

## 2015-11-16 DIAGNOSIS — Z7982 Long term (current) use of aspirin: Secondary | ICD-10-CM

## 2015-11-16 DIAGNOSIS — Z794 Long term (current) use of insulin: Secondary | ICD-10-CM

## 2015-11-16 DIAGNOSIS — E1129 Type 2 diabetes mellitus with other diabetic kidney complication: Secondary | ICD-10-CM | POA: Diagnosis present

## 2015-11-16 DIAGNOSIS — R278 Other lack of coordination: Secondary | ICD-10-CM | POA: Diagnosis not present

## 2015-11-16 DIAGNOSIS — J8410 Pulmonary fibrosis, unspecified: Secondary | ICD-10-CM

## 2015-11-16 DIAGNOSIS — N184 Chronic kidney disease, stage 4 (severe): Secondary | ICD-10-CM | POA: Diagnosis present

## 2015-11-16 DIAGNOSIS — Z7951 Long term (current) use of inhaled steroids: Secondary | ICD-10-CM

## 2015-11-16 DIAGNOSIS — R0602 Shortness of breath: Secondary | ICD-10-CM | POA: Diagnosis not present

## 2015-11-16 DIAGNOSIS — E1122 Type 2 diabetes mellitus with diabetic chronic kidney disease: Secondary | ICD-10-CM | POA: Diagnosis present

## 2015-11-16 DIAGNOSIS — J441 Chronic obstructive pulmonary disease with (acute) exacerbation: Secondary | ICD-10-CM | POA: Diagnosis present

## 2015-11-16 DIAGNOSIS — J9621 Acute and chronic respiratory failure with hypoxia: Secondary | ICD-10-CM

## 2015-11-16 LAB — URINALYSIS COMPLETE WITH MICROSCOPIC (ARMC ONLY)
BILIRUBIN URINE: NEGATIVE
Bacteria, UA: NONE SEEN
GLUCOSE, UA: NEGATIVE mg/dL
KETONES UR: NEGATIVE mg/dL
Leukocytes, UA: NEGATIVE
Nitrite: NEGATIVE
Protein, ur: NEGATIVE mg/dL
Specific Gravity, Urine: 1.009 (ref 1.005–1.030)
Squamous Epithelial / LPF: NONE SEEN
pH: 6 (ref 5.0–8.0)

## 2015-11-16 LAB — COMPREHENSIVE METABOLIC PANEL
ALT: 19 U/L (ref 17–63)
AST: 23 U/L (ref 15–41)
Albumin: 3.9 g/dL (ref 3.5–5.0)
Alkaline Phosphatase: 84 U/L (ref 38–126)
Anion gap: 10 (ref 5–15)
BILIRUBIN TOTAL: 1.1 mg/dL (ref 0.3–1.2)
BUN: 36 mg/dL — AB (ref 6–20)
CO2: 22 mmol/L (ref 22–32)
CREATININE: 1.63 mg/dL — AB (ref 0.61–1.24)
Calcium: 8.8 mg/dL — ABNORMAL LOW (ref 8.9–10.3)
Chloride: 110 mmol/L (ref 101–111)
GFR, EST AFRICAN AMERICAN: 43 mL/min — AB (ref >60)
GFR, EST NON AFRICAN AMERICAN: 37 mL/min — AB (ref >60)
Glucose, Bld: 111 mg/dL — ABNORMAL HIGH (ref 65–99)
Potassium: 3.9 mmol/L (ref 3.5–5.1)
Sodium: 142 mmol/L (ref 135–145)
TOTAL PROTEIN: 7 g/dL (ref 6.5–8.1)

## 2015-11-16 LAB — CBC
HEMATOCRIT: 43.8 % (ref 40.0–52.0)
Hemoglobin: 14.9 g/dL (ref 13.0–18.0)
MCH: 32.7 pg (ref 26.0–34.0)
MCHC: 34 g/dL (ref 32.0–36.0)
MCV: 96.2 fL (ref 80.0–100.0)
Platelets: 197 10*3/uL (ref 150–440)
RBC: 4.55 MIL/uL (ref 4.40–5.90)
RDW: 13.3 % (ref 11.5–14.5)
WBC: 9.1 10*3/uL (ref 3.8–10.6)

## 2015-11-16 LAB — BRAIN NATRIURETIC PEPTIDE: B Natriuretic Peptide: 120 pg/mL — ABNORMAL HIGH (ref 0.0–100.0)

## 2015-11-16 LAB — TROPONIN I: Troponin I: <0.03 ng/mL (ref <0.031)

## 2015-11-16 MED ORDER — FUROSEMIDE 10 MG/ML IJ SOLN
40.0000 mg | Freq: Once | INTRAMUSCULAR | Status: AC
Start: 2015-11-16 — End: 2015-11-16
  Administered 2015-11-16: 40 mg via INTRAVENOUS
  Filled 2015-11-16: qty 4

## 2015-11-16 MED ORDER — METHYLPREDNISOLONE SODIUM SUCC 125 MG IJ SOLR
60.0000 mg | Freq: Two times a day (BID) | INTRAMUSCULAR | Status: DC
  Administered 2015-11-17 (×2): 60 mg via INTRAVENOUS
  Filled 2015-11-16 (×2): qty 2

## 2015-11-16 MED ORDER — FUROSEMIDE 10 MG/ML IJ SOLN: 40.0000 mg | Freq: Two times a day (BID) | INTRAMUSCULAR | Status: DC

## 2015-11-16 MED ORDER — ENOXAPARIN SODIUM 100 MG/ML ~~LOC~~ SOLN
1.0000 mg/kg | Freq: Once | SUBCUTANEOUS | Status: AC
  Administered 2015-11-17: 85 mg via SUBCUTANEOUS
  Filled 2015-11-16: qty 1

## 2015-11-16 MED ORDER — ALBUTEROL SULFATE (2.5 MG/3ML) 0.083% IN NEBU: 5.0000 mg | INHALATION_SOLUTION | Freq: Once | RESPIRATORY_TRACT | Status: DC

## 2015-11-16 MED ORDER — ONDANSETRON HCL 4 MG/2ML IJ SOLN
INTRAMUSCULAR | Status: AC
  Administered 2015-11-16: 4 mg
  Filled 2015-11-16: qty 2

## 2015-11-16 NOTE — ED Provider Notes (Signed)
[facility] Emergency Department Provider Note  Time seen: 7:38 PM  I have reviewed the triage vital signs and the nursing notes.   HISTORY  Chief Complaint Shortness of Breath and Weakness    HPI Miguel Huber is a 82 y.o. male with a past medical history of COPD, gastric reflux, cardiomyopathy, hypertension, peripheral edema, who presents the emergency department with difficulty breathing, nausea and generalized weakness. According to the patient for the past 1-2 weeks he has had progressively worsening shortness of breath especially with exertion. Progressively worsening generalized weakness now feeling very unstable/unsteady when ambulating. He also began with nausea with 2 episodes of vomiting today. Denies any diaphoresis. Denies any chest pain. Patient states his doctor recently decreased his Lasix from 80 mg daily to 40 mg daily one week ago.The patient does wear 2 L oxygen by nasal cannula 24/7 at baseline.     Past Medical History  Diagnosis Date  . Allergic rhinitis   . Hx of adenomatous colonic polyps   . Pulmonary fibrosis ([place])   . COPD (chronic obstructive pulmonary disease) ([place])   . GERD (gastroesophageal reflux disease)   . Erectile dysfunction   . Cardiomyopathy   . Hyperlipidemia   . Renal insufficiency   . Osteoarthritis   . Diabetes mellitus   . Hypertension   . DVT (deep venous thrombosis), right 5/13    Patient Active Problem List   Diagnosis Date Noted  . Contusion of left knee 10/08/2015  . Chronic hypoxemic respiratory failure ([place]) 08/20/2015  . Routine general medical examination at a health care facility 08/02/2013  . Neuropathy ([place]) 08/02/2013  . COPD with exacerbation ([place]) 03/22/2012  . Constipation, chronic 10/10/2011  . Type 2 diabetes, controlled, with renal manifestation ([place]) 06/06/2011  . Hypertension   . UNSPECIFIED VENOUS INSUFFICIENCY 08/14/2010  . OSTEOARTHRITIS 04/09/2010  . BACK PAIN, LUMBAR, WITH  RADICULOPATHY 06/01/2008  . Chronic kidney disease, stage III (moderate) 04/20/2007  . Hyperlipemia 10/13/2006  . ALLERGIC RHINITIS 10/13/2006  . COPD (chronic obstructive pulmonary disease) with emphysema ([place]) 10/13/2006  . GERD 10/13/2006    Past Surgical History  Procedure Laterality Date  . Orif clavicle fracture      Left clavicle fracture- child  . Orif ulnar fracture      Left readius/ ulna fracture child  . Lung biopsy      Right lung bx. negative 2001  . Cataract extraction      Cataracts/ IOL OU, [place] 2005, [place] ?  2000  . Cardiovascular stress test      Myoview stress negative EF 65-70% 08/04    Current Outpatient Rx  Name  Route  Sig  Dispense  Refill  . allopurinol (ZYLOPRIM) 100 MG tablet   Oral   Take 100 mg by mouth daily. Reported on 08/20/2015         . aspirin EC 81 MG tablet   Oral   Take 81 mg by mouth daily.         . fluticasone furoate-vilanterol (BREO ELLIPTA) 100-25 MCG/INH AEPB   Inhalation   Inhale 1 puff into the lungs daily.         . furosemide (LASIX) 80 MG tablet   Oral   Take 80 mg by mouth daily.         . insulin NPH Human (HUMULIN N,NOVOLIN N) 100 UNIT/ML injection   Subcutaneous   Inject 25 Units into the skin 2 (two) times daily before a meal.         .  insulin regular (NOVOLIN R,HUMULIN R) 100 units/mL injection   Subcutaneous   Inject 10 Units into the skin 2 (two) times daily before a meal.         . ipratropium-albuterol (DUONEB) 0.5-2.5 (3) MG/3ML SOLN   Nebulization   Take 3 mLs by nebulization 4 (four) times daily as needed (for wheezing/shortness of breath).         Marland Kitchen lisinopril (PRINIVIL,ZESTRIL) 10 MG tablet      TAKE 1 TABLET BY MOUTH EVERY DAY   90 tablet   3   . omeprazole (PRILOSEC) 20 MG capsule   Oral   Take 20 mg by mouth daily.         . polyethylene glycol (MIRALAX / GLYCOLAX) packet   Oral   Take 17 g by mouth daily.         . rosuvastatin (CRESTOR) 10 MG tablet   Oral    Take 10 mg by mouth at bedtime.         . rosuvastatin (CRESTOR) 20 MG tablet      TAKE 1 TABLET (20 MG TOTAL) BY MOUTH DAILY.   90 tablet   3   . temazepam (RESTORIL) 30 MG capsule      TAKE ONE CAPSULE BY MOUTH EVERY NIGHT AT BEDTIME   30 capsule   0     Not to exceed 5 additional fills before 03/23/2016 ...     Allergies Theophyllines  Family History  Problem Relation Age of Onset  . Cancer Neg Hx     Social History Social History  Substance Use Topics  . Smoking status: Former Research scientist (life sciences)  . Smokeless tobacco: Never Used  . Alcohol Use: No    Review of Systems Constitutional: Negative for fever. Cardiovascular: Negative for chest pain. Respiratory: Positive for shortness of breath Gastrointestinal: Negative for abdominal pain. Positive for nausea and vomiting. Negative for diarrhea. Genitourinary: Negative for dysuria. Musculoskeletal: Negative for back pain Neurological: Negative for headaches, focal weakness or numbness. Positive for generalized weakness. 10-point ROS otherwise negative.  ____________________________________________   PHYSICAL EXAM:  VITAL SIGNS: ED Triage Vitals  Enc Vitals Group     BP 11/16/15 1854 141/70 mmHg     Pulse Rate 11/16/15 1854 68     Resp 11/16/15 1854 20     Temp 11/16/15 1854 97.9 F (36.6 C)     Temp Source 11/16/15 1854 Oral     SpO2 11/16/15 1854 94 %     Weight 11/16/15 1854 190 lb (86.183 kg)     Height --      Head Cir --      Peak Flow --      Pain Score 11/16/15 1854 0     Pain Loc --      Pain Edu? --      Excl. in [place]? --     Constitutional: Alert and oriented. Well appearing and in no distress. Eyes: Normal exam ENT   Head: Normocephalic and atraumatic.   Mouth/Throat: Mucous membranes are moist. Cardiovascular: Normal rate, regular rhythm. No murmur Respiratory: Normal respiratory effort without tachypnea nor retractions. Breath sounds are clear  Gastrointestinal: Soft and nontender. No  distention.  Musculoskeletal: Nontender with normal range of motion in all extremities. Mild lower extremity edema, equal bilaterally. No Tenderness. Neurologic:  Normal speech and language. No gross focal neurologic deficits Skin:  Skin is warm, dry and intact.  Psychiatric: Mood and affect are normal.   ____________________________________________    EKG  EKG reviewed and interpreted by myself shows sinus rhythm at 69 bpm, narrow QRS, normal axis, normal intervals, no concerning ST changes. Overall reassuring EKG.  ____________________________________________    RADIOLOGY  Chest x-ray stable, no acute disease noted.  ____________________________________________    INITIAL IMPRESSION / ASSESSMENT AND PLAN / ED COURSE  Pertinent labs & imaging results that were available during my care of the patient were reviewed by me and considered in my medical decision making (see chart for details).  The patient presents the emergency department with worsening generalized weakness over the past 2 weeks, worsening shortness breath over the past 2 weeks, nausea with vomiting today. Denies abdominal pain. Nontender abdomen. Denies chest pain. Patient's chest x-ray is largely stable from prior chest x-ray. We will check labs, and closely monitor in the emergency department.  Patient is now requiring 4 L of oxygen due to desaturation 90% on 2 L. Patient states increased difficulty breathing. BNP is elevated at 120. Troponin is negative. Labs are largely within normal limits.  Given the patient's increased generalized weakness with increased oxygen requirement and difficulty breathing anticipate likely admission to the hospital for diuresis.  Patient continues to be significantly short of breath. I discussed discharged home doubling his Lasix for the next 5 days versus admission to the hospital. The son states his shortness of breath is significantly worse than baseline. Given the patient's age,  comorbidities, with increased difficulty breathing and increased oxygen requirement we will admit to the hospital for IV diuresis. Patient does 40 mg of IV Lasix in the emergency department.  ____________________________________________   FINAL CLINICAL IMPRESSION(S) / ED DIAGNOSES  Generalized weakness Dyspnea Nausea CHF exacerbation  Harvest Dark, MD 11/16/15 2055

## 2015-11-16 NOTE — H&P (Signed)
PCP:   Viviana Simpler, MD   Chief Complaint:  Shortness of breath  HPI: This is a 82 year old gentleman with COPD, chronic respiratory failure and pulmonary fibrosis. He uses 2 L oxygen at home. He presented to ER after he developed increasing shortness of breath today. Per family he has been weak for the past week or so. There is reports of any fever, chills, nausea or vomiting or lower extremity edema. He denies any chest pain. He does have a mild nonproductive cough. He did see his PCP 1 week ago who we decreased his Lasix. Today he became short of breath and was brought to the ER. The chest x-ray shows chronic pulmonary fibrosis and his BNP is 120. The patient is obviously short of breath during the interview.  Review of Systems:  The patient denies anorexia, fever, weight loss,, vision loss, decreased hearing, hoarseness, chest pain, syncope, dyspnea on exertion, peripheral edema, balance deficits, hemoptysis, abdominal pain, melena, hematochezia, severe indigestion/heartburn, hematuria, incontinence, genital sores, muscle weakness, suspicious skin lesions, transient blindness, difficulty walking, depression, unusual weight change, abnormal bleeding, enlarged lymph nodes, angioedema, and breast masses.  Past Medical History: Past Medical History  Diagnosis Date  . Allergic rhinitis   . Hx of adenomatous colonic polyps   . Pulmonary fibrosis ([place])   . COPD (chronic obstructive pulmonary disease) ([place])   . GERD (gastroesophageal reflux disease)   . Erectile dysfunction   . Cardiomyopathy   . Hyperlipidemia   . Renal insufficiency   . Osteoarthritis   . Diabetes mellitus   . Hypertension   . DVT (deep venous thrombosis), right 5/13   Past Surgical History  Procedure Laterality Date  . Orif clavicle fracture      Left clavicle fracture- child  . Orif ulnar fracture      Left readius/ ulna fracture child  . Lung biopsy      Right lung bx. negative 2001  . Cataract extraction       Cataracts/ IOL OU, [place] 2005, [place] ?  2000  . Cardiovascular stress test      Myoview stress negative EF 65-70% 08/04    Medications: Prior to Admission medications   Medication Sig Start Date End Date Taking? Authorizing Provider  aspirin EC 81 MG tablet Take 81 mg by mouth daily.   Yes Historical Provider, MD  fluocinonide cream (LIDEX) [id number] % Apply 1 application topically 2 (two) times daily.   Yes Historical Provider, MD  fluticasone furoate-vilanterol (BREO ELLIPTA) 100-25 MCG/INH AEPB Inhale 1 puff into the lungs daily.   Yes Historical Provider, MD  furosemide (LASIX) 80 MG tablet Take 80 mg by mouth daily.   Yes Historical Provider, MD  insulin NPH Human (HUMULIN N,NOVOLIN N) 100 UNIT/ML injection Inject 25 Units into the skin 2 (two) times daily.    Yes Historical Provider, MD  insulin regular (NOVOLIN R,HUMULIN R) 100 units/mL injection Inject 10 Units into the skin 2 (two) times daily before a meal.   Yes Historical Provider, MD  ipratropium-albuterol (DUONEB) 0.5-2.5 (3) MG/3ML SOLN Take 3 mLs by nebulization every 6 (six) hours as needed (for wheezing/shortness of breath).    Yes Historical Provider, MD  lisinopril (PRINIVIL,ZESTRIL) 10 MG tablet Take 10 mg by mouth daily.   Yes Historical Provider, MD  omeprazole (PRILOSEC) 20 MG capsule Take 20 mg by mouth daily.   Yes Historical Provider, MD  polyethylene glycol (MIRALAX / GLYCOLAX) packet Take 17 g by mouth daily.   Yes Historical Provider, MD  rosuvastatin (CRESTOR) 20 MG tablet Take 20 mg by mouth at bedtime.   Yes Historical Provider, MD  temazepam (RESTORIL) 30 MG capsule Take 30 mg by mouth at bedtime as needed for sleep.   Yes Historical Provider, MD    Allergies:   Allergies  Allergen Reactions  . Theophyllines Hives    Social History:  reports that he has quit smoking. He has never used smokeless tobacco. He reports that he does not drink alcohol or use illicit drugs.  Family History: Family History   Problem Relation Age of Onset  . Cancer Neg Hx     Physical Exam: Filed Vitals:   11/16/15 1945 11/16/15 2030 11/16/15 2100 11/16/15 2130  BP: 145/69 130/61 125/64 122/64  Pulse: 54 54 61 59  Temp:      TempSrc:      Resp: 21 24  20   Weight:      SpO2: 96% 98% 95% 97%    General:  Alert and oriented times three, well developed and nourished, no acute distress Eyes: PERRLA, pink conjunctiva, no scleral icterus ENT: Moist oral mucosa, neck supple, no thyromegaly Lungs: Overall decreased air exchange. Positive crackles greater on the left, no wheeze appreciated, no use of accessory muscles Cardiovascular: regular rate and rhythm, no regurgitation, no gallops, no murmurs. No carotid bruits, no JVD Abdomen: soft, positive BS, non-tender, non-distended, no organomegaly, not an acute abdomen GU: not examined Neuro: CN II - XII grossly intact, sensation intact Musculoskeletal: strength 5/5 all extremities, no clubbing, cyanosis or edema Skin: no rash, no subcutaneous crepitation, no decubitus Psych: appropriate patient   Labs on Admission:   Recent Labs  11/16/15 1922  NA 142  K 3.9  CL 110  CO2 22  GLUCOSE 111*  BUN 36*  CREATININE 1.63*  CALCIUM 8.8*    Recent Labs  11/16/15 1922  AST 23  ALT 19  ALKPHOS 84  BILITOT 1.1  PROT 7.0  ALBUMIN 3.9   No results for input(s): LIPASE, AMYLASE in the last 72 hours.  Recent Labs  11/16/15 1922  WBC 9.1  HGB 14.9  HCT 43.8  MCV 96.2  PLT 197    Recent Labs  11/16/15 1922  TROPONINI <0.03   Invalid input(s): POCBNP No results for input(s): DDIMER in the last 72 hours. No results for input(s): HGBA1C in the last 72 hours. No results for input(s): CHOL, HDL, LDLCALC, TRIG, CHOLHDL, LDLDIRECT in the last 72 hours. No results for input(s): TSH, T4TOTAL, T3FREE, THYROIDAB in the last 72 hours.  Invalid input(s): FREET3 No results for input(s): VITAMINB12, FOLATE, FERRITIN, TIBC, IRON, RETICCTPCT in the last 72  hours.  Micro Results: No results found for this or any previous visit (from the past 240 hour(s)).   Radiological Exams on Admission: Dg Chest 2 View  11/16/2015  CLINICAL DATA:  Shortness of breath EXAM: CHEST  2 VIEW COMPARISON:  08/06/2015 chest radiograph. FINDINGS: Stable cardiomediastinal silhouette with top-normal heart size. No pneumothorax. No pleural effusion. Prominent reticular interstitial opacities throughout the peripheral basilar lungs, not appreciably changed. No pulmonary edema or superimposed acute consolidative airspace disease. IMPRESSION: No appreciable change in prominent peripheral basilar predominant interstitial opacities suggesting chronic interstitial lung disease. No acute consolidative airspace disease to suggest a superimposed pneumonia . Electronically Signed   By: Ilona Sorrel M.D.   On: 11/16/2015 19:26    EXAM: CT CHEST WITHOUT CONTRAST  TECHNIQUE: Multidetector CT imaging of the chest was performed following the standard protocol without IV contrast.  COMPARISON: Chest radiograph performed earlier today at 6:56 p.m.  FINDINGS: Cardiovascular: Scattered calcification is noted along the thoracic aorta. Slight bulge of the distal aortic arch is stable from 2011. There is no evidence of aortic aneurysm. Scattered calcification is noted along the proximal great vessels. Scattered coronary artery calcifications are seen.  Mediastinum: The mediastinum is otherwise unremarkable in appearance. Visualized mediastinal nodes remain borderline normal in size. No pericardial effusion is seen. The thyroid gland is unremarkable. No axillary lymphadenopathy is seen.  Lungs/Pleura: Bilateral emphysematous change is noted. Peripheral scarring is noted bilaterally, with mild fibrotic change. No masses are identified. There is no evidence of pleural effusion or pneumothorax.  Upper Abdomen: The visualized portions of the liver and spleen are grossly  unremarkable. The gallbladder is relatively decompressed, with suggestion of a tiny stone. The visualized portions of the pancreas and adrenal glands are within normal limits. Nonspecific perinephric stranding is noted bilaterally.  Musculoskeletal: No acute osseous abnormalities are identified. There is mild grade 1 anterolisthesis of C7 on T1. Mild degenerative change is noted along the mid thoracic spine, with endplate sclerosis.  IMPRESSION: 1. Bilateral emphysema noted, with mild fibrotic change and peripheral scarring. 2. Scattered coronary artery calcifications seen. 3. Suggestion of tiny gallstone. Gallbladder contracted and otherwise unremarkable. 4. Mild degenerative change at the mid thoracic spine.   Electronically Signed  By: Garald Balding M.D.  On: 11/16/2015 23:25  Assessment/Plan Present on Admission:  . Acute on chronic respiratory failure (HCC) -Admit to MedSurg with telemetry -Unclear etiology of patient's to respiratory failure. Chest x-ray shows chronic pulmonary fibrosis, BNP 120. stat CT chest also negative for pneumonia or congestive heart failure. Doubt CHF. Patient without the wheezing therefore doubt exacerbation of COPD. Patient has a history for DVT, VQ scan also suboptimal study given patient's history of pulmonary fibrosis. Will start patient on empiric Lovenox treatment dose. Reevaluate in a.m. Will start patient on solumedrol and duonebs and reevaluate in AM. We'll order a d-dimer and ABG. Defer to AM team IF VQ scan or pulmonary consult needed.  COPD   -See above  Pulmonary fibrosis  -See above  . Chronic kidney disease, stage III (moderate) -Stable, resume home medications  . Hyperlipemia -Stable, resume home medications  . Hypertension -Stable, resume home medications  . Type 2 diabetes, controlled, with renal manifestation (HCC) -Stable, ADA diet, sliding-scale insulin   Roise Emert 11/16/2015, 10:41 PM

## 2015-11-16 NOTE — ED Notes (Addendum)
Pt bib EMS w c/o weakness that began this AM and SOB that has been ongoing.  Pt sts he felt weak today, denies pain.  N/V. 96 CBG.  Recently had lasix adjusted from 80 mg to 40 mg

## 2015-11-16 NOTE — ED Notes (Signed)
MD at bedside. 

## 2015-11-17 ENCOUNTER — Encounter: Payer: Self-pay | Admitting: Emergency Medicine

## 2015-11-17 LAB — GLUCOSE, CAPILLARY
GLUCOSE-CAPILLARY: 330 mg/dL — AB (ref 65–99)
GLUCOSE-CAPILLARY: 337 mg/dL — AB (ref 65–99)
GLUCOSE-CAPILLARY: 349 mg/dL — AB (ref 65–99)
Glucose-Capillary: 239 mg/dL — ABNORMAL HIGH (ref 65–99)
Glucose-Capillary: 244 mg/dL — ABNORMAL HIGH (ref 65–99)

## 2015-11-17 LAB — BASIC METABOLIC PANEL
ANION GAP: 7 (ref 5–15)
BUN: 39 mg/dL — AB (ref 6–20)
CHLORIDE: 109 mmol/L (ref 101–111)
CO2: 26 mmol/L (ref 22–32)
Calcium: 9 mg/dL (ref 8.9–10.3)
Creatinine, Ser: 1.58 mg/dL — ABNORMAL HIGH (ref 0.61–1.24)
GFR calc Af Amer: 44 mL/min — ABNORMAL LOW (ref >60)
GFR calc non Af Amer: 38 mL/min — ABNORMAL LOW (ref >60)
Glucose, Bld: 217 mg/dL — ABNORMAL HIGH (ref 65–99)
POTASSIUM: 4.3 mmol/L (ref 3.5–5.1)
SODIUM: 142 mmol/L (ref 135–145)

## 2015-11-17 LAB — BLOOD GAS, ARTERIAL
Acid-Base Excess: 1.6 mmol/L (ref 0.0–3.0)
Bicarbonate: 26.6 mEq/L (ref 21.0–28.0)
FIO2: 0.28
O2 Saturation: 96.5 %
PCO2 ART: 42 mmHg (ref 32.0–48.0)
PH ART: 7.41 (ref 7.350–7.450)
PO2 ART: 85 mmHg (ref 83.0–108.0)
Patient temperature: 37

## 2015-11-17 LAB — CBC
HEMATOCRIT: 45.8 % (ref 40.0–52.0)
HEMOGLOBIN: 15.6 g/dL (ref 13.0–18.0)
MCH: 32.7 pg (ref 26.0–34.0)
MCHC: 34 g/dL (ref 32.0–36.0)
MCV: 96 fL (ref 80.0–100.0)
Platelets: 204 10*3/uL (ref 150–440)
RBC: 4.77 MIL/uL (ref 4.40–5.90)
RDW: 13.2 % (ref 11.5–14.5)
WBC: 6.6 10*3/uL (ref 3.8–10.6)

## 2015-11-17 MED ORDER — METHYLPREDNISOLONE SODIUM SUCC 40 MG IJ SOLR: 40.0000 mg | Freq: Every day | INTRAMUSCULAR | Status: DC

## 2015-11-17 MED ORDER — INSULIN ASPART 100 UNIT/ML ~~LOC~~ SOLN
0.0000 [IU] | Freq: Three times a day (TID) | SUBCUTANEOUS | Status: DC
  Administered 2015-11-17: 11 [IU] via SUBCUTANEOUS
  Administered 2015-11-17: 3 [IU] via SUBCUTANEOUS
  Administered 2015-11-17: 11 [IU] via SUBCUTANEOUS
  Administered 2015-11-18: 3 [IU] via SUBCUTANEOUS
  Administered 2015-11-18: 5 [IU] via SUBCUTANEOUS
  Filled 2015-11-17: qty 5
  Filled 2015-11-17: qty 11
  Filled 2015-11-17: qty 13
  Filled 2015-11-17 (×2): qty 3

## 2015-11-17 MED ORDER — FUROSEMIDE 40 MG PO TABS
80.0000 mg | ORAL_TABLET | Freq: Every day | ORAL | Status: DC
  Administered 2015-11-17 – 2015-11-18 (×2): 80 mg via ORAL
  Filled 2015-11-17 (×2): qty 2

## 2015-11-17 MED ORDER — POLYETHYLENE GLYCOL 3350 17 G PO PACK
17.0000 g | PACK | Freq: Every day | ORAL | Status: DC
  Administered 2015-11-17 – 2015-11-18 (×2): 17 g via ORAL
  Filled 2015-11-17 (×2): qty 1

## 2015-11-17 MED ORDER — ASPIRIN EC 81 MG PO TBEC
81.0000 mg | DELAYED_RELEASE_TABLET | Freq: Every day | ORAL | Status: DC
  Administered 2015-11-17 – 2015-11-18 (×2): 81 mg via ORAL
  Filled 2015-11-17 (×2): qty 1

## 2015-11-17 MED ORDER — PANTOPRAZOLE SODIUM 40 MG PO TBEC
40.0000 mg | DELAYED_RELEASE_TABLET | Freq: Every day | ORAL | Status: DC
  Administered 2015-11-17 – 2015-11-18 (×2): 40 mg via ORAL
  Filled 2015-11-17 (×2): qty 1

## 2015-11-17 MED ORDER — BUDESONIDE 0.25 MG/2ML IN SUSP
0.2500 mg | Freq: Two times a day (BID) | RESPIRATORY_TRACT | Status: DC
  Administered 2015-11-17 – 2015-11-18 (×3): 0.25 mg via RESPIRATORY_TRACT
  Filled 2015-11-17 (×3): qty 2

## 2015-11-17 MED ORDER — ENOXAPARIN SODIUM 40 MG/0.4ML ~~LOC~~ SOLN: 40.0000 mg | SUBCUTANEOUS | Status: DC

## 2015-11-17 MED ORDER — LISINOPRIL 10 MG PO TABS
10.0000 mg | ORAL_TABLET | Freq: Every day | ORAL | Status: DC
  Administered 2015-11-17 – 2015-11-18 (×2): 10 mg via ORAL
  Filled 2015-11-17 (×2): qty 1

## 2015-11-17 MED ORDER — ROSUVASTATIN CALCIUM 20 MG PO TABS
20.0000 mg | ORAL_TABLET | Freq: Every day | ORAL | Status: DC
  Administered 2015-11-17 (×2): 20 mg via ORAL
  Filled 2015-11-17 (×2): qty 1

## 2015-11-17 MED ORDER — TEMAZEPAM 15 MG PO CAPS
30.0000 mg | ORAL_CAPSULE | Freq: Every evening | ORAL | Status: DC | PRN
  Administered 2015-11-17: 30 mg via ORAL
  Filled 2015-11-17: qty 2

## 2015-11-17 MED ORDER — SODIUM CHLORIDE 0.9% FLUSH
3.0000 mL | Freq: Two times a day (BID) | INTRAVENOUS | Status: DC
  Administered 2015-11-17 – 2015-11-18 (×3): 3 mL via INTRAVENOUS

## 2015-11-17 MED ORDER — IPRATROPIUM-ALBUTEROL 0.5-2.5 (3) MG/3ML IN SOLN
3.0000 mL | Freq: Four times a day (QID) | RESPIRATORY_TRACT | Status: DC
  Administered 2015-11-17 – 2015-11-18 (×4): 3 mL via RESPIRATORY_TRACT
  Filled 2015-11-17 (×6): qty 3

## 2015-11-17 MED ORDER — ENOXAPARIN SODIUM 40 MG/0.4ML ~~LOC~~ SOLN
40.0000 mg | Freq: Every day | SUBCUTANEOUS | Status: DC
  Administered 2015-11-17: 40 mg via SUBCUTANEOUS
  Filled 2015-11-17: qty 0.4

## 2015-11-17 MED ORDER — INSULIN ASPART 100 UNIT/ML ~~LOC~~ SOLN
0.0000 [IU] | Freq: Every day | SUBCUTANEOUS | Status: DC
  Administered 2015-11-17: 4 [IU] via SUBCUTANEOUS
  Administered 2015-11-17: 2 [IU] via SUBCUTANEOUS
  Filled 2015-11-17: qty 2
  Filled 2015-11-17: qty 4

## 2015-11-17 MED ORDER — INSULIN DETEMIR 100 UNIT/ML ~~LOC~~ SOLN
25.0000 [IU] | Freq: Two times a day (BID) | SUBCUTANEOUS | Status: DC
  Administered 2015-11-17 – 2015-11-18 (×3): 25 [IU] via SUBCUTANEOUS
  Filled 2015-11-17 (×4): qty 0.25

## 2015-11-17 MED ORDER — IPRATROPIUM-ALBUTEROL 0.5-2.5 (3) MG/3ML IN SOLN: 3.0000 mL | Freq: Four times a day (QID) | RESPIRATORY_TRACT | Status: DC | PRN

## 2015-11-17 NOTE — ED Notes (Signed)
Called 2A to ask about status of bed request. Was informed charge pulled into room, and she would look at status w/in 5 minutes

## 2015-11-17 NOTE — ED Notes (Signed)
Called floor to let them know pt on the way up to floor

## 2015-11-17 NOTE — Progress Notes (Signed)
Patient stated to the RN that he takes Temazepam 30 mg by mouth as needed  for sleep. Dr. Marcille Blanco notified and he initiated the PRN medication. The medication will be implemented as ordered. Will continue to monitor.

## 2015-11-17 NOTE — Progress Notes (Signed)
Patient is admitted to room 241 with the diagnosis of acute on chronic respiratory failure. Alert and oriented x 4. Denied any acute pain. Noted dyspnea with exertion. Patient oriented to the staff, call bell/ascom. Tele box called to CCMD with a second verifier Daniell NT.  Fall contract signed, bed alarm on, and the bed is  in the lowest position. Skin assessment done with Cassie RN, no skin condition of concern noted. Will continue to monitor.

## 2015-11-17 NOTE — Progress Notes (Signed)
Patient ID: Miguel Huber, male   DOB: 18-Oct-1927, 82 y.o.   MRN: [id number] [facility] PROGRESS NOTE  Miguel Huber [id number] DOB: 01/11/1928 DOA: 11/16/2015 PCP: Viviana Simpler, MD  HPI/Subjective: Patient breathing better today than he did when he came in. He was hoping to go home today. Still has a little bit of shortness of breath. Patient states that his kidney doctor recently cut back on his Lasix dose and he thinks that's the reason he is in the hospital.  Objective: Filed Vitals:   11/17/15 0758 11/17/15 1149  BP: 128/74 113/76  Pulse: 87 89  Temp: 98.5 F (36.9 C) 98 F (36.7 C)  Resp: 20 17    Filed Weights   11/16/15 1854 11/17/15 0039  Weight: 86.183 kg (190 lb) 85.866 kg (189 lb 4.8 oz)    ROS: Review of Systems  Constitutional: Negative for fever and chills.  Eyes: Negative for blurred vision.  Respiratory: Positive for cough and shortness of breath.   Cardiovascular: Negative for chest pain.  Gastrointestinal: Negative for nausea, vomiting, abdominal pain, diarrhea and constipation.  Genitourinary: Negative for dysuria.  Musculoskeletal: Negative for joint pain.  Neurological: Negative for dizziness and headaches.   Exam: Physical Exam  Constitutional: He is oriented to person, place, and time.  HENT:  Nose: No mucosal edema.  Mouth/Throat: No oropharyngeal exudate or posterior oropharyngeal edema.  Eyes: Conjunctivae, EOM and lids are normal. Pupils are equal, round, and reactive to light.  Neck: No JVD present. Carotid bruit is not present. No edema present. No thyroid mass and no thyromegaly present.  Cardiovascular: S1 normal and S2 normal.  Exam reveals no gallop.   No murmur heard. Pulses:      Dorsalis pedis pulses are 2+ on the right side, and 2+ on the left side.  Respiratory: No respiratory distress. He has decreased breath sounds in the right lower field and the left lower field. He has no wheezes. He has no rhonchi. He has no  rales.  GI: Soft. Bowel sounds are normal. There is no tenderness.  Musculoskeletal:       Right ankle: He exhibits swelling.       Left ankle: He exhibits swelling.  Lymphadenopathy:    He has no cervical adenopathy.  Neurological: He is alert and oriented to person, place, and time. No cranial nerve deficit.  Skin: Skin is warm. No rash noted. Nails show no clubbing.  Psychiatric: He has a normal mood and affect.      Data Reviewed: Basic Metabolic Panel:  Recent Labs Lab 11/16/15 1922 11/17/15 0537  NA 142 142  K 3.9 4.3  CL 110 109  CO2 22 26  GLUCOSE 111* 217*  BUN 36* 39*  CREATININE 1.63* 1.58*  CALCIUM 8.8* 9.0   Liver Function Tests:  Recent Labs Lab 11/16/15 1922  AST 23  ALT 19  ALKPHOS 84  BILITOT 1.1  PROT 7.0  ALBUMIN 3.9   CBC:  Recent Labs Lab 11/16/15 1922 11/17/15 0537  WBC 9.1 6.6  HGB 14.9 15.6  HCT 43.8 45.8  MCV 96.2 96.0  PLT 197 204   Cardiac Enzymes:  Recent Labs Lab 11/16/15 1922  TROPONINI <0.03   BNP (last 3 results)  Recent Labs  08/06/15 1351 11/16/15 1922  BNP 18.0 120.0*    CBG:  Recent Labs Lab 11/17/15 0051 11/17/15 0809 11/17/15 1150  GLUCAP 239* 244* 330*      Studies: Dg Chest 2 View  11/16/2015  CLINICAL  DATA:  Shortness of breath EXAM: CHEST  2 VIEW COMPARISON:  08/06/2015 chest radiograph. FINDINGS: Stable cardiomediastinal silhouette with top-normal heart size. No pneumothorax. No pleural effusion. Prominent reticular interstitial opacities throughout the peripheral basilar lungs, not appreciably changed. No pulmonary edema or superimposed acute consolidative airspace disease. IMPRESSION: No appreciable change in prominent peripheral basilar predominant interstitial opacities suggesting chronic interstitial lung disease. No acute consolidative airspace disease to suggest a superimposed pneumonia . Electronically Signed   By: Ilona Sorrel M.D.   On: 11/16/2015 19:26   Ct Chest Wo  Contrast  11/16/2015  CLINICAL DATA:  Acute onset of difficulty breathing, nausea and generalized weakness. Shortness of breath. Initial encounter. EXAM: CT CHEST WITHOUT CONTRAST TECHNIQUE: Multidetector CT imaging of the chest was performed following the standard protocol without IV contrast. COMPARISON:  Chest radiograph performed earlier today at 6:56 p.m. FINDINGS: Cardiovascular: Scattered calcification is noted along the thoracic aorta. Slight bulge of the distal aortic arch is stable from 2011. There is no evidence of aortic aneurysm. Scattered calcification is noted along the proximal great vessels. Scattered coronary artery calcifications are seen. Mediastinum: The mediastinum is otherwise unremarkable in appearance. Visualized mediastinal nodes remain borderline normal in size. No pericardial effusion is seen. The thyroid gland is unremarkable. No axillary lymphadenopathy is seen. Lungs/Pleura: Bilateral emphysematous change is noted. Peripheral scarring is noted bilaterally, with mild fibrotic change. No masses are identified. There is no evidence of pleural effusion or pneumothorax. Upper Abdomen: The visualized portions of the liver and spleen are grossly unremarkable. The gallbladder is relatively decompressed, with suggestion of a tiny stone. The visualized portions of the pancreas and adrenal glands are within normal limits. Nonspecific perinephric stranding is noted bilaterally. Musculoskeletal: No acute osseous abnormalities are identified. There is mild grade 1 anterolisthesis of C7 on T1. Mild degenerative change is noted along the mid thoracic spine, with endplate sclerosis. IMPRESSION: 1. Bilateral emphysema noted, with mild fibrotic change and peripheral scarring. 2. Scattered coronary artery calcifications seen. 3. Suggestion of tiny gallstone. Gallbladder contracted and otherwise unremarkable. 4. Mild degenerative change at the mid thoracic spine. Electronically Signed   By: Garald Balding  M.D.   On: 11/16/2015 23:25    Scheduled Meds: . aspirin EC  81 mg Oral Daily  . budesonide (PULMICORT) nebulizer solution  0.25 mg Nebulization BID  . enoxaparin (LOVENOX) injection  40 mg Subcutaneous QHS  . furosemide  80 mg Oral Daily  . insulin aspart  0-15 Units Subcutaneous TID WC  . insulin aspart  0-5 Units Subcutaneous QHS  . insulin detemir  25 Units Subcutaneous BID  . ipratropium-albuterol  3 mL Nebulization Q6H  . lisinopril  10 mg Oral Daily  . [START ON 11/18/2015] methylPREDNISolone (SOLU-MEDROL) injection  40 mg Intravenous Daily  . pantoprazole  40 mg Oral QAC breakfast  . polyethylene glycol  17 g Oral Daily  . rosuvastatin  20 mg Oral QHS  . sodium chloride flush  3 mL Intravenous Q12H    Assessment/Plan:  1. Acute on chronic respiratory failure. Patient was tapered from 4 L down to 3 L hopefully we will get him back to his chronic 2 L nasal cannula. 2. Acute CHF. Echocardiogram to determine EF. Lasix iv yesterday, Lasix 80mg  orally today 3. COPD exacerbation- decrease solumedrol, nebulizers 4. Type 2 diabetes- sugars high while on steroids 5. Essential HTN- continue meds 6. Hyperlipidemia- on crestor    Code Status:     Code Status Orders  Start     Ordered   11/17/15 0043  Full code   Continuous     11/17/15 0042    Code Status History    Date Active Date Inactive Code Status Order ID Comments User Context   This patient has a current code status but no historical code status.     Family Communication: Family at bedside Disposition Plan: Potentially home tomorrow  Time spent: 19minutes  Loletha Grayer  [employer]

## 2015-11-18 ENCOUNTER — Inpatient Hospital Stay (HOSPITAL_COMMUNITY)
Admit: 2015-11-18 | Discharge: 2015-11-18 | Disposition: A | Discharge status: Home / Self Care | Payer: Medicare Other | Attending: Internal Medicine | Admitting: Internal Medicine

## 2015-11-18 DIAGNOSIS — I509 Heart failure, unspecified: Secondary | ICD-10-CM

## 2015-11-18 LAB — BASIC METABOLIC PANEL
ANION GAP: 10 (ref 5–15)
BUN: 54 mg/dL — AB (ref 6–20)
CALCIUM: 8.5 mg/dL — AB (ref 8.9–10.3)
CO2: 24 mmol/L (ref 22–32)
CREATININE: 1.99 mg/dL — AB (ref 0.61–1.24)
Chloride: 107 mmol/L (ref 101–111)
GFR calc Af Amer: 34 mL/min — ABNORMAL LOW (ref >60)
GFR calc non Af Amer: 29 mL/min — ABNORMAL LOW (ref >60)
GLUCOSE: 221 mg/dL — AB (ref 65–99)
Potassium: 4.1 mmol/L (ref 3.5–5.1)
Sodium: 141 mmol/L (ref 135–145)

## 2015-11-18 LAB — GLUCOSE, CAPILLARY
GLUCOSE-CAPILLARY: 207 mg/dL — AB (ref 65–99)
Glucose-Capillary: 198 mg/dL — ABNORMAL HIGH (ref 65–99)

## 2015-11-18 MED ORDER — PREDNISONE 5 MG PO TABS: ORAL_TABLET | ORAL | Status: DC

## 2015-11-18 MED ORDER — PREDNISONE 10 MG PO TABS
30.0000 mg | ORAL_TABLET | Freq: Every day | ORAL | Status: DC
Start: 2015-11-18 — End: 2015-11-18
  Administered 2015-11-18: 30 mg via ORAL
  Filled 2015-11-18: qty 3

## 2015-11-18 MED ORDER — ENOXAPARIN SODIUM 30 MG/0.3ML ~~LOC~~ SOLN: 30.0000 mg | Freq: Every day | SUBCUTANEOUS | Status: DC

## 2015-11-18 NOTE — Care Management Note (Signed)
Case Management Note  Patient Details  Name: Miguel Huber MRN: [id number] Date of Birth: 1927/08/25  Subjective/Objective:    Per daughter Miguel Huber, Miguel Huber resides with her. She has his portable oxygen tank in the car with her today for use during transport home today.                 Action/Plan:   Expected Discharge Date:                  Expected Discharge Plan:     In-House Referral:     Discharge planning Services     Post Acute Care Choice:    Choice offered to:     DME Arranged:    DME Agency:     HH Arranged:    HH Agency:     Status of Service:     If discussed at [employer] of Stay Meetings, dates discussed:    Additional Comments:  Miguel Huber A, RN 11/18/2015, 10:19 AM

## 2015-11-18 NOTE — Discharge Summary (Signed)
[place] [facility] [place] NAME: Miguel Huber    MR#:  [id number]  DATE OF BIRTH:  04-10-1928  DATE OF ADMISSION:  11/16/2015 ADMITTING PHYSICIAN: Quintella Baton, MD  DATE OF DISCHARGE: 11/18/2015  1:35 PM  PRIMARY CARE PHYSICIAN: Viviana Simpler, MD    ADMISSION DIAGNOSIS:  Dyspnea [R06.00] SOB (shortness of breath) [R06.02] Acute on chronic congestive heart failure, unspecified congestive heart failure type ([place]) [I50.9]  DISCHARGE DIAGNOSIS:  Principal Problem:   Acute on chronic respiratory failure (HCC) Active Problems:   Hyperlipemia   Chronic kidney disease, stage III (moderate)   Type 2 diabetes, controlled, with renal manifestation (HCC)   Hypertension   COPD (chronic obstructive pulmonary disease) (HCC)   Pulmonary fibrosis (HCC)   History of DVT (deep vein thrombosis)   Cardiomyopathy ([place])   SECONDARY DIAGNOSIS:   Past Medical History  Diagnosis Date  . Allergic rhinitis   . Hx of adenomatous colonic polyps   . Pulmonary fibrosis ([place])   . COPD (chronic obstructive pulmonary disease) ([place])   . GERD (gastroesophageal reflux disease)   . Erectile dysfunction   . Cardiomyopathy   . Hyperlipidemia   . Renal insufficiency   . Osteoarthritis   . Diabetes mellitus   . Hypertension   . DVT (deep venous thrombosis), right 5/13    HOSPITAL COURSE:   1. Acute on chronic respiratory failure. Patient was up at 4 L of oxygen and then taper down to 3 L of oxygen. Patient breathing better than when he came in. 2. COPD exacerbation. Patient started on Solu-Medrol and nebulizer treatments. Patient's wheezing has improved and lessened. I will give a prednisone taper upon discharge home. 3. Acute congestive heart failure. Echocardiogram not read at the time of discharge or even at the time of this discharge summary. I did not prescribe a beta blocker secondary to the patient's COPD exacerbation and bronchospasm. The patient received  a dose of IV Lasix while here and switch back to his normal dose of 80 mg of Lasix orally daily. The patient is already on an ACE inhibitor. Follow up at the CHF clinic. Consideration of beta blocker can be done as outpatient when breathing better. 4. Chronic kidney disease stage IV. Follow-up with nephrology as outpatient 5. Type 2 diabetes. Sugars will be high while on steroids 6. Essential hypertension continue usual medication 7. Hyperlipidemia on Crestor  DISCHARGE CONDITIONS:   Satisfactory  CONSULTS OBTAINED:  None  DRUG ALLERGIES:   Allergies  Allergen Reactions  . Theophyllines Hives    DISCHARGE MEDICATIONS:   Discharge Medication List as of 11/18/2015 11:36 AM    START taking these medications   Details  predniSONE (DELTASONE) 5 MG tablet 5 tablets by mouth day 1; 4 tablets by mouth day 2; 3 tablets by mouth day 3; 2 tablets by mouth day 4; 1 tablet by mouth daily 5., Print      CONTINUE these medications which have NOT CHANGED   Details  aspirin EC 81 MG tablet Take 81 mg by mouth daily., Until Discontinued, Historical Med    fluocinonide cream (LIDEX) [id number] % Apply 1 application topically 2 (two) times daily., Until Discontinued, Historical Med    fluticasone furoate-vilanterol (BREO ELLIPTA) 100-25 MCG/INH AEPB Inhale 1 puff into the lungs daily., Until Discontinued, Historical Med    furosemide (LASIX) 80 MG tablet Take 80 mg by mouth daily., Until Discontinued, Historical Med    insulin NPH Human (HUMULIN N,NOVOLIN N) 100 UNIT/ML injection  Inject 25 Units into the skin 2 (two) times daily. , Until Discontinued, Historical Med    insulin regular (NOVOLIN R,HUMULIN R) 100 units/mL injection Inject 10 Units into the skin 2 (two) times daily before a meal., Until Discontinued, Historical Med    ipratropium-albuterol (DUONEB) 0.5-2.5 (3) MG/3ML SOLN Take 3 mLs by nebulization every 6 (six) hours as needed (for wheezing/shortness of breath). , Until Discontinued,  Historical Med    lisinopril (PRINIVIL,ZESTRIL) 10 MG tablet Take 10 mg by mouth daily., Until Discontinued, Historical Med    omeprazole (PRILOSEC) 20 MG capsule Take 20 mg by mouth daily., Until Discontinued, Historical Med    polyethylene glycol (MIRALAX / GLYCOLAX) packet Take 17 g by mouth daily., Until Discontinued, Historical Med    rosuvastatin (CRESTOR) 20 MG tablet Take 20 mg by mouth at bedtime., Until Discontinued, Historical Med    temazepam (RESTORIL) 30 MG capsule Take 30 mg by mouth at bedtime as needed for sleep., Until Discontinued, Historical Med         DISCHARGE INSTRUCTIONS:   Follow-up PMD one week  If you experience worsening of your admission symptoms, develop shortness of breath, life threatening emergency, suicidal or homicidal thoughts you must seek medical attention immediately by calling 911 or calling your MD immediately  if symptoms less severe.  You Must read complete instructions/literature along with all the possible adverse reactions/side effects for all the Medicines you take and that have been prescribed to you. Take any new Medicines after you have completely understood and accept all the possible adverse reactions/side effects.   Please note  You were cared for by a hospitalist during your hospital stay. If you have any questions about your discharge medications or the care you received while you were in the hospital after you are discharged, you can call the unit and asked to speak with the hospitalist on call if the hospitalist that took care of you is not available. Once you are discharged, your primary care physician will handle any further medical issues. Please note that NO REFILLS for any discharge medications will be authorized once you are discharged, as it is imperative that you return to your primary care physician (or establish a relationship with a primary care physician if you do not have one) for your aftercare needs so that they can  reassess your need for medications and monitor your lab values.    Today   CHIEF COMPLAINT:   Chief Complaint  Patient presents with  . Shortness of Breath  . Weakness    HISTORY OF PRESENT ILLNESS:  Miguel Huber  is a 82 y.o. male presented with shortness of breath and weakness.   VITAL SIGNS:  Blood pressure 115/63, pulse 73, temperature 97.8 F (36.6 C), temperature source Oral, resp. rate 18, height 5' 7.5" (1.715 m), weight 85.866 kg (189 lb 4.8 oz), SpO2 93 %.    PHYSICAL EXAMINATION:  GENERAL:  82 y.o.-year-old patient lying in the bed with no acute distress.  EYES: Pupils equal, round, reactive to light and accommodation. No scleral icterus. Extraocular muscles intact.  HEENT: Head atraumatic, normocephalic. Oropharynx and nasopharynx clear.  NECK:  Supple, no jugular venous distention. No thyroid enlargement, no tenderness.  LUNGS: Decreased breath sounds bilaterally, no wheezing, rales,rhonchi or crepitation. No use of accessory muscles of respiration.  CARDIOVASCULAR: S1, S2 normal. No murmurs, rubs, or gallops.  ABDOMEN: Soft, non-tender, non-distended. Bowel sounds present. No organomegaly or mass.  EXTREMITIES: 2+ pedal edema. No cyanosis, or clubbing.  NEUROLOGIC: Cranial nerves II through XII are intact. Muscle strength 5/5 in all extremities. Sensation intact. Gait not checked.  PSYCHIATRIC: The patient is alert and oriented x 3.  SKIN: No obvious rash, lesion, or ulcer.   DATA REVIEW:   CBC  Recent Labs Lab 11/17/15 0537  WBC 6.6  HGB 15.6  HCT 45.8  PLT 204    Chemistries   Recent Labs Lab 11/16/15 1922  11/18/15 0449  NA 142  < > 141  K 3.9  < > 4.1  CL 110  < > 107  CO2 22  < > 24  GLUCOSE 111*  < > 221*  BUN 36*  < > 54*  CREATININE 1.63*  < > 1.99*  CALCIUM 8.8*  < > 8.5*  AST 23  --   --   ALT 19  --   --   ALKPHOS 84  --   --   BILITOT 1.1  --   --   < > = values in this interval not displayed.  Cardiac Enzymes  Recent  Labs Lab 11/16/15 1922  TROPONINI <0.03    Microbiology Results  Results for orders placed or performed during the hospital encounter of 08/06/15  Rapid Influenza A&B Antigens ([place] only)     Status: None   Collection Time: 08/06/15  4:30 PM  Result Value Ref Range Status   Influenza A ([facility]) NEGATIVE NEGATIVE Final   Influenza B [facility]) NEGATIVE NEGATIVE Final    RADIOLOGY:  Dg Chest 2 View  11/16/2015  CLINICAL DATA:  Shortness of breath EXAM: CHEST  2 VIEW COMPARISON:  08/06/2015 chest radiograph. FINDINGS: Stable cardiomediastinal silhouette with top-normal heart size. No pneumothorax. No pleural effusion. Prominent reticular interstitial opacities throughout the peripheral basilar lungs, not appreciably changed. No pulmonary edema or superimposed acute consolidative airspace disease. IMPRESSION: No appreciable change in prominent peripheral basilar predominant interstitial opacities suggesting chronic interstitial lung disease. No acute consolidative airspace disease to suggest a superimposed pneumonia . Electronically Signed   By: Ilona Sorrel M.D.   On: 11/16/2015 19:26   Ct Chest Wo Contrast  11/16/2015  CLINICAL DATA:  Acute onset of difficulty breathing, nausea and generalized weakness. Shortness of breath. Initial encounter. EXAM: CT CHEST WITHOUT CONTRAST TECHNIQUE: Multidetector CT imaging of the chest was performed following the standard protocol without IV contrast. COMPARISON:  Chest radiograph performed earlier today at 6:56 p.m. FINDINGS: Cardiovascular: Scattered calcification is noted along the thoracic aorta. Slight bulge of the distal aortic arch is stable from 2011. There is no evidence of aortic aneurysm. Scattered calcification is noted along the proximal great vessels. Scattered coronary artery calcifications are seen. Mediastinum: The mediastinum is otherwise unremarkable in appearance. Visualized mediastinal nodes remain borderline normal in size. No pericardial  effusion is seen. The thyroid gland is unremarkable. No axillary lymphadenopathy is seen. Lungs/Pleura: Bilateral emphysematous change is noted. Peripheral scarring is noted bilaterally, with mild fibrotic change. No masses are identified. There is no evidence of pleural effusion or pneumothorax. Upper Abdomen: The visualized portions of the liver and spleen are grossly unremarkable. The gallbladder is relatively decompressed, with suggestion of a tiny stone. The visualized portions of the pancreas and adrenal glands are within normal limits. Nonspecific perinephric stranding is noted bilaterally. Musculoskeletal: No acute osseous abnormalities are identified. There is mild grade 1 anterolisthesis of C7 on T1. Mild degenerative change is noted along the mid thoracic spine, with endplate sclerosis. IMPRESSION: 1. Bilateral emphysema noted, with mild fibrotic change and peripheral scarring.  2. Scattered coronary artery calcifications seen. 3. Suggestion of tiny gallstone. Gallbladder contracted and otherwise unremarkable. 4. Mild degenerative change at the mid thoracic spine. Electronically Signed   By: Garald Balding M.D.   On: 11/16/2015 23:25    Management plans discussed with the patient, family and they are in agreement.  CODE STATUS:  Code Status History    Date Active Date Inactive Code Status Order ID Comments User Context   11/17/2015 12:42 AM 11/18/2015  4:36 PM Full Code [id number]  Quintella Baton, MD Inpatient      TOTAL TIME TAKING CARE OF THIS PATIENT: 35 minutes.    Loletha Grayer M.D on 11/18/2015 at 4:44 PM  Between 7am to 6pm - Pager - [phone]  After 6pm go to [email or web address] - password [email or web address]  [facility] Office  [phone]  CC: Primary care physician; Viviana Simpler, MD

## 2015-11-18 NOTE — Progress Notes (Signed)
Anticoagulation monitoring(Lovenox):  82 yo M  ordered Lovenox 40 mg Q24h  Filed Weights   11/16/15 1854 11/17/15 0039  Weight: 190 lb (86.183 kg) 189 lb 4.8 oz (85.866 kg)   BMI 29.3  Lab Results  Component Value Date   CREATININE 1.99* 11/18/2015   CREATININE 1.58* 11/17/2015   CREATININE 1.63* 11/16/2015   Estimated Creatinine Clearance: 28.7 mL/min (by C-G formula based on Cr of 1.99). Hemoglobin & Hematocrit     Component Value Date/Time   HGB 15.6 11/17/2015 0537   HGB 15.9 08/07/2012 1637   HCT 45.8 11/17/2015 0537   HCT 46.2 08/07/2012 1637     Per Protocol for Patient with estCrcl< 30 ml/min and BMI < 40, will transition to Lovenox 30 mg Q24hQ.     Ulice Dash, PharmD Clinical Pharmacist

## 2015-11-18 NOTE — Progress Notes (Signed)
*  PRELIMINARY RESULTS* Echocardiogram 2D Echocardiogram has been performed.  Sherrie Sport 11/18/2015, 8:24 AM

## 2015-11-18 NOTE — Progress Notes (Signed)
Pt to be discharged to home this afternoon. 02 weaned down to 2 l. N.c. Iv and tele removed. disch instructions given to pt and family to their understanding. disch via w.c. On 02 accompanied by family

## 2015-11-19 ENCOUNTER — Telehealth: Payer: Self-pay

## 2015-11-19 LAB — ECHOCARDIOGRAM COMPLETE
AV Peak grad: 8 mmHg
AV pk vel: 139 cm/s
AVAREAVTI: 2.61 cm2
Ao pk vel: 0.76 m/s
CHL CUP AV PEAK INDEX: 1.31
CHL CUP MV DEC (S): 292
EERAT: 17.26
EWDT: 292 ms
FS: 46 % — AB (ref 28–44)
IV/PV OW: 1.2
LA diam end sys: 31 mm
LA vol A4C: 30.5 ml
LADIAMINDEX: 1.56 cm/m2
LASIZE: 31 mm
LAVOL: 42.7 mL
LAVOLIN: 21.5 mL/m2
LDCA: 3.46 cm2
LV E/e'average: 17.26
LV PW d: 11.4 mm — AB (ref 0.6–1.1)
LVEEMED: 17.26
LVELAT: 5 cm/s
LVOT peak vel: 105 cm/s
LVOTD: 21 mm
MV Peak grad: 3 mmHg
MVPKAVEL: 134 m/s
MVPKEVEL: 86.3 m/s
TAPSE: 17.1 mm
TDI e' lateral: 5
TDI e' medial: 5.66
WEIGHTICAEL: 3028.8 [oz_av]

## 2015-11-19 NOTE — Telephone Encounter (Signed)
1st attempt to contact patient for TCM.  Transition Care Management Follow-up Telephone Call     Date discharged? 11/18/2015        How have you been since you were released from the hospital? Health status improving   Any patient concerns? No   Do you understand why you were in the hospital? Yes   Do you understand the discharge instructions? Yes   Where were you discharged to? Home   Items Reviewed:  Medications reviewed: Yes  Allergies reviewed: Yes  Dietary changes reviewed: N/A  Referrals reviewed: N/A   Functional Questionnaire:  Independent - I Dependent - D    Activities of Daily Living (ADLs):  Pt is independent with all ADLs except ambulation. Pt uses a walker as needed when leaving home setting.   Personal hygiene  Dressing Eating  Maintaining continence Transferring  Independent Activities of Daily Living (ADLs): Pt is independent with all iADLs.   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  Patient was given the office number and encouraged to call back with question or concerns: YES

## 2015-11-26 ENCOUNTER — Encounter: Payer: Self-pay | Admitting: Internal Medicine

## 2015-11-26 ENCOUNTER — Ambulatory Visit (INDEPENDENT_AMBULATORY_CARE_PROVIDER_SITE_OTHER): Payer: Medicare Other | Admitting: Internal Medicine

## 2015-11-26 VITALS — BP 90/60 | HR 74 | Temp 97.7°F | Resp 30 | Wt 189.0 lb

## 2015-11-26 DIAGNOSIS — N184 Chronic kidney disease, stage 4 (severe): Secondary | ICD-10-CM | POA: Diagnosis not present

## 2015-11-26 DIAGNOSIS — I5032 Chronic diastolic (congestive) heart failure: Secondary | ICD-10-CM

## 2015-11-26 DIAGNOSIS — J449 Chronic obstructive pulmonary disease, unspecified: Secondary | ICD-10-CM

## 2015-11-26 LAB — RENAL FUNCTION PANEL
Albumin: 4 g/dL (ref 3.5–5.2)
BUN: 62 mg/dL — ABNORMAL HIGH (ref 6–23)
CALCIUM: 9.8 mg/dL (ref 8.4–10.5)
CO2: 28 meq/L (ref 19–32)
Chloride: 104 mEq/L (ref 96–112)
Creatinine, Ser: 1.98 mg/dL — ABNORMAL HIGH (ref 0.40–1.50)
GFR: 34.26 mL/min — AB (ref >60.00)
Glucose, Bld: 149 mg/dL — ABNORMAL HIGH (ref 70–99)
POTASSIUM: 4.5 meq/L (ref 3.5–5.1)
Phosphorus: 3.6 mg/dL (ref 2.3–4.6)
Sodium: 141 mEq/L (ref 135–145)

## 2015-11-26 MED ORDER — INSULIN REGULAR HUMAN 100 UNIT/ML IJ SOLN: 10.0000 [IU] | Freq: Two times a day (BID) | INTRAMUSCULAR | Status: DC

## 2015-11-26 MED ORDER — TEMAZEPAM 30 MG PO CAPS: 30.0000 mg | ORAL_CAPSULE | Freq: Every evening | ORAL | Status: DC | PRN

## 2015-11-26 MED ORDER — INSULIN NPH (HUMAN) (ISOPHANE) 100 UNIT/ML ~~LOC~~ SUSP: 25.0000 [IU] | Freq: Two times a day (BID) | SUBCUTANEOUS | Status: DC

## 2015-11-26 NOTE — Patient Instructions (Signed)
Please weigh yourself daily. Let me know if your weight is up over 3# from your usual--you may need extra furosemide (lasix).

## 2015-11-26 NOTE — Assessment & Plan Note (Signed)
Exacerbated after decrease in furosemide Will keep up with 80 daily Asked him to start checking weight qAM Reviewed echo from hospital--systolic function is fine

## 2015-11-26 NOTE — Addendum Note (Signed)
Addended by: Pilar Grammes on: 11/26/2015 02:35 PM   Modules accepted: Orders

## 2015-11-26 NOTE — Assessment & Plan Note (Signed)
With hypotension at times Prompted the diuretic decrease--but didn't work out Will recheck now

## 2015-11-26 NOTE — Assessment & Plan Note (Signed)
He doesn't feel this was exacerbated

## 2015-11-26 NOTE — Progress Notes (Signed)
Pre visit review using our clinic review tool, if applicable. No additional management support is needed unless otherwise documented below in the visit note. 

## 2015-11-26 NOTE — Progress Notes (Signed)
Subjective:    Patient ID: Miguel Huber, male    DOB: 21-Jan-1928, 82 y.o.   MRN: [id number]  HPI Here for hospital follow up  Went to kidney doctor-- BP was too low (Dr Juleen China) Cut the fluid pill in half-- from 80 to 40mg  Within a few days, things worsened He started having SOB and dizziness---called 911 Admitted and diuresed Feeling considerably better now He no longer wants to go back to nephrologist  Breathing not back to baseline No fever or cough--he doesn't feel there was any component of COPD worsening  Not weighing himself No chest pain Does notice his heart beat if he pushes it--no clear palpitations No dizziness  Current Outpatient Prescriptions on File Prior to Visit  Medication Sig Dispense Refill  . aspirin EC 81 MG tablet Take 81 mg by mouth daily.    . fluocinonide cream (LIDEX) [id number] % Apply 1 application topically 2 (two) times daily.    . fluticasone furoate-vilanterol (BREO ELLIPTA) 100-25 MCG/INH AEPB Inhale 1 puff into the lungs daily.    . furosemide (LASIX) 80 MG tablet Take 80 mg by mouth daily.    . insulin NPH Human (HUMULIN N,NOVOLIN N) 100 UNIT/ML injection Inject 25 Units into the skin 2 (two) times daily.     . insulin regular (NOVOLIN R,HUMULIN R) 100 units/mL injection Inject 10 Units into the skin 2 (two) times daily before a meal.    . ipratropium-albuterol (DUONEB) 0.5-2.5 (3) MG/3ML SOLN Take 3 mLs by nebulization every 6 (six) hours as needed (for wheezing/shortness of breath).     Marland Kitchen lisinopril (PRINIVIL,ZESTRIL) 10 MG tablet Take 10 mg by mouth daily.    Marland Kitchen omeprazole (PRILOSEC) 20 MG capsule Take 20 mg by mouth daily.    . polyethylene glycol (MIRALAX / GLYCOLAX) packet Take 17 g by mouth daily.    . predniSONE (DELTASONE) 5 MG tablet 5 tablets by mouth day 1; 4 tablets by mouth day 2; 3 tablets by mouth day 3; 2 tablets by mouth day 4; 1 tablet by mouth daily 5. 15 tablet 0  . rosuvastatin (CRESTOR) 20 MG tablet Take 20 mg by mouth at  bedtime.    . temazepam (RESTORIL) 30 MG capsule Take 30 mg by mouth at bedtime as needed for sleep.     No current facility-administered medications on file prior to visit.    Allergies  Allergen Reactions  . Theophyllines Hives    Past Medical History  Diagnosis Date  . Allergic rhinitis   . Hx of adenomatous colonic polyps   . Pulmonary fibrosis ([place])   . COPD (chronic obstructive pulmonary disease) ([place])   . GERD (gastroesophageal reflux disease)   . Erectile dysfunction   . Cardiomyopathy   . Hyperlipidemia   . Renal insufficiency   . Osteoarthritis   . Diabetes mellitus   . Hypertension   . DVT (deep venous thrombosis), right 5/13    Past Surgical History  Procedure Laterality Date  . Orif clavicle fracture      Left clavicle fracture- child  . Orif ulnar fracture      Left readius/ ulna fracture child  . Lung biopsy      Right lung bx. negative 2001  . Cataract extraction      Cataracts/ IOL OU, [place] 2005, [place] ?  2000  . Cardiovascular stress test      Myoview stress negative EF 65-70% 08/04    Family History  Problem Relation Age of Onset  .  Cancer Neg Hx     Social History   Social History  . Marital Status: Widowed    Spouse Name: N/A  . Number of Children: 3  . Years of Education: N/A   Occupational History  . retired     IT consultant   Social History Main Topics  . Smoking status: Former Research scientist (life sciences)  . Smokeless tobacco: Never Used  . Alcohol Use: No  . Drug Use: No  . Sexual Activity: Not on file   Other Topics Concern  . Not on file   Social History Narrative   Has platonic lady lfriend since ~2008  [facility]. Currently in nursing home after stroke.      Has living will now   No health care POA--has 3 children   Would like to try resuscitation--would accept ventilator only temporarily   Might accept tube feeds if cognitively unaware   Review of Systems  Appetite is good Sleep is still not good--despite the  temazepam. Sleeps in bed--with [facility] elevated. No PND--just has nocturia AM sugars usually in low 100's---evening 132-309 (when he cut insulin in AM)     Objective:   Physical Exam  Constitutional: He appears well-developed. No distress.  Neck: No thyromegaly present.  Cardiovascular: Normal rate and regular rhythm.  Exam reveals no gallop.   No murmur heard. Pulmonary/Chest:  Decreased breath sounds and some rhonchi  Musculoskeletal: He exhibits no edema.  Lymphadenopathy:    He has no cervical adenopathy.  Psychiatric: He has a normal mood and affect. His behavior is normal.          Assessment & Plan:

## 2015-11-28 DIAGNOSIS — X32XXXA Exposure to sunlight, initial encounter: Secondary | ICD-10-CM | POA: Diagnosis not present

## 2015-11-28 DIAGNOSIS — Z85828 Personal history of other malignant neoplasm of skin: Secondary | ICD-10-CM | POA: Diagnosis not present

## 2015-11-28 DIAGNOSIS — C44212 Basal cell carcinoma of skin of right ear and external auricular canal: Secondary | ICD-10-CM | POA: Diagnosis not present

## 2015-11-28 DIAGNOSIS — D485 Neoplasm of uncertain behavior of skin: Secondary | ICD-10-CM | POA: Diagnosis not present

## 2015-11-28 DIAGNOSIS — Z08 Encounter for follow-up examination after completed treatment for malignant neoplasm: Secondary | ICD-10-CM | POA: Diagnosis not present

## 2015-11-28 DIAGNOSIS — L570 Actinic keratosis: Secondary | ICD-10-CM | POA: Diagnosis not present

## 2015-11-29 ENCOUNTER — Other Ambulatory Visit: Payer: Self-pay

## 2015-11-29 MED ORDER — OMEPRAZOLE 20 MG PO CPDR: 20.0000 mg | DELAYED_RELEASE_CAPSULE | Freq: Every day | ORAL | Status: DC

## 2015-11-29 NOTE — Telephone Encounter (Signed)
Rx sent electronically.  

## 2015-12-05 DIAGNOSIS — B351 Tinea unguium: Secondary | ICD-10-CM | POA: Diagnosis not present

## 2015-12-05 DIAGNOSIS — E1142 Type 2 diabetes mellitus with diabetic polyneuropathy: Secondary | ICD-10-CM | POA: Diagnosis not present

## 2015-12-05 DIAGNOSIS — L851 Acquired keratosis [keratoderma] palmaris et plantaris: Secondary | ICD-10-CM | POA: Diagnosis not present

## 2015-12-07 ENCOUNTER — Encounter: Payer: Self-pay | Admitting: Family

## 2015-12-07 ENCOUNTER — Ambulatory Visit: Payer: Medicare Other | Attending: Family | Admitting: Family

## 2015-12-07 VITALS — BP 99/52 | HR 72 | Resp 20 | Ht 68.0 in | Wt 192.0 lb

## 2015-12-07 DIAGNOSIS — I429 Cardiomyopathy, unspecified: Secondary | ICD-10-CM | POA: Insufficient documentation

## 2015-12-07 DIAGNOSIS — K219 Gastro-esophageal reflux disease without esophagitis: Secondary | ICD-10-CM | POA: Diagnosis not present

## 2015-12-07 DIAGNOSIS — I110 Hypertensive heart disease with heart failure: Secondary | ICD-10-CM | POA: Insufficient documentation

## 2015-12-07 DIAGNOSIS — Z87891 Personal history of nicotine dependence: Secondary | ICD-10-CM | POA: Insufficient documentation

## 2015-12-07 DIAGNOSIS — Z9841 Cataract extraction status, right eye: Secondary | ICD-10-CM | POA: Diagnosis not present

## 2015-12-07 DIAGNOSIS — I5032 Chronic diastolic (congestive) heart failure: Secondary | ICD-10-CM | POA: Insufficient documentation

## 2015-12-07 DIAGNOSIS — Z86718 Personal history of other venous thrombosis and embolism: Secondary | ICD-10-CM | POA: Insufficient documentation

## 2015-12-07 DIAGNOSIS — M1990 Unspecified osteoarthritis, unspecified site: Secondary | ICD-10-CM | POA: Insufficient documentation

## 2015-12-07 DIAGNOSIS — J8410 Pulmonary fibrosis, unspecified: Secondary | ICD-10-CM | POA: Insufficient documentation

## 2015-12-07 DIAGNOSIS — Z888 Allergy status to other drugs, medicaments and biological substances status: Secondary | ICD-10-CM | POA: Insufficient documentation

## 2015-12-07 DIAGNOSIS — Z7982 Long term (current) use of aspirin: Secondary | ICD-10-CM | POA: Diagnosis not present

## 2015-12-07 DIAGNOSIS — Z9981 Dependence on supplemental oxygen: Secondary | ICD-10-CM | POA: Diagnosis not present

## 2015-12-07 DIAGNOSIS — R5383 Other fatigue: Secondary | ICD-10-CM | POA: Diagnosis not present

## 2015-12-07 DIAGNOSIS — R600 Localized edema: Secondary | ICD-10-CM | POA: Insufficient documentation

## 2015-12-07 DIAGNOSIS — Z9842 Cataract extraction status, left eye: Secondary | ICD-10-CM | POA: Diagnosis not present

## 2015-12-07 DIAGNOSIS — H918X1 Other specified hearing loss, right ear: Secondary | ICD-10-CM | POA: Diagnosis not present

## 2015-12-07 DIAGNOSIS — Z86010 Personal history of colonic polyps: Secondary | ICD-10-CM | POA: Insufficient documentation

## 2015-12-07 DIAGNOSIS — J449 Chronic obstructive pulmonary disease, unspecified: Secondary | ICD-10-CM | POA: Diagnosis not present

## 2015-12-07 DIAGNOSIS — E119 Type 2 diabetes mellitus without complications: Secondary | ICD-10-CM | POA: Insufficient documentation

## 2015-12-07 DIAGNOSIS — J309 Allergic rhinitis, unspecified: Secondary | ICD-10-CM | POA: Diagnosis not present

## 2015-12-07 DIAGNOSIS — E1121 Type 2 diabetes mellitus with diabetic nephropathy: Secondary | ICD-10-CM

## 2015-12-07 DIAGNOSIS — Z794 Long term (current) use of insulin: Secondary | ICD-10-CM | POA: Insufficient documentation

## 2015-12-07 DIAGNOSIS — I950 Idiopathic hypotension: Secondary | ICD-10-CM

## 2015-12-07 DIAGNOSIS — R0602 Shortness of breath: Secondary | ICD-10-CM | POA: Diagnosis not present

## 2015-12-07 DIAGNOSIS — M7989 Other specified soft tissue disorders: Secondary | ICD-10-CM | POA: Diagnosis not present

## 2015-12-07 DIAGNOSIS — E785 Hyperlipidemia, unspecified: Secondary | ICD-10-CM | POA: Diagnosis not present

## 2015-12-07 DIAGNOSIS — I959 Hypotension, unspecified: Secondary | ICD-10-CM | POA: Insufficient documentation

## 2015-12-07 NOTE — Progress Notes (Signed)
Subjective:    Patient ID: Miguel Huber, male    DOB: 02/16/1928, 82 y.o.   MRN: [id number]  Congestive Heart Failure Presents for initial visit. The disease course has been stable. Associated symptoms include edema, fatigue and shortness of breath. Pertinent negatives include no abdominal pain, chest pain, chest pressure, orthopnea or palpitations. The symptoms have been improving. Past treatments include salt and fluid restriction, ACE inhibitors and oxygen. The treatment provided moderate relief. Compliance with prior treatments has been good. His past medical history is significant for chronic lung disease, DM, DVT and HTN. There is no history of CVA.  Other This is a recurrent (hypotension) problem. The current episode started more than 1 month ago. The problem occurs daily. The problem has been unchanged. Associated symptoms include fatigue. Pertinent negatives include no abdominal pain, chest pain, congestion, coughing, headaches, neck pain, sore throat or weakness. Nothing aggravates the symptoms. He has tried position changes for the symptoms. The treatment provided mild relief.   Past Medical History  Diagnosis Date  . Allergic rhinitis   . Hx of adenomatous colonic polyps   . Pulmonary fibrosis ([place])   . COPD (chronic obstructive pulmonary disease) ([place])   . GERD (gastroesophageal reflux disease)   . Erectile dysfunction   . Cardiomyopathy   . Hyperlipidemia   . Renal insufficiency   . Osteoarthritis   . Diabetes mellitus   . Hypertension   . DVT (deep venous thrombosis), right 5/13    Past Surgical History  Procedure Laterality Date  . Orif clavicle fracture      Left clavicle fracture- child  . Orif ulnar fracture      Left readius/ ulna fracture child  . Lung biopsy      Right lung bx. negative 2001  . Cataract extraction      Cataracts/ IOL OU, [place] 2005, [place] ?  2000  . Cardiovascular stress test      Myoview stress negative EF 65-70% 08/04    Family  History  Problem Relation Age of Onset  . Cancer Neg Hx     Social History  Substance Use Topics  . Smoking status: Former Research scientist (life sciences)  . Smokeless tobacco: Never Used  . Alcohol Use: No    Allergies  Allergen Reactions  . Theophyllines Hives    Prior to Admission medications   Medication Sig Start Date End Date Taking? Authorizing Provider  albuterol (ACCUNEB) 0.63 MG/3ML nebulizer solution Take 1 ampule by nebulization 2 (two) times daily as needed for wheezing.   Yes Historical Provider, MD  aspirin EC 81 MG tablet Take 81 mg by mouth daily.   Yes Historical Provider, MD  furosemide (LASIX) 80 MG tablet Take 80 mg by mouth daily.   Yes Historical Provider, MD  insulin NPH Human (HUMULIN N,NOVOLIN N) 100 UNIT/ML injection Inject 0.25 mLs (25 Units total) into the skin 2 (two) times daily. 11/26/15  Yes Venia Carbon, MD  insulin regular (NOVOLIN R,HUMULIN R) 100 units/mL injection Inject 0.1 mLs (10 Units total) into the skin 2 (two) times daily before a meal. 11/26/15  Yes Venia Carbon, MD  ipratropium-albuterol (DUONEB) 0.5-2.5 (3) MG/3ML SOLN Take 3 mLs by nebulization every 6 (six) hours as needed (for wheezing/shortness of breath).    Yes Historical Provider, MD  lisinopril (PRINIVIL,ZESTRIL) 10 MG tablet Take 10 mg by mouth daily.   Yes Historical Provider, MD  omeprazole (PRILOSEC) 20 MG capsule Take 1 capsule (20 mg total) by mouth daily. 11/29/15  Yes Venia Carbon, MD  polyethylene glycol (MIRALAX / GLYCOLAX) packet Take 17 g by mouth daily.   Yes Historical Provider, MD  rosuvastatin (CRESTOR) 20 MG tablet Take 20 mg by mouth at bedtime.   Yes Historical Provider, MD  temazepam (RESTORIL) 30 MG capsule Take 1 capsule (30 mg total) by mouth at bedtime as needed for sleep. 11/26/15  Yes Venia Carbon, MD      Review of Systems  Constitutional: Positive for fatigue. Negative for appetite change.  HENT: Positive for hearing loss (right ear) and sinus pressure. Negative  for congestion and sore throat.   Eyes: Negative.   Respiratory: Positive for shortness of breath. Negative for cough, chest tightness and wheezing.   Cardiovascular: Positive for leg swelling. Negative for chest pain and palpitations.  Gastrointestinal: Negative for abdominal pain and abdominal distention.  Endocrine: Negative.   Genitourinary: Negative.   Musculoskeletal: Negative for back pain and neck pain.  Skin: Negative.   Allergic/Immunologic: Negative.   Neurological: Negative for dizziness, weakness, light-headedness and headaches.  Hematological: Negative for adenopathy. Does not bruise/bleed easily.  Psychiatric/Behavioral: Negative for sleep disturbance (sleeping on 1 pillow with oxyen at 2L) and dysphoric mood. The patient is not nervous/anxious.        Objective:   Physical Exam  Constitutional: He is oriented to person, place, and time. He appears well-developed and well-nourished.  HENT:  Head: Normocephalic and atraumatic.  Eyes: Conjunctivae are normal. Pupils are equal, round, and reactive to light.  Neck: Normal range of motion. Neck supple.  Cardiovascular: Normal rate and regular rhythm.   Pulmonary/Chest: Effort normal. He has no wheezes. He has no rales.  Abdominal: Soft. He exhibits no distension. There is no tenderness.  Musculoskeletal: He exhibits edema (1+ pitting edema in bilateral lower legs). He exhibits no tenderness.  Neurological: He is alert and oriented to person, place, and time.  Skin: Skin is warm and dry.  Psychiatric: He has a normal mood and affect. His behavior is normal. Thought content normal.  Nursing note and vitals reviewed.  BP 99/52 mmHg  Pulse 72  Resp 20  Ht 5\' 8"  (1.727 m)  Wt 192 lb (87.091 kg)  BMI 29.20 kg/m2  SpO2 93%        Assessment & Plan:  1: Chronic heart failure with preserved ejection fraction- Patient presents with fatigue and shortness of breath with moderate exertion which is relieved upon rest (Class  II). He was able to walk into the office today with his walker and says that he was a little short of breath but feels like some of that is due to the hot/humid weather. He is not weighing himself daily but does have scales at home. Discussed the importance of weighing daily and calling for an overnight weight gain of >2 pounds or a weekly weight gain of >5 pounds. He is not adding any salt to his food and says that he's using Mrs. Dash instead. Does have chronic edema in his legs and admits that he doesn't elevate his legs much because he's too busy. Encouraged him to elevate them when possible. Currently taking 80mg  furosemide daily.  2: Hypotension- Blood pressure is on the low side but he says that it's been running low. Denies any dizziness. 3: COPD- Patient does wear oxygen at 2L at bedtime and occasionally during the day. Does have a nebulizer that he uses twice daily as needed. 4: Diabetes- He says that his fasting glucose this morning was 176. Takes  his insulin and follows with his PCP regarding this.  Medication list was reviewed with the patient.  Return here in 1 month or sooner for any questions/problems before then.

## 2015-12-07 NOTE — Patient Instructions (Signed)
Begin weighing daily and call for an overnight weight gain of > 2 pounds or a weekly weight gain of >5 pounds. 

## 2015-12-13 ENCOUNTER — Other Ambulatory Visit: Payer: Self-pay | Admitting: Internal Medicine

## 2015-12-21 ENCOUNTER — Other Ambulatory Visit: Payer: Self-pay | Admitting: Internal Medicine

## 2015-12-24 NOTE — Telephone Encounter (Signed)
Left refill on voice mail at pharmacy  

## 2015-12-24 NOTE — Telephone Encounter (Signed)
Lst filled 11-26-15 #30 Last OV 11-26-15 Next OV 02-21-16

## 2015-12-24 NOTE — Telephone Encounter (Signed)
Approved: 30 x 0 

## 2016-01-07 ENCOUNTER — Ambulatory Visit: Payer: Medicare Other | Admitting: Family

## 2016-01-10 ENCOUNTER — Ambulatory Visit: Payer: Medicare Other | Attending: Family | Admitting: Family

## 2016-01-10 ENCOUNTER — Encounter: Payer: Self-pay | Admitting: Family

## 2016-01-10 VITALS — BP 102/70 | HR 72 | Resp 20 | Ht 68.0 in | Wt 192.0 lb

## 2016-01-10 DIAGNOSIS — K219 Gastro-esophageal reflux disease without esophagitis: Secondary | ICD-10-CM | POA: Diagnosis not present

## 2016-01-10 DIAGNOSIS — Z87891 Personal history of nicotine dependence: Secondary | ICD-10-CM | POA: Insufficient documentation

## 2016-01-10 DIAGNOSIS — M1990 Unspecified osteoarthritis, unspecified site: Secondary | ICD-10-CM | POA: Insufficient documentation

## 2016-01-10 DIAGNOSIS — I5032 Chronic diastolic (congestive) heart failure: Secondary | ICD-10-CM

## 2016-01-10 DIAGNOSIS — I82401 Acute embolism and thrombosis of unspecified deep veins of right lower extremity: Secondary | ICD-10-CM | POA: Diagnosis not present

## 2016-01-10 DIAGNOSIS — Z794 Long term (current) use of insulin: Secondary | ICD-10-CM | POA: Insufficient documentation

## 2016-01-10 DIAGNOSIS — E119 Type 2 diabetes mellitus without complications: Secondary | ICD-10-CM | POA: Insufficient documentation

## 2016-01-10 DIAGNOSIS — I110 Hypertensive heart disease with heart failure: Secondary | ICD-10-CM | POA: Insufficient documentation

## 2016-01-10 DIAGNOSIS — Z9889 Other specified postprocedural states: Secondary | ICD-10-CM | POA: Insufficient documentation

## 2016-01-10 DIAGNOSIS — I10 Essential (primary) hypertension: Secondary | ICD-10-CM

## 2016-01-10 DIAGNOSIS — Z7982 Long term (current) use of aspirin: Secondary | ICD-10-CM | POA: Diagnosis not present

## 2016-01-10 DIAGNOSIS — Z79899 Other long term (current) drug therapy: Secondary | ICD-10-CM | POA: Insufficient documentation

## 2016-01-10 DIAGNOSIS — I509 Heart failure, unspecified: Secondary | ICD-10-CM | POA: Insufficient documentation

## 2016-01-10 DIAGNOSIS — E1121 Type 2 diabetes mellitus with diabetic nephropathy: Secondary | ICD-10-CM

## 2016-01-10 DIAGNOSIS — Z809 Family history of malignant neoplasm, unspecified: Secondary | ICD-10-CM | POA: Insufficient documentation

## 2016-01-10 DIAGNOSIS — J449 Chronic obstructive pulmonary disease, unspecified: Secondary | ICD-10-CM | POA: Diagnosis not present

## 2016-01-10 NOTE — Progress Notes (Signed)
Subjective:    Patient ID: Miguel Huber, male    DOB: 18-Jul-1927, 82 y.o.   MRN: [id number]  Congestive Heart Failure  Presents for follow-up visit. Associated symptoms include fatigue and shortness of breath. Pertinent negatives include no abdominal pain, chest pain, edema, orthopnea or palpitations. The symptoms have been stable.  Hypertension  This is a chronic problem. The current episode started more than 1 year ago. The problem is controlled. Associated symptoms include shortness of breath. Pertinent negatives include no chest pain, neck pain, palpitations or peripheral edema. There are no associated agents to hypertension. Risk factors for coronary artery disease include male gender, diabetes mellitus and dyslipidemia. Past treatments include ACE inhibitors, diuretics and lifestyle changes. The current treatment provides significant improvement. There are no compliance problems.  Hypertensive end-organ damage includes kidney disease and heart failure.    Past Medical History:  Diagnosis Date  . Allergic rhinitis   . Cardiomyopathy   . COPD (chronic obstructive pulmonary disease) ([place])   . Diabetes mellitus   . DVT (deep venous thrombosis), right 5/13  . Erectile dysfunction   . GERD (gastroesophageal reflux disease)   . Hx of adenomatous colonic polyps   . Hyperlipidemia   . Hypertension   . Osteoarthritis   . Pulmonary fibrosis ([place])   . Renal insufficiency     Past Surgical History:  Procedure Laterality Date  . CARDIOVASCULAR STRESS TEST     Myoview stress negative EF 65-70% 08/04  . CATARACT EXTRACTION     Cataracts/ IOL OU, [place] 2005, [place] ?  2000  . LUNG BIOPSY     Right lung bx. negative 2001  . ORIF CLAVICLE FRACTURE     Left clavicle fracture- child  . ORIF ULNAR FRACTURE     Left readius/ ulna fracture child    Family History  Problem Relation Age of Onset  . Cancer Neg Hx     Social History  Substance Use Topics  . Smoking status: Former  Research scientist (life sciences)  . Smokeless tobacco: Never Used  . Alcohol use No    Allergies  Allergen Reactions  . Theophyllines Hives    Prior to Admission medications   Medication Sig Start Date End Date Taking? Authorizing Provider  albuterol (ACCUNEB) 0.63 MG/3ML nebulizer solution Take 1 ampule by nebulization 2 (two) times daily as needed for wheezing.   Yes Historical Provider, MD  aspirin EC 81 MG tablet Take 81 mg by mouth daily.   Yes Historical Provider, MD  furosemide (LASIX) 80 MG tablet Take 80 mg by mouth daily.   Yes Historical Provider, MD  insulin NPH Human (HUMULIN N,NOVOLIN N) 100 UNIT/ML injection Inject 0.25 mLs (25 Units total) into the skin 2 (two) times daily. 11/26/15  Yes Venia Carbon, MD  insulin regular (NOVOLIN R,HUMULIN R) 100 units/mL injection Inject 0.1 mLs (10 Units total) into the skin 2 (two) times daily before a meal. 11/26/15  Yes Venia Carbon, MD  ipratropium-albuterol (DUONEB) 0.5-2.5 (3) MG/3ML SOLN Take 3 mLs by nebulization every 6 (six) hours as needed (for wheezing/shortness of breath).    Yes Historical Provider, MD  lisinopril (PRINIVIL,ZESTRIL) 10 MG tablet Take 10 mg by mouth daily.   Yes Historical Provider, MD  omeprazole (PRILOSEC) 20 MG capsule Take 1 capsule (20 mg total) by mouth daily. 11/29/15  Yes Venia Carbon, MD  polyethylene glycol [facility] / GLYCOLAX) packet Take 17 g by mouth daily.   Yes Historical Provider, MD  rosuvastatin (CRESTOR) 20 MG  tablet Take 20 mg by mouth at bedtime.   Yes Historical Provider, MD  temazepam (RESTORIL) 30 MG capsule TAKE ONE CAPSULE BY MOUTH AT BEDTIME AS NEEDED FOR SLEEP 12/24/15  Yes Venia Carbon, MD     Review of Systems  Constitutional: Positive for fatigue. Negative for appetite change.  HENT: Negative for congestion, postnasal drip and sore throat.   Eyes: Negative.   Respiratory: Positive for shortness of breath. Negative for cough and chest tightness.   Cardiovascular: Negative for chest pain,  palpitations and leg swelling.  Gastrointestinal: Negative for abdominal distention and abdominal pain.  Endocrine: Negative.   Genitourinary: Negative.   Musculoskeletal: Negative for back pain and neck pain.  Skin: Negative.   Allergic/Immunologic: Negative.   Neurological: Positive for light-headedness (sometimes). Negative for dizziness.  Hematological: Negative for adenopathy. Bruises/bleeds easily.  Psychiatric/Behavioral: Positive for sleep disturbance (waking often. sleeping on 1 pillow with oxygen). Negative for dysphoric mood. The patient is not nervous/anxious.        Objective:   Physical Exam  Constitutional: He is oriented to person, place, and time. He appears well-developed and well-nourished.  HENT:  Head: Normocephalic and atraumatic.  Eyes: Conjunctivae are normal. Pupils are equal, round, and reactive to light.  Neck: Normal range of motion. Neck supple.  Cardiovascular: Normal rate and regular rhythm.   Pulmonary/Chest: Effort normal. He has no wheezes. He has no rales.  Abdominal: Soft. He exhibits no distension. There is no tenderness.  Musculoskeletal: He exhibits no edema or tenderness.  Neurological: He is alert and oriented to person, place, and time.  Skin: Skin is warm and dry.  Psychiatric: He has a normal mood and affect. His behavior is normal. Thought content normal.  Nursing note and vitals reviewed.   BP 102/70   Pulse 72   Resp 20   Ht 5\' 8"  (1.727 m)   Wt 192 lb (87.1 kg)   SpO2 93%   BMI 29.19 kg/m        Assessment & Plan:  1: Chronic heart failure with preserved ejection fraction- Patient presents with fatigue and shortness of breath with little exertion (Class III). Symptoms do improve upon rest. Denies any swelling in his legs or abdomen. Continues to weigh himself daily and says that his weight has been stable. By our scale, his weight is essentially unchanged. Reminded to call for an overnight weight gain of >2 pounds or a weekly  weight gain of >5 pounds. He is not adding any salt to his food and is trying to eat low sodium foods. Tolerating his medications without known side effects.  2: HTN- Blood pressure looks good today. Continue medications at this time. Sees his PCP in September. 3: COPD- Does have his nebulizer at home that he uses if he needs to. Follows with pulmonology. 4: Diabetes- He says that his glucose level this morning was 165. Continues to use insulin and follow with his PCP regarding this.   Medication list was reviewed.  Return here in 3 months or sooner for any questions/problems before then.

## 2016-01-10 NOTE — Patient Instructions (Signed)
Continue weighing daily and call for an overnight weight gain of > 2 pounds or a weekly weight gain of >5 pounds. 

## 2016-01-15 ENCOUNTER — Telehealth: Payer: Self-pay

## 2016-01-15 DIAGNOSIS — J9611 Chronic respiratory failure with hypoxia: Secondary | ICD-10-CM | POA: Diagnosis not present

## 2016-01-15 DIAGNOSIS — J449 Chronic obstructive pulmonary disease, unspecified: Secondary | ICD-10-CM | POA: Diagnosis not present

## 2016-01-15 NOTE — Telephone Encounter (Signed)
Pt left v/m that he was out of Novolin and request refill to [facility]; spoke with Thurmond Butts at [facility] and Novolin has refills but had to be ordered and will be in on 01/16/16; if pt needs today can pick up at [facility]. Gentleman answered phone and said pt out until after 5 PM cannot reach on cell and I called back and left v/m per DPR with above info.

## 2016-01-21 DIAGNOSIS — C44212 Basal cell carcinoma of skin of right ear and external auricular canal: Secondary | ICD-10-CM | POA: Diagnosis not present

## 2016-01-27 ENCOUNTER — Other Ambulatory Visit: Payer: Self-pay | Admitting: Internal Medicine

## 2016-01-29 NOTE — Telephone Encounter (Signed)
Approved: 30 x 0 

## 2016-01-29 NOTE — Telephone Encounter (Signed)
Last filled 12-26-15 #30 Last OV 11-26-15 Next OV 02-21-16

## 2016-01-29 NOTE — Telephone Encounter (Signed)
Left refill on voice mail at pharmacy  

## 2016-02-06 DIAGNOSIS — R0602 Shortness of breath: Secondary | ICD-10-CM | POA: Diagnosis not present

## 2016-02-18 DIAGNOSIS — H40003 Preglaucoma, unspecified, bilateral: Secondary | ICD-10-CM | POA: Diagnosis not present

## 2016-02-20 ENCOUNTER — Other Ambulatory Visit: Payer: Self-pay | Admitting: Internal Medicine

## 2016-02-21 ENCOUNTER — Ambulatory Visit (INDEPENDENT_AMBULATORY_CARE_PROVIDER_SITE_OTHER): Payer: Medicare Other | Admitting: Internal Medicine

## 2016-02-21 ENCOUNTER — Encounter: Payer: Self-pay | Admitting: Internal Medicine

## 2016-02-21 VITALS — BP 130/70 | HR 86 | Temp 98.2°F | Wt 192.0 lb

## 2016-02-21 DIAGNOSIS — E1121 Type 2 diabetes mellitus with diabetic nephropathy: Secondary | ICD-10-CM

## 2016-02-21 DIAGNOSIS — I5032 Chronic diastolic (congestive) heart failure: Secondary | ICD-10-CM

## 2016-02-21 DIAGNOSIS — Z23 Encounter for immunization: Secondary | ICD-10-CM

## 2016-02-21 DIAGNOSIS — J9611 Chronic respiratory failure with hypoxia: Secondary | ICD-10-CM

## 2016-02-21 DIAGNOSIS — Z794 Long term (current) use of insulin: Secondary | ICD-10-CM | POA: Diagnosis not present

## 2016-02-21 DIAGNOSIS — J439 Emphysema, unspecified: Secondary | ICD-10-CM | POA: Diagnosis not present

## 2016-02-21 DIAGNOSIS — N184 Chronic kidney disease, stage 4 (severe): Secondary | ICD-10-CM | POA: Diagnosis not present

## 2016-02-21 LAB — RENAL FUNCTION PANEL
ALBUMIN: 3.9 g/dL (ref 3.5–5.2)
BUN: 53 mg/dL — ABNORMAL HIGH (ref 6–23)
CALCIUM: 9.1 mg/dL (ref 8.4–10.5)
CO2: 30 mEq/L (ref 19–32)
CREATININE: 1.87 mg/dL — AB (ref 0.40–1.50)
Chloride: 108 mEq/L (ref 96–112)
GFR: 36.58 mL/min — ABNORMAL LOW (ref >60.00)
GLUCOSE: 174 mg/dL — AB (ref 70–99)
Phosphorus: 2.5 mg/dL (ref 2.3–4.6)
Potassium: 4.3 mEq/L (ref 3.5–5.1)
Sodium: 145 mEq/L (ref 135–145)

## 2016-02-21 LAB — HEMOGLOBIN A1C: HEMOGLOBIN A1C: 7.1 % — AB (ref 4.6–6.5)

## 2016-02-21 MED ORDER — TEMAZEPAM 30 MG PO CAPS
30.0000 mg | ORAL_CAPSULE | Freq: Every evening | ORAL | 0 refills | Status: DC | PRN
Start: 2016-02-21 — End: 2016-03-22

## 2016-02-21 NOTE — Assessment & Plan Note (Signed)
Stable weight and neutral fluid status Keeps up with CHF clinic

## 2016-02-21 NOTE — Addendum Note (Signed)
Addended by: Pilar Grammes on: 02/21/2016 03:22 PM   Modules accepted: Orders

## 2016-02-21 NOTE — Assessment & Plan Note (Signed)
Slightly better last time Will recheck along with PTH

## 2016-02-21 NOTE — Progress Notes (Signed)
Subjective:    Patient ID: CLESSON LAWVER, male    DOB: 23-May-1928, 82 y.o.   MRN: [id number]  HPI Here for follow up of diabetes, CHF and other chronic conditions He has been doing okay Very careful about not eating salt Weighs daily--- 183-186#  Breathing still not great Has to rest when doing housework Wears oxygen all the time--except for shower  Checks sugars twice a day Mostly in 100's  Increases regular by 5 units if over 200 (not often) Holds regular if under 100 (like if he misses a meal) No apparent hypoglycemia  No chest pain No palpitations No edema No dizziness since hospital stay  Current Outpatient Prescriptions on File Prior to Visit  Medication Sig Dispense Refill  . aspirin EC 81 MG tablet Take 81 mg by mouth daily.    . furosemide (LASIX) 80 MG tablet Take 80 mg by mouth daily.    . insulin NPH Human (HUMULIN N,NOVOLIN N) 100 UNIT/ML injection Inject 0.25 mLs (25 Units total) into the skin 2 (two) times daily. 10 mL 5  . insulin regular (NOVOLIN R,HUMULIN R) 100 units/mL injection Inject 0.1 mLs (10 Units total) into the skin 2 (two) times daily before a meal. 10 mL 5  . ipratropium-albuterol (DUONEB) 0.5-2.5 (3) MG/3ML SOLN Take 3 mLs by nebulization every 6 (six) hours as needed (for wheezing/shortness of breath).     Marland Kitchen lisinopril (PRINIVIL,ZESTRIL) 10 MG tablet Take 10 mg by mouth daily.    Marland Kitchen omeprazole (PRILOSEC) 20 MG capsule Take 1 capsule (20 mg total) by mouth daily. 90 capsule 3  . polyethylene glycol (MIRALAX / GLYCOLAX) packet Take 17 g by mouth daily.    . rosuvastatin (CRESTOR) 20 MG tablet Take 20 mg by mouth at bedtime.    . temazepam (RESTORIL) 30 MG capsule TAKE ONE CAPSULE BY MOUTH AT BEDTIME AS NEEDED 30 capsule 0   No current facility-administered medications on file prior to visit.     Allergies  Allergen Reactions  . Theophyllines Hives    Past Medical History:  Diagnosis Date  . Allergic rhinitis   . Cardiomyopathy   .  COPD (chronic obstructive pulmonary disease) ([place])   . Diabetes mellitus   . DVT (deep venous thrombosis), right 5/13  . Erectile dysfunction   . GERD (gastroesophageal reflux disease)   . Hx of adenomatous colonic polyps   . Hyperlipidemia   . Hypertension   . Osteoarthritis   . Pulmonary fibrosis ([place])   . Renal insufficiency     Past Surgical History:  Procedure Laterality Date  . CARDIOVASCULAR STRESS TEST     Myoview stress negative EF 65-70% 08/04  . CATARACT EXTRACTION     Cataracts/ IOL OU, [place] 2005, [place] ?  2000  . LUNG BIOPSY     Right lung bx. negative 2001  . ORIF CLAVICLE FRACTURE     Left clavicle fracture- child  . ORIF ULNAR FRACTURE     Left readius/ ulna fracture child    Family History  Problem Relation Age of Onset  . Cancer Neg Hx     Social History   Social History  . Marital status: Widowed    Spouse name: N/A  . Number of children: 3  . Years of education: N/A   Occupational History  . retired     IT consultant   Social History Main Topics  . Smoking status: Former Research scientist (life sciences)  . Smokeless tobacco: Never Used  . Alcohol use No  .  Drug use: No  . Sexual activity: Not on file   Other Topics Concern  . Not on file   Social History Narrative   Has platonic lady lfriend since ~2008  [facility]. Currently in nursing home after stroke.      Has living will now   No health care POA--has 3 children   Would like to try resuscitation--would accept ventilator only temporarily   Might accept tube feeds if cognitively unaware   Review of Systems Sleep okay---trouble initiating. Awakens at 6AM to void Appetite is good Still sees [facility] Kidney most weekdays    Objective:   Physical Exam  Constitutional: He appears well-developed and well-nourished. No distress.  Neck: No thyromegaly present.  Cardiovascular: Normal rate, regular rhythm and normal heart sounds.  Exam reveals no gallop.   No murmur heard. 1+ pulse in left foot--faint on right    Pulmonary/Chest: Effort normal. No respiratory distress. He has no wheezes. He has no rales.  Decreased breath sounds but clear  Musculoskeletal: He exhibits no edema.  Lymphadenopathy:    He has no cervical adenopathy.  Psychiatric: He has a normal mood and affect. His behavior is normal.          Assessment & Plan:

## 2016-02-21 NOTE — Assessment & Plan Note (Signed)
Does okay on continuous oxygen

## 2016-02-21 NOTE — Assessment & Plan Note (Signed)
Stable DOE No changes needed 

## 2016-02-21 NOTE — Assessment & Plan Note (Signed)
Seems to be okay No action unless over 9%

## 2016-02-21 NOTE — Progress Notes (Signed)
Pre visit review using our clinic review tool, if applicable. No additional management support is needed unless otherwise documented below in the visit note. 

## 2016-02-22 LAB — PARATHYROID HORMONE, INTACT (NO CA): PTH: 114 pg/mL — ABNORMAL HIGH (ref 14–64)

## 2016-02-27 ENCOUNTER — Telehealth: Payer: Self-pay

## 2016-02-27 DIAGNOSIS — H40003 Preglaucoma, unspecified, bilateral: Secondary | ICD-10-CM | POA: Diagnosis not present

## 2016-02-27 NOTE — Telephone Encounter (Signed)
Tried to speak to someone at the pharmacy and they sent me to the voice mail. I left a detailed message advising them to allow him to get his Novolin N now.

## 2016-02-27 NOTE — Telephone Encounter (Signed)
Pt left v/m; pt has available refills on Novolin N  But [facility] said [facility] cannot fill until 03/05/16. Pt request [facility] and give [facility] to [facility]. Pt does not have enough med to last until 03/05/16. Unable to reach pt for more details.

## 2016-02-27 NOTE — Telephone Encounter (Signed)
Please call them and approve his refill

## 2016-03-20 ENCOUNTER — Ambulatory Visit (INDEPENDENT_AMBULATORY_CARE_PROVIDER_SITE_OTHER): Payer: Medicare Other | Admitting: Family Medicine

## 2016-03-20 ENCOUNTER — Encounter: Payer: Self-pay | Admitting: Family Medicine

## 2016-03-20 ENCOUNTER — Ambulatory Visit: Payer: Medicare Other | Admitting: Internal Medicine

## 2016-03-20 VITALS — BP 130/64 | HR 86 | Temp 97.8°F | Wt 197.5 lb

## 2016-03-20 DIAGNOSIS — J22 Unspecified acute lower respiratory infection: Secondary | ICD-10-CM

## 2016-03-20 MED ORDER — BENZONATATE 200 MG PO CAPS: 200.0000 mg | ORAL_CAPSULE | Freq: Three times a day (TID) | ORAL | 0 refills | Status: DC | PRN

## 2016-03-20 MED ORDER — AZITHROMYCIN 250 MG PO TABS: ORAL_TABLET | ORAL | 0 refills | Status: DC

## 2016-03-20 NOTE — Progress Notes (Signed)
Pre visit review using our clinic review tool, if applicable. No additional management support is needed unless otherwise documented below in the visit note. 

## 2016-03-20 NOTE — Progress Notes (Signed)
Subjective:    Patient ID: Miguel Huber, male    DOB: 1928-02-08, 82 y.o.   MRN: [id number]  HPI This is a 82 yo male who presents today with 7 days of cough which is worse at night. Cough is dry. Breathing is a little worse. Having some dried blood from his nose in the mornings. Some drainage at night. Currently uses O2 at 2 liters via nasal cannula. Throat sore. Some intermittent pressure in ears. Some wheezing at night. Using nebulizer treatment (deoneb) in the morning and at bedtime. Used this morning. Helps with breathing. Has maybe gotten a "tiny bit" better over last several days. No fever or chills. Blood sugars 150-160 on average, no significant change. Takes a long time to get to sleep. Usually awakens around 5 am to urinate. Some cough through the night. Good fluid intake. BMs ok. Good appetite. Had similar episode 3/17, good results with azithromycin. Has been on prednisone in the past and prefers not to use if at all possible. He stays active, [facility] the [facility], playing checkers and other games.   Past Medical History:  Diagnosis Date  . Allergic rhinitis   . Cardiomyopathy   . COPD (chronic obstructive pulmonary disease) ([place])   . Diabetes mellitus   . DVT (deep venous thrombosis), right 5/13  . Erectile dysfunction   . GERD (gastroesophageal reflux disease)   . Hx of adenomatous colonic polyps   . Hyperlipidemia   . Hypertension   . Osteoarthritis   . Pulmonary fibrosis ([place])   . Renal insufficiency    Past Surgical History:  Procedure Laterality Date  . CARDIOVASCULAR STRESS TEST     Myoview stress negative EF 65-70% 08/04  . CATARACT EXTRACTION     Cataracts/ IOL OU, [place] 2005, [place] ?  2000  . LUNG BIOPSY     Right lung bx. negative 2001  . ORIF CLAVICLE FRACTURE     Left clavicle fracture- child  . ORIF ULNAR FRACTURE     Left readius/ ulna fracture child   Family History  Problem Relation Age of Onset  . Cancer Neg Hx    Social  History   Social History  . Marital status: Widowed    Spouse name: N/A  . Number of children: 3  . Years of education: N/A   Occupational History  . retired     IT consultant   Social History Main Topics  . Smoking status: Former Research scientist (life sciences)  . Smokeless tobacco: Never Used  . Alcohol use No  . Drug use: No  . Sexual activity: Not on file   Other Topics Concern  . Not on file   Social History Narrative   Has platonic lady lfriend since ~2008  [facility]. Currently in nursing home after stroke.      Has living will now   No health care POA--has 3 children   Would like to try resuscitation--would accept ventilator only temporarily   Might accept tube feeds if cognitively unaware      Review of Systems Per HPI    Objective:   Physical Exam  Constitutional: He appears well-developed and well-nourished.  Appears stated age.   HENT:  Head: Normocephalic and atraumatic.  Right Ear: Tympanic membrane, external ear and ear canal normal.  Left Ear: Tympanic membrane, external ear and ear canal normal.  Nose: Mucosal edema present.  Mouth/Throat: Uvula is midline. No oropharyngeal exudate or posterior oropharyngeal edema.  Edentulous. Post nasal drainage.  Cardiovascular: Normal rate, regular rhythm and normal heart sounds.   Pulmonary/Chest: Effort normal and breath sounds normal. No respiratory distress. He has no wheezes. He has no rales.  Breathing intermittently shallow. Dyspnea with long conversation.   Musculoskeletal: He exhibits no edema.  Neurological: He is alert.  Skin: Skin is warm and dry.  Psychiatric: He has a normal mood and affect. His behavior is normal. Judgment and thought content normal.  Vitals reviewed.     BP 130/64 (BP Location: Right Arm, Patient Position: Sitting, Cuff Size: Normal)   Pulse 86   Temp 97.8 F (36.6 C) (Oral)   Wt 197 lb 8 oz (89.6 kg)   SpO2 91%   BMI 30.03 kg/m  Recheck pulse ox- 92 Wt Readings from Last 3 Encounters:   03/20/16 197 lb 8 oz (89.6 kg)  02/21/16 192 lb (87.1 kg)  01/10/16 192 lb (87.1 kg)       Assessment & Plan:  1. Lower respiratory tract infection - encouraged good fluid intake, acetaminophen for pain/fever, use duo neb every 8 hours as needed - RTC precautions, follow up reviewed. He is to go to ER if worsening breathing. Contact office if no improvement in 24 hours. May need prednisone added. - azithromycin (ZITHROMAX) 250 MG tablet; Take two tablets today then one a day for next 4 days  Dispense: 6 tablet; Refill: 0 - benzonatate (TESSALON) 200 MG capsule; Take 1 capsule (200 mg total) by mouth 3 (three) times daily as needed for cough.  Dispense: 20 capsule; Refill: 0   Clarene Reamer, FNP-BC  [facility]  03/20/2016 12:17 PM

## 2016-03-20 NOTE — Patient Instructions (Signed)
You can take acetaminophen/tylenol for pain Drink enough fluids to make your urine light yellow If you are not feeling better in 24 hours, please call us

## 2016-03-22 ENCOUNTER — Other Ambulatory Visit: Payer: Self-pay | Admitting: Internal Medicine

## 2016-03-24 DIAGNOSIS — L851 Acquired keratosis [keratoderma] palmaris et plantaris: Secondary | ICD-10-CM | POA: Diagnosis not present

## 2016-03-24 DIAGNOSIS — S92514A Nondisplaced fracture of proximal phalanx of right lesser toe(s), initial encounter for closed fracture: Secondary | ICD-10-CM | POA: Diagnosis not present

## 2016-03-24 DIAGNOSIS — E1142 Type 2 diabetes mellitus with diabetic polyneuropathy: Secondary | ICD-10-CM | POA: Diagnosis not present

## 2016-03-24 DIAGNOSIS — M79671 Pain in right foot: Secondary | ICD-10-CM | POA: Diagnosis not present

## 2016-03-24 DIAGNOSIS — B351 Tinea unguium: Secondary | ICD-10-CM | POA: Diagnosis not present

## 2016-03-24 NOTE — Telephone Encounter (Signed)
Last filled 02-26-16 #30 Last OV 02-21-16 Next OV 08-28-16. Pt always asks a few days early to make sure the pharmacy has it. He last asked for rx on 02-21-16 but did not fill until 02-26-16.   Forwarding to Dr Danise Mina in Dr Alla German absence.

## 2016-03-25 NOTE — Telephone Encounter (Signed)
plz phone in. 

## 2016-03-25 NOTE — Telephone Encounter (Signed)
Left refill on voice mail at pharmacy  

## 2016-04-03 ENCOUNTER — Other Ambulatory Visit: Payer: Self-pay | Admitting: Internal Medicine

## 2016-04-04 ENCOUNTER — Encounter: Payer: Self-pay | Admitting: Family

## 2016-04-04 ENCOUNTER — Ambulatory Visit: Payer: Medicare Other | Admitting: Family

## 2016-04-04 ENCOUNTER — Ambulatory Visit: Payer: Medicare Other | Attending: Family | Admitting: Family

## 2016-04-04 VITALS — BP 110/60 | HR 70 | Resp 18 | Ht 68.0 in | Wt 196.0 lb

## 2016-04-04 DIAGNOSIS — E785 Hyperlipidemia, unspecified: Secondary | ICD-10-CM | POA: Diagnosis not present

## 2016-04-04 DIAGNOSIS — Z79899 Other long term (current) drug therapy: Secondary | ICD-10-CM | POA: Diagnosis not present

## 2016-04-04 DIAGNOSIS — Z87891 Personal history of nicotine dependence: Secondary | ICD-10-CM | POA: Insufficient documentation

## 2016-04-04 DIAGNOSIS — E119 Type 2 diabetes mellitus without complications: Secondary | ICD-10-CM | POA: Insufficient documentation

## 2016-04-04 DIAGNOSIS — J8410 Pulmonary fibrosis, unspecified: Secondary | ICD-10-CM | POA: Insufficient documentation

## 2016-04-04 DIAGNOSIS — K219 Gastro-esophageal reflux disease without esophagitis: Secondary | ICD-10-CM | POA: Diagnosis not present

## 2016-04-04 DIAGNOSIS — Z86718 Personal history of other venous thrombosis and embolism: Secondary | ICD-10-CM | POA: Diagnosis not present

## 2016-04-04 DIAGNOSIS — J439 Emphysema, unspecified: Secondary | ICD-10-CM

## 2016-04-04 DIAGNOSIS — I5032 Chronic diastolic (congestive) heart failure: Secondary | ICD-10-CM | POA: Diagnosis not present

## 2016-04-04 DIAGNOSIS — I110 Hypertensive heart disease with heart failure: Secondary | ICD-10-CM | POA: Diagnosis not present

## 2016-04-04 DIAGNOSIS — E1121 Type 2 diabetes mellitus with diabetic nephropathy: Secondary | ICD-10-CM

## 2016-04-04 DIAGNOSIS — Z794 Long term (current) use of insulin: Secondary | ICD-10-CM

## 2016-04-04 DIAGNOSIS — J449 Chronic obstructive pulmonary disease, unspecified: Secondary | ICD-10-CM | POA: Insufficient documentation

## 2016-04-04 DIAGNOSIS — Z7982 Long term (current) use of aspirin: Secondary | ICD-10-CM | POA: Diagnosis not present

## 2016-04-04 DIAGNOSIS — I429 Cardiomyopathy, unspecified: Secondary | ICD-10-CM | POA: Diagnosis not present

## 2016-04-04 DIAGNOSIS — I10 Essential (primary) hypertension: Secondary | ICD-10-CM

## 2016-04-04 DIAGNOSIS — N289 Disorder of kidney and ureter, unspecified: Secondary | ICD-10-CM | POA: Diagnosis not present

## 2016-04-04 NOTE — Progress Notes (Signed)
Patient ID: Miguel Huber, male    DOB: [birth year], 82 y.o.   MRN: [id number]  HPI  Mr Dornbush is a 82 y/o male with a history of DM, hyperlipidemia, COPD, HTN, right DVT, renal insufficiency, remote tobacco use and chronic heart failure.  Last echo was done on 11/18/15 and showed an EF of 60-65% without any regurgitation. PA pressure reported as being within the normal range.   Admitted on 11/16/15 with acute on chronic respiratory failure. Was given nebulizer treatments and discharged with prednisone taper. Oxygen was weaned down to 3L at that time. Was given IV diuretics while admitted. Was discharged after 2 days. Had been in the ER on 08/06/15 with bronchitis.  He presents today for a follow-up visit with fatigue and shortness of breath with exertion. Denies any swelling in his lower legs. Continues to weigh himself daily and weight chart reviewed and shows his weight has ranged from 181-186 pounds. Is not adding any salt to his food and uses NoSalt for seasoning. Continues to have a chronic dry cough and has recently been treated with antibiotics by his PCP.   Past Medical History:  Diagnosis Date  . Allergic rhinitis   . Cardiomyopathy   . COPD (chronic obstructive pulmonary disease) ([place])   . Diabetes mellitus   . DVT (deep venous thrombosis), right 5/13  . Erectile dysfunction   . GERD (gastroesophageal reflux disease)   . Hx of adenomatous colonic polyps   . Hyperlipidemia   . Hypertension   . Osteoarthritis   . Pulmonary fibrosis ([place])   . Renal insufficiency     Past Surgical History:  Procedure Laterality Date  . CARDIOVASCULAR STRESS TEST     Myoview stress negative EF 65-70% 08/04  . CATARACT EXTRACTION     Cataracts/ IOL OU, Epes 2005, [place] ?  2000  . LUNG BIOPSY     Right lung bx. negative 2001  . ORIF CLAVICLE FRACTURE     Left clavicle fracture- child  . ORIF ULNAR FRACTURE     Left readius/ ulna fracture child    Family History  Problem Relation Age  of Onset  . Cancer Neg Hx     Social History  Substance Use Topics  . Smoking status: Former Research scientist (life sciences)  . Smokeless tobacco: Never Used  . Alcohol use No    Allergies  Allergen Reactions  . Theophyllines Hives    Prior to Admission medications   Medication Sig Start Date End Date Taking? Authorizing Provider  aspirin EC 81 MG tablet Take 81 mg by mouth daily.   Yes Historical Provider, MD  benzonatate (TESSALON) 200 MG capsule Take 1 capsule (200 mg total) by mouth 3 (three) times daily as needed for cough. 03/20/16  Yes Elby Beck, FNP  furosemide (LASIX) 80 MG tablet TAKE 1 TABLET BY MOUTH EVERY DAY 04/03/16  Yes Venia Carbon, MD  insulin NPH Human (HUMULIN N,NOVOLIN N) 100 UNIT/ML injection Inject 0.25 mLs (25 Units total) into the skin 2 (two) times daily. 11/26/15  Yes Venia Carbon, MD  insulin regular (NOVOLIN R,HUMULIN R) 100 units/mL injection Inject 0.1 mLs (10 Units total) into the skin 2 (two) times daily before a meal. 11/26/15  Yes Venia Carbon, MD  ipratropium-albuterol (DUONEB) 0.5-2.5 (3) MG/3ML SOLN Take 3 mLs by nebulization every 6 (six) hours as needed (for wheezing/shortness of breath).    Yes Historical Provider, MD  lisinopril (PRINIVIL,ZESTRIL) 10 MG tablet Take 10 mg by mouth daily.  Yes Historical Provider, MD  omeprazole (PRILOSEC) 20 MG capsule Take 1 capsule (20 mg total) by mouth daily. 11/29/15  Yes Venia Carbon, MD  polyethylene glycol [facility] / GLYCOLAX) packet Take 17 g by mouth daily.   Yes Historical Provider, MD  rosuvastatin (CRESTOR) 20 MG tablet Take 20 mg by mouth at bedtime.   Yes Historical Provider, MD  temazepam (RESTORIL) 30 MG capsule TAKE ONE CAPSULE BY MOUTH AT BEDTIME AS NEEDED 03/25/16  Yes Ria Bush, MD    Review of Systems  Constitutional: Positive for fatigue. Negative for appetite change.  HENT: Positive for hearing loss and rhinorrhea. Negative for congestion and sore throat.   Eyes: Negative.    Respiratory: Positive for cough and shortness of breath. Negative for chest tightness.   Cardiovascular: Negative for chest pain, palpitations and leg swelling.  Gastrointestinal: Negative for abdominal distention and anal bleeding.  Endocrine: Negative.   Genitourinary: Negative.   Musculoskeletal: Positive for arthralgias (left knee). Negative for back pain.  Skin: Negative.   Allergic/Immunologic: Negative.   Neurological: Negative for dizziness and light-headedness.  Hematological: Negative for adenopathy. Does not bruise/bleed easily.  Psychiatric/Behavioral: Positive for sleep disturbance (takes awhile to fall asleep with oxygen at 2L at bedtime). Negative for dysphoric mood. The patient is not nervous/anxious.    Vitals:   04/04/16 1236 04/04/16 1252  BP: (!) 93/58 110/60  Pulse: 70   Resp: 18   SpO2: 90%   Weight: 196 lb (88.9 kg)   Height: 5\' 8"  (1.727 m)     Wt Readings from Last 3 Encounters:  04/04/16 196 lb (88.9 kg)  03/20/16 197 lb 8 oz (89.6 kg)  02/21/16 192 lb (87.1 kg)    Lab Results  Component Value Date   CREATININE 1.87 (H) 02/21/2016   CREATININE 1.98 (H) 11/26/2015   CREATININE 1.99 (H) 11/18/2015    Physical Exam  Constitutional: He is oriented to person, place, and time. He appears well-developed and well-nourished.  HENT:  Head: Normocephalic and atraumatic.  Eyes: Conjunctivae are normal. Pupils are equal, round, and reactive to light.  Neck: Normal range of motion. Neck supple.  Cardiovascular: Normal rate and regular rhythm.   Pulmonary/Chest: Effort normal. He has no wheezes. He has no rales.  Abdominal: Soft. He exhibits no distension. There is no tenderness.  Musculoskeletal: He exhibits no edema or tenderness.  Neurological: He is alert and oriented to person, place, and time.  Skin: Skin is warm and dry.  Psychiatric: He has a normal mood and affect. His behavior is normal. Thought content normal.  Nursing note and vitals  reviewed.   Assessment & Plan:  1: Chronic heart failure with preserved ejection fraction- - NYHA class III - euvolemic - continue daily weight and call for an overnight weight gain of >2 pounds or a weekly weight gain of >5 pounds - using NoSalt for seasoning - does go to [employer] daily for lunch - no edema today - received his flu vaccine for the season already  2: HTN- - BP low initially but better on recheck  3: COPD- - no wheezing heard today - does use his nebulizer as needed  4: Diabetes- - glucose today was 154 - follows closely with PCP regarding this.  Return here in 3 months or sooner for any questions/problems before then.

## 2016-04-04 NOTE — Patient Instructions (Signed)
Continue weighing daily and call for an overnight weight gain of > 2 pounds or a weekly weight gain of >5 pounds. 

## 2016-04-07 ENCOUNTER — Other Ambulatory Visit: Payer: Self-pay | Admitting: Internal Medicine

## 2016-04-22 DIAGNOSIS — C44212 Basal cell carcinoma of skin of right ear and external auricular canal: Secondary | ICD-10-CM | POA: Diagnosis not present

## 2016-04-24 ENCOUNTER — Other Ambulatory Visit: Payer: Self-pay | Admitting: Internal Medicine

## 2016-04-24 NOTE — Telephone Encounter (Signed)
Approved: #30 x 0 Have him do it monthly now

## 2016-04-24 NOTE — Telephone Encounter (Signed)
Left refill on voice mail at pharmacy  

## 2016-04-24 NOTE — Telephone Encounter (Signed)
Last OV 02-21-16 Next OV 08-28-16.  Last filled 03-31-16 #30. Pt always asks a few days early to make sure the pharmacy has it. He last asked for rx on 03-22-16 but did not fill until 03-31-16.

## 2016-04-30 ENCOUNTER — Other Ambulatory Visit: Payer: Self-pay

## 2016-04-30 MED ORDER — "INSULIN SYRINGE-NEEDLE U-100 30G X 1/2"" 0.5 ML MISC": 2 refills | Status: DC

## 2016-04-30 NOTE — Telephone Encounter (Signed)
Pt came by office and is out of insulin needles; pt had a friend who gave him a lot of insulin needles so pt has not gotten filled in long time; found insulin needles on hx med list and sent to [place].

## 2016-05-21 ENCOUNTER — Other Ambulatory Visit: Payer: Self-pay | Admitting: Internal Medicine

## 2016-05-25 ENCOUNTER — Other Ambulatory Visit: Payer: Self-pay | Admitting: Internal Medicine

## 2016-05-27 DIAGNOSIS — T814XXA Infection following a procedure, initial encounter: Secondary | ICD-10-CM | POA: Diagnosis not present

## 2016-05-27 NOTE — Telephone Encounter (Signed)
Left refill on voice mail at pharmacy  

## 2016-05-27 NOTE — Telephone Encounter (Signed)
Last filled 04-30-16 #30 Last OV 02-21-16 Next OV 08-28-16 He always asks for it early to make sure the pharmacy has it in stock when needed

## 2016-05-27 NOTE — Telephone Encounter (Signed)
Approved: 30 x 0 

## 2016-06-16 DIAGNOSIS — Z08 Encounter for follow-up examination after completed treatment for malignant neoplasm: Secondary | ICD-10-CM | POA: Diagnosis not present

## 2016-06-16 DIAGNOSIS — L570 Actinic keratosis: Secondary | ICD-10-CM | POA: Diagnosis not present

## 2016-06-16 DIAGNOSIS — Z85828 Personal history of other malignant neoplasm of skin: Secondary | ICD-10-CM | POA: Diagnosis not present

## 2016-06-16 DIAGNOSIS — X32XXXA Exposure to sunlight, initial encounter: Secondary | ICD-10-CM | POA: Diagnosis not present

## 2016-06-17 ENCOUNTER — Other Ambulatory Visit: Payer: Self-pay

## 2016-06-17 DIAGNOSIS — J9611 Chronic respiratory failure with hypoxia: Secondary | ICD-10-CM | POA: Diagnosis not present

## 2016-06-17 DIAGNOSIS — R0602 Shortness of breath: Secondary | ICD-10-CM | POA: Diagnosis not present

## 2016-06-17 DIAGNOSIS — J449 Chronic obstructive pulmonary disease, unspecified: Secondary | ICD-10-CM | POA: Diagnosis not present

## 2016-06-17 MED ORDER — GLUCOSE BLOOD VI STRP: ORAL_STRIP | 5 refills | Status: DC

## 2016-06-17 NOTE — Telephone Encounter (Signed)
Pt request prodigy no coding test strips to [facility] [address] [place]. Pt test bid. Pt will ck with pharmacy on 06/18/16.

## 2016-06-19 NOTE — Telephone Encounter (Signed)
Pt left v/m that he went to [facility] 03/18/17 to pick up test strips and was told a form had to be filled out by PCP. [facility] faxed form;form completed and faxed to [phone]. I spoke with Lurline Idol at [facility] and form received and pt can pick up test strips this morning at 10:30.pt notified via v/m left on phone per DPR.

## 2016-06-21 ENCOUNTER — Other Ambulatory Visit: Payer: Self-pay | Admitting: Internal Medicine

## 2016-06-24 ENCOUNTER — Other Ambulatory Visit: Payer: Self-pay | Admitting: Internal Medicine

## 2016-06-25 DIAGNOSIS — E1142 Type 2 diabetes mellitus with diabetic polyneuropathy: Secondary | ICD-10-CM | POA: Diagnosis not present

## 2016-06-25 DIAGNOSIS — B351 Tinea unguium: Secondary | ICD-10-CM | POA: Diagnosis not present

## 2016-06-25 NOTE — Telephone Encounter (Signed)
Left refill on voice mail at pharmacy Stated refill on or after 06/29/16

## 2016-06-25 NOTE — Telephone Encounter (Signed)
Last filled 05-30-16 #30 Last OV 02-21-16 Next OV 08-28-16

## 2016-06-25 NOTE — Telephone Encounter (Signed)
Approved: 30 x 0 

## 2016-07-07 ENCOUNTER — Ambulatory Visit: Payer: Medicare Other | Admitting: Family

## 2016-07-07 ENCOUNTER — Telehealth: Payer: Self-pay | Admitting: Family

## 2016-07-07 NOTE — Telephone Encounter (Signed)
Patient did not show for his [facility] appointment on 07/07/16. Will attempt to reschedule.

## 2016-07-09 DIAGNOSIS — M1712 Unilateral primary osteoarthritis, left knee: Secondary | ICD-10-CM | POA: Diagnosis not present

## 2016-07-09 DIAGNOSIS — M25512 Pain in left shoulder: Secondary | ICD-10-CM | POA: Diagnosis not present

## 2016-07-09 DIAGNOSIS — M7542 Impingement syndrome of left shoulder: Secondary | ICD-10-CM | POA: Diagnosis not present

## 2016-07-09 DIAGNOSIS — M7582 Other shoulder lesions, left shoulder: Secondary | ICD-10-CM | POA: Diagnosis not present

## 2016-07-10 DIAGNOSIS — M25512 Pain in left shoulder: Secondary | ICD-10-CM | POA: Diagnosis not present

## 2016-07-10 DIAGNOSIS — M7542 Impingement syndrome of left shoulder: Secondary | ICD-10-CM | POA: Diagnosis not present

## 2016-07-10 DIAGNOSIS — M7582 Other shoulder lesions, left shoulder: Secondary | ICD-10-CM | POA: Diagnosis not present

## 2016-07-10 DIAGNOSIS — M1712 Unilateral primary osteoarthritis, left knee: Secondary | ICD-10-CM | POA: Diagnosis not present

## 2016-07-15 ENCOUNTER — Telehealth: Payer: Self-pay | Admitting: Internal Medicine

## 2016-07-15 DIAGNOSIS — M2392 Unspecified internal derangement of left knee: Secondary | ICD-10-CM | POA: Diagnosis not present

## 2016-07-15 NOTE — Telephone Encounter (Signed)
Please check on him tomorrow to see if he is getting better

## 2016-07-15 NOTE — Telephone Encounter (Signed)
Patient Name: Miguel Huber  DOB: 29-Jun-1927    Initial Comment . Caller states c/o diarrhea x 6 days.    Nurse Assessment  Nurse: Renie Ora, RN, Ashby Dawes Date/Time (Eastern Time): 07/15/2016 10:40:22 AM  Confirm and document reason for call. If symptomatic, describe symptoms. ---Caller states that he has had diarrhea for the past 6 days. Denies any other symptoms. States he has 2-3 episodes of diarrhea per day. States it is watery.  Does the patient have any new or worsening symptoms? ---Yes  Will a triage be completed? ---Yes  Related visit to physician within the last 2 weeks? ---No  Does the PT have any chronic conditions? (i.e. diabetes, asthma, etc.) ---Yes  List chronic conditions. ---Diabetes, high cholesterol, high blood pressure  Is this a behavioral health or substance abuse call? ---No     Guidelines    Guideline Title Affirmed Question Affirmed Notes  Diarrhea MILD-MODERATE diarrhea (e.g., 1-6 times / day more than normal) (all triage questions negative)    Final Disposition User   [place], RN, Ashby Dawes    Comments  No voice mail box set up on primary #. Unable to leave message.  Secondary # is not patient's direct number. Man who answered said he was not with him. Call primary # instead.   Disagree/Comply: Comply

## 2016-07-15 NOTE — Telephone Encounter (Signed)
PLEASE NOTE: All timestamps contained within this report are represented as [place] Standard Time. CONFIDENTIALTY NOTICE: This fax transmission is intended only for the addressee. It contains information that is legally privileged, confidential or otherwise protected from use or disclosure. If you are not the intended recipient, you are strictly prohibited from reviewing, disclosing, copying using or disseminating any of this information or taking any action in reliance on or regarding this information. If you have received this fax in error, please notify us immediately by telephone so that we can arrange for its return to [place]. Phone: [phone], Toll-Free: [phone], Fax: [phone] Page: 1 of 2 Call Id: [id number] [place] Patient Name: Miguel Huber Gender: Male DOB: [birth year] Age: 83 Y 3 M 20 D Return Phone Number: [phone] (Primary), [phone] (Secondary) City/State/Zip: [place] [facility] - Client Client Site [facility] - Day Physician Viviana Simpler - MD Who Is Calling Patient / Member / Family / Caregiver Call Type Triage / Clinical Relationship To Patient Self Return Phone Number [phone] (Primary) Chief Complaint Diarrhea Reason for Call Symptomatic / Request for Health Information Initial Comment Courtesy Call Made - reached voicemail. Caller states c/o diarrhea x 6 days. Appointment Disposition EMR Appointment Not Necessary Info pasted into Epic Yes Nurse Assessment Nurse: Renie Ora, RN, Ashby Dawes Date/Time Eilene Ghazi Time): 07/15/2016 10:40:22 AM Confirm and document reason for call. If symptomatic, describe symptoms. ---Caller states that he has had diarrhea for the past 6 days. Denies any other symptoms. States he has 2-3 episodes of diarrhea per day. States it is watery. Does the PT have any chronic conditions?  (i.e. diabetes, asthma, etc.) ---Yes List chronic conditions. ---Diabetes, high cholesterol, high blood pressure Guidelines Guideline Title Affirmed Question Diarrhea MILD-MODERATE diarrhea (e.g., 1-6 times / day more than normal) (all triage questions negative) Disp. Time Eilene Ghazi Time) Disposition Final User 07/15/2016 10:50:51 AM Home Care Yes Renie Ora, RN, [facility] Advice Given Per Guideline HOME CARE: You should be able to treat this at home. * Sometimes the cause is an infection caused by a virus ('stomach flu) or a bacteria. Diarrhea is one of the body's way of getting rid of germs. REASSURANCE: * Certain foods (e.g. dairy products, supplements like Ensure) can also trigger diarrhea. * In some patients, the exact cause is never found. * Staying well-hydrated is the key for adults with diarrhea. From what you have told me, it sounds like you are not severely dehydrated at this point. * Here is some general care advice that should help. EXPECTED COURSE: Viral diarrhea lasts 4-7 days. Always worse on days 1 and 2. CALL BACK IF: * Diarrhea persists over 7 days * You become worse. * Signs of dehydration occur (e.g., no urine over 12 hours, very dry mouth, lightheaded, etc.) CARE ADVICE given per Diarrhea (Adult) guideline. CONTAGIOUSNESS: * Be certain to wash your hands after using the restroom. OTC MEDS - Loperamide (Imodium AD): * Helps reduce diarrhea. * Do not use for more than 2 days. * Maximum dosage: 16 mg (8 capsules or 16 teaspoons or 80 ml). * 1 capsule = 2 mg; 1 teaspoon (5 ml) = 1 mg. * Adult dosage: 4 mg (2 capsules or 4 teaspoons or 20 ml) is the recommended first dose. You may take an additional 2 mg (1 capsule or 2 teaspoons or 10 ml) after each loose BM. OTC MEDS - Bismuth Subsalicylate (e.g., Kaopectate,  Pepto-Bismol): * Helps reduce diarrhea, vomiting, and abdominal cramping. * Adult dosage: two tablets or two tablespoons by mouth every hour (if diarrhea continues) to a  maximum of 8 doses in a 24 hour period. * Do not use for more than 2 days. * Read and follow the package instructions carefully. * Read and follow the package instructions carefully. FLUID THERAPY DURING MILD-MODERATE PLEASE NOTE: All timestamps contained within this report are represented as [place] Standard Time. CONFIDENTIALTY NOTICE: This fax transmission is intended only for the addressee. It contains information that is legally privileged, confidential or otherwise protected from use or disclosure. If you are not the intended recipient, you are strictly prohibited from reviewing, disclosing, copying using or disseminating any of this information or taking any action in reliance on or regarding this information. If you have received this fax in error, please notify us immediately by telephone so that we can arrange for its return to [place]. Phone: [phone], Toll-Free: [phone], Fax: [phone] Page: 2 of 2 Call Id: [id number] Care Advice Given Per Guideline DIARRHEA: * Drink more fluids, at least 8-10 cups daily. One cup equals 8 oz (240 ml). * Avoid caffeinated beverages (Reason: caffeine is mildly dehydrating). FOOD AND NUTRITION DURING MILD-MODERATE DIARRHEA * Maintaining some food intake during episodes of diarrhea is important. * Begin with boiled starches / cereals (e.g., potatoes, rice, noodles, wheat, oats) with a small amount of salt to taste. * Other foods that are OK include: bananas, yogurt, crackers, soup. * As the diarrhea starts to get better, you can slowly return to a normal diet. CAUTION - Loperamide (Imodium AD): CAUTION - Bismuth Subsalicylate (e.g., Kaopectate, Pepto-Bismol): * If your work is cooking, Research scientist (medical), serving or preparing food, then you should not work until the diarrhea has completely stopped. Comments User: Almira Coaster, RN Date/Time (Eastern Time): 07/15/2016 10:22:09 AM No voice mail box set up on primary #. Unable to leave message. User: Almira Coaster, RN Date/Time Eilene Ghazi Time): 07/15/2016 10:23:54 AM Secondary # is not patient's direct number. Man who answered said he was not with him. Call primary # instead.

## 2016-07-16 NOTE — Telephone Encounter (Signed)
Called. Pt not available

## 2016-07-25 ENCOUNTER — Other Ambulatory Visit: Payer: Self-pay | Admitting: Internal Medicine

## 2016-07-25 NOTE — Telephone Encounter (Signed)
Last filled 06/25/2016. Last f/u 01/2016

## 2016-07-26 NOTE — Telephone Encounter (Signed)
Approved: 30 x 0 

## 2016-07-28 ENCOUNTER — Other Ambulatory Visit: Payer: Self-pay

## 2016-07-28 NOTE — Telephone Encounter (Signed)
Duplicate; see 0000000 note.

## 2016-07-28 NOTE — Telephone Encounter (Signed)
Rx called in to requested pharmacy 

## 2016-07-28 NOTE — Telephone Encounter (Signed)
Pt left v/m requesting # 31 of temazepam. Pt request cb when refilled to [facility].

## 2016-07-31 ENCOUNTER — Encounter: Payer: Self-pay | Admitting: Family

## 2016-07-31 ENCOUNTER — Ambulatory Visit: Payer: Medicare Other | Attending: Family | Admitting: Family

## 2016-07-31 VITALS — BP 117/60 | HR 70 | Resp 20 | Ht 68.0 in | Wt 186.1 lb

## 2016-07-31 DIAGNOSIS — E119 Type 2 diabetes mellitus without complications: Secondary | ICD-10-CM | POA: Insufficient documentation

## 2016-07-31 DIAGNOSIS — I429 Cardiomyopathy, unspecified: Secondary | ICD-10-CM | POA: Diagnosis not present

## 2016-07-31 DIAGNOSIS — I110 Hypertensive heart disease with heart failure: Secondary | ICD-10-CM | POA: Insufficient documentation

## 2016-07-31 DIAGNOSIS — I10 Essential (primary) hypertension: Secondary | ICD-10-CM

## 2016-07-31 DIAGNOSIS — Z86718 Personal history of other venous thrombosis and embolism: Secondary | ICD-10-CM | POA: Diagnosis not present

## 2016-07-31 DIAGNOSIS — Z7982 Long term (current) use of aspirin: Secondary | ICD-10-CM | POA: Diagnosis not present

## 2016-07-31 DIAGNOSIS — E785 Hyperlipidemia, unspecified: Secondary | ICD-10-CM | POA: Diagnosis not present

## 2016-07-31 DIAGNOSIS — J449 Chronic obstructive pulmonary disease, unspecified: Secondary | ICD-10-CM | POA: Insufficient documentation

## 2016-07-31 DIAGNOSIS — N289 Disorder of kidney and ureter, unspecified: Secondary | ICD-10-CM | POA: Insufficient documentation

## 2016-07-31 DIAGNOSIS — K219 Gastro-esophageal reflux disease without esophagitis: Secondary | ICD-10-CM | POA: Diagnosis not present

## 2016-07-31 DIAGNOSIS — Z87891 Personal history of nicotine dependence: Secondary | ICD-10-CM | POA: Diagnosis not present

## 2016-07-31 DIAGNOSIS — Z794 Long term (current) use of insulin: Secondary | ICD-10-CM | POA: Insufficient documentation

## 2016-07-31 DIAGNOSIS — Z79899 Other long term (current) drug therapy: Secondary | ICD-10-CM | POA: Diagnosis not present

## 2016-07-31 DIAGNOSIS — I5032 Chronic diastolic (congestive) heart failure: Secondary | ICD-10-CM | POA: Diagnosis not present

## 2016-07-31 DIAGNOSIS — E1121 Type 2 diabetes mellitus with diabetic nephropathy: Secondary | ICD-10-CM

## 2016-07-31 NOTE — Progress Notes (Signed)
Patient ID: Miguel Huber, male    DOB: June 02, 1927, 83 y.o.   MRN: [id number]  HPI  Miguel Huber is a 83 y/o male with a history of DM, hyperlipidemia, COPD, HTN, right DVT, renal insufficiency, remote tobacco use and chronic heart failure.  Last echo was done on 11/18/15 and showed an EF of 60-65% without any regurgitation. PA pressure reported as being within the normal range.   Admitted on 11/16/15 with acute on chronic respiratory failure. Was given nebulizer treatments and discharged with prednisone taper. Oxygen was weaned down to 3L at that time. Was given IV diuretics while admitted. Was discharged after 2 days. In the ER on 08/06/15 with bronchitis.  He presents today for a follow-up visit with fatigue and shortness of breath with exertion. Denies any swelling in his lower legs. Continues to weigh himself daily and weight chart reviewed and shows his weight has ranged from 181-186 pounds. Is not adding any salt to his food and uses NoSalt for seasoning. Does go to the [facility] for lunch daily and is unsure of how much salt is in that food. Continues to have a chronic dry cough. Has been using his nebulizer twice daily.   Past Medical History:  Diagnosis Date  . Allergic rhinitis   . Cardiomyopathy   . COPD (chronic obstructive pulmonary disease) ([place])   . Diabetes mellitus   . DVT (deep venous thrombosis), right 5/13  . Erectile dysfunction   . GERD (gastroesophageal reflux disease)   . Hx of adenomatous colonic polyps   . Hyperlipidemia   . Hypertension   . Osteoarthritis   . Pulmonary fibrosis ([place])   . Renal insufficiency    Past Surgical History:  Procedure Laterality Date  . CARDIOVASCULAR STRESS TEST     Myoview stress negative EF 65-70% 08/04  . CATARACT EXTRACTION     Cataracts/ IOL OU, Epes 2005, [place] ?  2000  . LUNG BIOPSY     Right lung bx. negative 2001  . ORIF CLAVICLE FRACTURE     Left clavicle fracture- child  . ORIF ULNAR FRACTURE      Left readius/ ulna fracture child   Family History  Problem Relation Age of Onset  . Cancer Neg Hx    Social History  Substance Use Topics  . Smoking status: Former Research scientist (life sciences)  . Smokeless tobacco: Never Used  . Alcohol use No   Allergies  Allergen Reactions  . Theophyllines Hives   Prior to Admission medications   Medication Sig Start Date End Date Taking? Authorizing Provider  aspirin EC 81 MG tablet Take 81 mg by mouth daily.   Yes Historical Provider, MD  cholecalciferol (VITAMIN D) 1000 units tablet Take 1,000 Units by mouth daily.   Yes Historical Provider, MD  furosemide (LASIX) 80 MG tablet TAKE 1 TABLET BY MOUTH EVERY DAY 04/03/16  Yes Venia Carbon, MD  glucose blood (PRODIGY NO CODING BLOOD GLUC) test strip Check blood sugar twice a day and as directed. Dx E11.29. 06/17/16  Yes Venia Carbon, MD  insulin regular (NOVOLIN R,HUMULIN R) 100 units/mL injection Inject 0.1 mLs (10 Units total) into the skin 2 (two) times daily before a meal. 11/26/15  Yes Venia Carbon, MD  Insulin Syringe-Needle U-100 (B-D INS SYRINGE 0.5CC/30GX1/2") 30G X 1/2" 0.5 ML MISC Use as directed to inject insulin dx:E11.29 04/30/16  Yes Venia Carbon, MD  ipratropium-albuterol (DUONEB) 0.5-2.5 (3) MG/3ML SOLN Take 3 mLs by nebulization every 6 (six)  hours as needed (for wheezing/shortness of breath).    Yes Historical Provider, MD  ipratropium-albuterol (DUONEB) 0.5-2.5 (3) MG/3ML SOLN NEBULIZE 1 VIAL VIA NEBULIZER 4 TIMES A DAY AS NEEDED *DX J43.9* 05/21/16  Yes Venia Carbon, MD  lisinopril (PRINIVIL,ZESTRIL) 10 MG tablet Take 10 mg by mouth daily.   Yes Historical Provider, MD  NOVOLIN N 100 UNIT/ML injection INJECT 0.25 MLS (25 UNITS TOTAL) INTO THE SKIN 2 (TWO) TIMES DAILY. 04/07/16  Yes Venia Carbon, MD  NOVOLIN R 100 UNIT/ML injection INJECT 0.1 MLS (10 UNITS TOTAL) INTO THE SKIN 2 (TWO) TIMES DAILY BEFORE A MEAL. DX: E11.40 06/23/16  Yes Venia Carbon, MD  omeprazole (PRILOSEC) 20 MG  capsule Take 1 capsule (20 mg total) by mouth daily. 11/29/15  Yes Venia Carbon, MD  polyethylene glycol [facility] / GLYCOLAX) packet Take 17 g by mouth daily.   Yes Historical Provider, MD  rosuvastatin (CRESTOR) 20 MG tablet Take 20 mg by mouth at bedtime.   Yes Historical Provider, MD  temazepam (RESTORIL) 30 MG capsule TAKE ONE CAPSULE BY MOUTH AT BEDTIME AS NEEDED 07/28/16  Yes Venia Carbon, MD   Review of Systems  Constitutional: Positive for fatigue. Negative for appetite change.  HENT: Negative for congestion, postnasal drip and sore throat.   Eyes: Negative.   Respiratory: Positive for cough, chest tightness, shortness of breath and wheezing.   Cardiovascular: Negative for chest pain, palpitations and leg swelling.  Gastrointestinal: Negative for abdominal distention and abdominal pain.  Endocrine: Negative.   Genitourinary: Negative.   Musculoskeletal: Negative for back pain and neck pain.  Skin: Negative.   Allergic/Immunologic: Negative.   Neurological: Negative for dizziness and light-headedness.  Hematological: Negative for adenopathy. Does not bruise/bleed easily.  Psychiatric/Behavioral: Negative for dysphoric mood, sleep disturbance (sleeping well with sleeping pill) and suicidal ideas. The patient is not nervous/anxious.    Vitals:   07/31/16 1243  BP: 117/60  Pulse: 70  Resp: 20  SpO2: 96%  Weight: 186 lb 2 oz (84.4 kg)  Height: 5\' 8"  (1.727 m)   Wt Readings from Last 3 Encounters:  07/31/16 186 lb 2 oz (84.4 kg)  04/04/16 196 lb (88.9 kg)  03/20/16 197 lb 8 oz (89.6 kg)   Lab Results  Component Value Date   CREATININE 1.87 (H) 02/21/2016   CREATININE 1.98 (H) 11/26/2015   CREATININE 1.99 (H) 11/18/2015    Physical Exam  Constitutional: He is oriented to person, place, and time. He appears well-developed and well-nourished.  HENT:  Head: Normocephalic and atraumatic.  Eyes: Conjunctivae are normal. Pupils are equal, round, and reactive to light.   Neck: Normal range of motion. Neck supple. No JVD present.  Cardiovascular: Normal rate and regular rhythm.   Pulmonary/Chest: Effort normal. He has wheezes in the right lower field and the left lower field. He has no rales.  Abdominal: Soft. He exhibits no distension. There is no tenderness.  Musculoskeletal: He exhibits no edema or tenderness.  Neurological: He is alert and oriented to person, place, and time.  Skin: Skin is warm and dry.  Psychiatric: He has a normal mood and affect. His behavior is normal. Thought content normal.  Nursing note and vitals reviewed.   Assessment & Plan:  1: Chronic heart failure with preserved ejection fraction- - NYHA class III - euvolemic - continue daily weight and call for an overnight weight gain of >2 pounds or a weekly weight gain of >5 pounds. Weight is down 10 pounds  from last time he was here - using NoSalt for seasoning - does go to [facility] daily for lunch - no edema today - received his flu vaccine for the season already  2: HTN- - BP looks good today  3: COPD- - wheezing heard in lower lobes today - has been using nebulizer twice daily and encouraged him to use it three times daily for the next couple of day - wearing oxygen at 2L at home PRN and at bedtime  4: Diabetes- - glucose today was 142 - sees PCP Silvio Pate) 08/28/16  Medication list was reviewed.  Return here in 3 months or sooner for any questions/problems before then.

## 2016-07-31 NOTE — Patient Instructions (Signed)
Continue weighing daily and call for an overnight weight gain of > 2 pounds or a weekly weight gain of >5 pounds. 

## 2016-08-06 ENCOUNTER — Emergency Department: Payer: Medicare Other

## 2016-08-06 ENCOUNTER — Inpatient Hospital Stay
Admission: EM | Admit: 2016-08-06 | Discharge: 2016-08-17 | DRG: 871 | Disposition: A | Discharge status: Hospice | Payer: Medicare Other | Attending: Internal Medicine | Admitting: Internal Medicine

## 2016-08-06 DIAGNOSIS — R0600 Dyspnea, unspecified: Secondary | ICD-10-CM | POA: Diagnosis not present

## 2016-08-06 DIAGNOSIS — E785 Hyperlipidemia, unspecified: Secondary | ICD-10-CM | POA: Diagnosis present

## 2016-08-06 DIAGNOSIS — R55 Syncope and collapse: Secondary | ICD-10-CM | POA: Diagnosis present

## 2016-08-06 DIAGNOSIS — J9621 Acute and chronic respiratory failure with hypoxia: Secondary | ICD-10-CM | POA: Diagnosis present

## 2016-08-06 DIAGNOSIS — Z794 Long term (current) use of insulin: Secondary | ICD-10-CM

## 2016-08-06 DIAGNOSIS — E1122 Type 2 diabetes mellitus with diabetic chronic kidney disease: Secondary | ICD-10-CM | POA: Diagnosis present

## 2016-08-06 DIAGNOSIS — Z9981 Dependence on supplemental oxygen: Secondary | ICD-10-CM

## 2016-08-06 DIAGNOSIS — K219 Gastro-esophageal reflux disease without esophagitis: Secondary | ICD-10-CM | POA: Diagnosis present

## 2016-08-06 DIAGNOSIS — A419 Sepsis, unspecified organism: Secondary | ICD-10-CM

## 2016-08-06 DIAGNOSIS — N179 Acute kidney failure, unspecified: Secondary | ICD-10-CM | POA: Diagnosis not present

## 2016-08-06 DIAGNOSIS — J84112 Idiopathic pulmonary fibrosis: Secondary | ICD-10-CM | POA: Diagnosis present

## 2016-08-06 DIAGNOSIS — M25552 Pain in left hip: Secondary | ICD-10-CM | POA: Diagnosis present

## 2016-08-06 DIAGNOSIS — Z66 Do not resuscitate: Secondary | ICD-10-CM | POA: Diagnosis not present

## 2016-08-06 DIAGNOSIS — I2510 Atherosclerotic heart disease of native coronary artery without angina pectoris: Secondary | ICD-10-CM | POA: Diagnosis present

## 2016-08-06 DIAGNOSIS — T380X5A Adverse effect of glucocorticoids and synthetic analogues, initial encounter: Secondary | ICD-10-CM | POA: Diagnosis present

## 2016-08-06 DIAGNOSIS — M25551 Pain in right hip: Secondary | ICD-10-CM | POA: Diagnosis present

## 2016-08-06 DIAGNOSIS — D72829 Elevated white blood cell count, unspecified: Secondary | ICD-10-CM | POA: Diagnosis not present

## 2016-08-06 DIAGNOSIS — Z7982 Long term (current) use of aspirin: Secondary | ICD-10-CM

## 2016-08-06 DIAGNOSIS — N189 Chronic kidney disease, unspecified: Secondary | ICD-10-CM | POA: Diagnosis present

## 2016-08-06 DIAGNOSIS — I429 Cardiomyopathy, unspecified: Secondary | ICD-10-CM | POA: Diagnosis present

## 2016-08-06 DIAGNOSIS — N183 Chronic kidney disease, stage 3 (moderate): Secondary | ICD-10-CM | POA: Diagnosis present

## 2016-08-06 DIAGNOSIS — Z86010 Personal history of colonic polyps: Secondary | ICD-10-CM

## 2016-08-06 DIAGNOSIS — M1990 Unspecified osteoarthritis, unspecified site: Secondary | ICD-10-CM | POA: Diagnosis present

## 2016-08-06 DIAGNOSIS — R0602 Shortness of breath: Secondary | ICD-10-CM | POA: Diagnosis not present

## 2016-08-06 DIAGNOSIS — E8770 Fluid overload, unspecified: Secondary | ICD-10-CM

## 2016-08-06 DIAGNOSIS — I959 Hypotension, unspecified: Secondary | ICD-10-CM

## 2016-08-06 DIAGNOSIS — Z515 Encounter for palliative care: Secondary | ICD-10-CM | POA: Diagnosis not present

## 2016-08-06 DIAGNOSIS — I272 Pulmonary hypertension, unspecified: Secondary | ICD-10-CM

## 2016-08-06 DIAGNOSIS — Y95 Nosocomial condition: Secondary | ICD-10-CM | POA: Diagnosis present

## 2016-08-06 DIAGNOSIS — J209 Acute bronchitis, unspecified: Secondary | ICD-10-CM | POA: Diagnosis present

## 2016-08-06 DIAGNOSIS — R059 Cough, unspecified: Secondary | ICD-10-CM

## 2016-08-06 DIAGNOSIS — J189 Pneumonia, unspecified organism: Secondary | ICD-10-CM | POA: Diagnosis present

## 2016-08-06 DIAGNOSIS — J439 Emphysema, unspecified: Secondary | ICD-10-CM | POA: Diagnosis present

## 2016-08-06 DIAGNOSIS — Z79899 Other long term (current) drug therapy: Secondary | ICD-10-CM

## 2016-08-06 DIAGNOSIS — R1310 Dysphagia, unspecified: Secondary | ICD-10-CM | POA: Diagnosis present

## 2016-08-06 DIAGNOSIS — I130 Hypertensive heart and chronic kidney disease with heart failure and stage 1 through stage 4 chronic kidney disease, or unspecified chronic kidney disease: Secondary | ICD-10-CM | POA: Diagnosis present

## 2016-08-06 DIAGNOSIS — R531 Weakness: Secondary | ICD-10-CM | POA: Diagnosis not present

## 2016-08-06 DIAGNOSIS — Z888 Allergy status to other drugs, medicaments and biological substances status: Secondary | ICD-10-CM

## 2016-08-06 DIAGNOSIS — Z87891 Personal history of nicotine dependence: Secondary | ICD-10-CM

## 2016-08-06 DIAGNOSIS — R05 Cough: Secondary | ICD-10-CM

## 2016-08-06 DIAGNOSIS — R0603 Acute respiratory distress: Secondary | ICD-10-CM

## 2016-08-06 DIAGNOSIS — E1165 Type 2 diabetes mellitus with hyperglycemia: Secondary | ICD-10-CM | POA: Diagnosis present

## 2016-08-06 DIAGNOSIS — I509 Heart failure, unspecified: Secondary | ICD-10-CM | POA: Diagnosis present

## 2016-08-06 LAB — BASIC METABOLIC PANEL
Anion gap: 7 (ref 5–15)
BUN: 61 mg/dL — ABNORMAL HIGH (ref 6–20)
CALCIUM: 8.5 mg/dL — AB (ref 8.9–10.3)
CHLORIDE: 107 mmol/L (ref 101–111)
CO2: 26 mmol/L (ref 22–32)
CREATININE: 2.48 mg/dL — AB (ref 0.61–1.24)
GFR calc non Af Amer: 22 mL/min — ABNORMAL LOW (ref >60)
GFR, EST AFRICAN AMERICAN: 25 mL/min — AB (ref >60)
GLUCOSE: 167 mg/dL — AB (ref 65–99)
Potassium: 4.2 mmol/L (ref 3.5–5.1)
Sodium: 140 mmol/L (ref 135–145)

## 2016-08-06 LAB — CBC
HCT: 41.7 % (ref 40.0–52.0)
HEMOGLOBIN: 14 g/dL (ref 13.0–18.0)
MCH: 32 pg (ref 26.0–34.0)
MCHC: 33.6 g/dL (ref 32.0–36.0)
MCV: 95.2 fL (ref 80.0–100.0)
PLATELETS: 161 10*3/uL (ref 150–440)
RBC: 4.38 MIL/uL — AB (ref 4.40–5.90)
RDW: 13.4 % (ref 11.5–14.5)
WBC: 21 10*3/uL — ABNORMAL HIGH (ref 3.8–10.6)

## 2016-08-06 LAB — TROPONIN I: TROPONIN I: 0.03 ng/mL — AB (ref <0.03)

## 2016-08-06 LAB — LACTIC ACID, PLASMA: Lactic Acid, Venous: 1.9 mmol/L (ref 0.5–1.9)

## 2016-08-06 MED ORDER — PIPERACILLIN-TAZOBACTAM 3.375 G IVPB 30 MIN
3.3750 g | Freq: Once | INTRAVENOUS | Status: AC
  Administered 2016-08-06: 3.375 g via INTRAVENOUS
  Filled 2016-08-06: qty 50

## 2016-08-06 MED ORDER — VANCOMYCIN HCL IN DEXTROSE 1-5 GM/200ML-% IV SOLN
1000.0000 mg | Freq: Once | INTRAVENOUS | Status: AC
  Administered 2016-08-06: 1000 mg via INTRAVENOUS
  Filled 2016-08-06: qty 200

## 2016-08-06 MED ORDER — SODIUM CHLORIDE 0.9 % IV BOLUS (SEPSIS)
1000.0000 mL | Freq: Once | INTRAVENOUS | Status: AC
  Administered 2016-08-06: 1000 mL via INTRAVENOUS

## 2016-08-06 MED ORDER — ALBUTEROL SULFATE (2.5 MG/3ML) 0.083% IN NEBU
INHALATION_SOLUTION | RESPIRATORY_TRACT | Status: AC
  Filled 2016-08-06: qty 3

## 2016-08-06 NOTE — ED Provider Notes (Signed)
[facility] Emergency Department Provider Note   ____________________________________________   I have reviewed the triage vital signs and the nursing notes.   HISTORY  Chief Complaint Loss of Consciousness   History limited by: Not Limited   HPI Miguel Huber is a 83 y.o. male who presents to the emergency department today via EMS after a syncopal episode. The patient states that he started to feel slightly weak this evening. He went to use the bathroom. He was defecating and then passed out. He woke on the floor. Apparently when responders first got to him he was slightly less responsive. Additionally blood pressure was in the 70s. Patient has a history of heart failure. Slight worsening of shortness of breath over the past few days. Patient denies any chest pain. Denies any swelling. No recent fevers or illness.    Past Medical History:  Diagnosis Date  . Allergic rhinitis   . Cardiomyopathy   . COPD (chronic obstructive pulmonary disease) ([place])   . Diabetes mellitus   . DVT (deep venous thrombosis), right 5/13  . Erectile dysfunction   . GERD (gastroesophageal reflux disease)   . Hx of adenomatous colonic polyps   . Hyperlipidemia   . Hypertension   . Osteoarthritis   . Pulmonary fibrosis ([place])   . Renal insufficiency     Patient Active Problem List   Diagnosis Date Noted  . Hypotension 12/07/2015  . Chronic diastolic heart failure ([place]) 11/26/2015  . COPD (chronic obstructive pulmonary disease) ([place]) 11/16/2015  . Pulmonary fibrosis ([place]) 11/16/2015  . History of DVT (deep vein thrombosis) 11/16/2015  . Cardiomyopathy ([place]) 11/16/2015  . Chronic hypoxemic respiratory failure ([place]) 08/20/2015  . Routine general medical examination at a health care facility 08/02/2013  . Neuropathy ([place]) 08/02/2013  . Constipation, chronic 10/10/2011  . Type 2 diabetes, controlled, with renal manifestation ([place]) 06/06/2011  . Hypertension   .  UNSPECIFIED VENOUS INSUFFICIENCY 08/14/2010  . OSTEOARTHRITIS 04/09/2010  . BACK PAIN, LUMBAR, WITH RADICULOPATHY 06/01/2008  . Chronic kidney disease, stage IV (severe) ([place]) 04/20/2007  . Hyperlipemia 10/13/2006  . ALLERGIC RHINITIS 10/13/2006  . COPD (chronic obstructive pulmonary disease) with emphysema ([place]) 10/13/2006  . GERD 10/13/2006    Past Surgical History:  Procedure Laterality Date  . CARDIOVASCULAR STRESS TEST     Myoview stress negative EF 65-70% 08/04  . CATARACT EXTRACTION     Cataracts/ IOL OU, Epes 2005, [place] ?  2000  . LUNG BIOPSY     Right lung bx. negative 2001  . ORIF CLAVICLE FRACTURE     Left clavicle fracture- child  . ORIF ULNAR FRACTURE     Left readius/ ulna fracture child    Prior to Admission medications   Medication Sig Start Date End Date Taking? Authorizing Provider  aspirin EC 81 MG tablet Take 81 mg by mouth daily.    Historical Provider, MD  cholecalciferol (VITAMIN D) 1000 units tablet Take 1,000 Units by mouth daily.    Historical Provider, MD  furosemide (LASIX) 80 MG tablet TAKE 1 TABLET BY MOUTH EVERY DAY 04/03/16   Venia Carbon, MD  glucose blood (PRODIGY NO CODING BLOOD GLUC) test strip Check blood sugar twice a day and as directed. Dx E11.29. 06/17/16   Venia Carbon, MD  insulin regular (NOVOLIN R,HUMULIN R) 100 units/mL injection Inject 0.1 mLs (10 Units total) into the skin 2 (two) times daily before a meal. 11/26/15   Venia Carbon, MD  Insulin Syringe-Needle U-100 (B-D  INS SYRINGE 0.5CC/30GX1/2") 30G X 1/2" 0.5 ML MISC Use as directed to inject insulin dx:E11.29 04/30/16   Venia Carbon, MD  ipratropium-albuterol (DUONEB) 0.5-2.5 (3) MG/3ML SOLN Take 3 mLs by nebulization every 6 (six) hours as needed (for wheezing/shortness of breath).     Historical Provider, MD  ipratropium-albuterol (DUONEB) 0.5-2.5 (3) MG/3ML SOLN NEBULIZE 1 VIAL VIA NEBULIZER 4 TIMES A DAY AS NEEDED *DX J43.9* 05/21/16   Venia Carbon, MD   lisinopril (PRINIVIL,ZESTRIL) 10 MG tablet Take 10 mg by mouth daily.    Historical Provider, MD  NOVOLIN N 100 UNIT/ML injection INJECT 0.25 MLS (25 UNITS TOTAL) INTO THE SKIN 2 (TWO) TIMES DAILY. 04/07/16   Venia Carbon, MD  NOVOLIN R 100 UNIT/ML injection INJECT 0.1 MLS (10 UNITS TOTAL) INTO THE SKIN 2 (TWO) TIMES DAILY BEFORE A MEAL. DX: E11.40 06/23/16   Venia Carbon, MD  omeprazole (PRILOSEC) 20 MG capsule Take 1 capsule (20 mg total) by mouth daily. 11/29/15   Venia Carbon, MD  polyethylene glycol [facility] / GLYCOLAX) packet Take 17 g by mouth daily.    Historical Provider, MD  rosuvastatin (CRESTOR) 20 MG tablet Take 20 mg by mouth at bedtime.    Historical Provider, MD  temazepam (RESTORIL) 30 MG capsule TAKE ONE CAPSULE BY MOUTH AT BEDTIME AS NEEDED 07/28/16   Venia Carbon, MD    Allergies Theophyllines  Family History  Problem Relation Age of Onset  . Cancer Neg Hx     Social History Social History  Substance Use Topics  . Smoking status: Former Research scientist (life sciences)  . Smokeless tobacco: Never Used  . Alcohol use No    Review of Systems  Constitutional: Negative for fever. Cardiovascular: Negative for chest pain. Respiratory: Positive for shortness of breath. Gastrointestinal: Negative for abdominal pain, vomiting and diarrhea. Genitourinary: Negative for dysuria. Musculoskeletal: Negative for back pain. Skin: Negative for rash. Neurological: Negative for headaches, focal weakness or numbness.  10-point ROS otherwise negative.  ____________________________________________   PHYSICAL EXAM:  VITAL SIGNS: ED Triage Vitals [08/06/16 2028]  Enc Vitals Group     BP (!) 94/48     Pulse Rate 84     Resp 16     Temp 98.3 F (36.8 C)     Temp Source Oral     SpO2 94 %     Weight 186 lb (84.4 kg)     Height 5\' 8"  (1.727 m)   Constitutional: Alert and oriented. Well appearing and in no distress. Eyes: Conjunctivae are normal. Normal extraocular  movements. ENT   Head: Normocephalic and atraumatic.   Nose: No congestion/rhinnorhea.   Mouth/Throat: Mucous membranes are moist.   Neck: No stridor. Hematological/Lymphatic/Immunilogical: No cervical lymphadenopathy. Cardiovascular: Normal rate, regular rhythm.  No murmurs, rubs, or gallops.  Respiratory: Normal respiratory effort without tachypnea nor retractions. Breath sounds are clear and equal bilaterally. No wheezes/rales/rhonchi. Gastrointestinal: Soft and non tender. No rebound. No guarding.  Genitourinary: Deferred Musculoskeletal: Normal range of motion in all extremities. No lower extremity edema. Neurologic:  Normal speech and language. No gross focal neurologic deficits are appreciated.  Skin:  Skin is warm, dry and intact. No rash noted. Psychiatric: Mood and affect are normal. Speech and behavior are normal. Patient exhibits appropriate insight and judgment.  ____________________________________________    LABS (pertinent positives/negatives)  Labs Reviewed  BASIC METABOLIC PANEL - Abnormal; Notable for the following:       Result Value   Glucose, Bld 167 (*)  BUN 61 (*)    Creatinine, Ser 2.48 (*)    Calcium 8.5 (*)    GFR calc non Af Amer 22 (*)    GFR calc Af Amer 25 (*)    All other components within normal limits  CBC - Abnormal; Notable for the following:    WBC 21.0 (*)    RBC 4.38 (*)    All other components within normal limits  TROPONIN I - Abnormal; Notable for the following:    Troponin I 0.03 (*)    All other components within normal limits  CULTURE, BLOOD (ROUTINE X 2)  CULTURE, BLOOD (ROUTINE X 2)  URINE CULTURE  LACTIC ACID, PLASMA  URINALYSIS, COMPLETE (UACMP) WITH MICROSCOPIC  CBG MONITORING, ED     ____________________________________________   EKG  I, Nance Pear, attending physician, personally viewed and interpreted this EKG  EKG Time: 2027 Rate: 86 Rhythm: normal sinus rhythm Axis: left axis  deviation Intervals: qtc 486 QRS: LBBB ST changes: no st elevation Impression: abnormal ekg   ____________________________________________    RADIOLOGY  CXR   IMPRESSION:  Diffuse pulmonary fibrosis suggesting usual interstitial pneumonitis  with associated low lung volumes. Similar appearance to previous  studies.    ____________________________________________   PROCEDURES  Procedures  ____________________________________________   INITIAL IMPRESSION / ASSESSMENT AND PLAN / ED COURSE  Pertinent labs & imaging results that were available during my care of the patient were reviewed by me and considered in my medical decision making (see chart for details).  The patient presented to the emergency department today after a syncopal episode. Patient was quite hypotensive here in the emergency department. Additionally patient had a significant elevation of his white blood cell. Also some elevation of creatinine. Patient was given broad-spectrum IV antibiotics given leukocytosis and hypotension. Patient was given IV fluids. Will be admitted to the hospital service.  ____________________________________________   FINAL CLINICAL IMPRESSION(S) / ED DIAGNOSES  Final diagnoses:  Syncope, unspecified syncope type  Hypotension, unspecified hypotension type  Leukocytosis, unspecified type     Note: This dictation was prepared with Dragon dictation. Any transcriptional errors that result from this process are unintentional     Nance Pear, MD 08/07/16 [phone]

## 2016-08-06 NOTE — ED Notes (Addendum)
Pt unable to give urine sample at this time. Pts O2 incr 4L

## 2016-08-06 NOTE — ED Triage Notes (Addendum)
Pt bib EMS from home w/ c/o LOC today. Pt alert and oriented x 4 in room. EMS reports low BP. Pt denies pain or head injury. Pt sts he has had worsening sob X 3 DAYS.  CBG 178 per EMS

## 2016-08-07 ENCOUNTER — Encounter: Payer: Self-pay | Admitting: Internal Medicine

## 2016-08-07 ENCOUNTER — Inpatient Hospital Stay: Payer: Medicare Other

## 2016-08-07 ENCOUNTER — Inpatient Hospital Stay (HOSPITAL_COMMUNITY)
Admit: 2016-08-07 | Discharge: 2016-08-07 | Disposition: A | Discharge status: Home / Self Care | Payer: Medicare Other | Attending: Internal Medicine | Admitting: Internal Medicine

## 2016-08-07 DIAGNOSIS — R0603 Acute respiratory distress: Secondary | ICD-10-CM | POA: Diagnosis not present

## 2016-08-07 DIAGNOSIS — M25551 Pain in right hip: Secondary | ICD-10-CM | POA: Diagnosis present

## 2016-08-07 DIAGNOSIS — Z9911 Dependence on respirator [ventilator] status: Secondary | ICD-10-CM | POA: Diagnosis not present

## 2016-08-07 DIAGNOSIS — Y95 Nosocomial condition: Secondary | ICD-10-CM | POA: Diagnosis present

## 2016-08-07 DIAGNOSIS — R55 Syncope and collapse: Secondary | ICD-10-CM

## 2016-08-07 DIAGNOSIS — M1990 Unspecified osteoarthritis, unspecified site: Secondary | ICD-10-CM | POA: Diagnosis present

## 2016-08-07 DIAGNOSIS — R0600 Dyspnea, unspecified: Secondary | ICD-10-CM | POA: Diagnosis not present

## 2016-08-07 DIAGNOSIS — T380X5A Adverse effect of glucocorticoids and synthetic analogues, initial encounter: Secondary | ICD-10-CM | POA: Diagnosis present

## 2016-08-07 DIAGNOSIS — J84112 Idiopathic pulmonary fibrosis: Secondary | ICD-10-CM | POA: Diagnosis present

## 2016-08-07 DIAGNOSIS — R05 Cough: Secondary | ICD-10-CM | POA: Diagnosis not present

## 2016-08-07 DIAGNOSIS — K219 Gastro-esophageal reflux disease without esophagitis: Secondary | ICD-10-CM | POA: Diagnosis present

## 2016-08-07 DIAGNOSIS — R0609 Other forms of dyspnea: Secondary | ICD-10-CM | POA: Diagnosis not present

## 2016-08-07 DIAGNOSIS — E785 Hyperlipidemia, unspecified: Secondary | ICD-10-CM | POA: Diagnosis present

## 2016-08-07 DIAGNOSIS — R1310 Dysphagia, unspecified: Secondary | ICD-10-CM | POA: Diagnosis present

## 2016-08-07 DIAGNOSIS — J189 Pneumonia, unspecified organism: Secondary | ICD-10-CM | POA: Diagnosis not present

## 2016-08-07 DIAGNOSIS — J9690 Respiratory failure, unspecified, unspecified whether with hypoxia or hypercapnia: Secondary | ICD-10-CM | POA: Diagnosis not present

## 2016-08-07 DIAGNOSIS — N189 Chronic kidney disease, unspecified: Secondary | ICD-10-CM | POA: Diagnosis present

## 2016-08-07 DIAGNOSIS — I509 Heart failure, unspecified: Secondary | ICD-10-CM | POA: Diagnosis present

## 2016-08-07 DIAGNOSIS — I429 Cardiomyopathy, unspecified: Secondary | ICD-10-CM | POA: Diagnosis present

## 2016-08-07 DIAGNOSIS — Z66 Do not resuscitate: Secondary | ICD-10-CM | POA: Diagnosis not present

## 2016-08-07 DIAGNOSIS — I959 Hypotension, unspecified: Secondary | ICD-10-CM | POA: Diagnosis present

## 2016-08-07 DIAGNOSIS — J439 Emphysema, unspecified: Secondary | ICD-10-CM | POA: Diagnosis present

## 2016-08-07 DIAGNOSIS — J9621 Acute and chronic respiratory failure with hypoxia: Secondary | ICD-10-CM | POA: Diagnosis not present

## 2016-08-07 DIAGNOSIS — N179 Acute kidney failure, unspecified: Secondary | ICD-10-CM | POA: Diagnosis present

## 2016-08-07 DIAGNOSIS — Z515 Encounter for palliative care: Secondary | ICD-10-CM | POA: Diagnosis not present

## 2016-08-07 DIAGNOSIS — R0602 Shortness of breath: Secondary | ICD-10-CM | POA: Diagnosis not present

## 2016-08-07 DIAGNOSIS — I130 Hypertensive heart and chronic kidney disease with heart failure and stage 1 through stage 4 chronic kidney disease, or unspecified chronic kidney disease: Secondary | ICD-10-CM | POA: Diagnosis present

## 2016-08-07 DIAGNOSIS — I272 Pulmonary hypertension, unspecified: Secondary | ICD-10-CM | POA: Diagnosis present

## 2016-08-07 DIAGNOSIS — A419 Sepsis, unspecified organism: Secondary | ICD-10-CM | POA: Diagnosis not present

## 2016-08-07 DIAGNOSIS — J8410 Pulmonary fibrosis, unspecified: Secondary | ICD-10-CM | POA: Diagnosis not present

## 2016-08-07 DIAGNOSIS — J209 Acute bronchitis, unspecified: Secondary | ICD-10-CM | POA: Diagnosis present

## 2016-08-07 DIAGNOSIS — N183 Chronic kidney disease, stage 3 (moderate): Secondary | ICD-10-CM | POA: Diagnosis present

## 2016-08-07 DIAGNOSIS — E1122 Type 2 diabetes mellitus with diabetic chronic kidney disease: Secondary | ICD-10-CM | POA: Diagnosis present

## 2016-08-07 DIAGNOSIS — J9601 Acute respiratory failure with hypoxia: Secondary | ICD-10-CM | POA: Diagnosis not present

## 2016-08-07 DIAGNOSIS — E1165 Type 2 diabetes mellitus with hyperglycemia: Secondary | ICD-10-CM | POA: Diagnosis present

## 2016-08-07 LAB — TROPONIN I
TROPONIN I: 0.03 ng/mL — AB (ref <0.03)
TROPONIN I: 0.03 ng/mL — AB (ref <0.03)
Troponin I: 0.03 ng/mL (ref <0.03)

## 2016-08-07 LAB — BASIC METABOLIC PANEL
Anion gap: 8 (ref 5–15)
BUN: 55 mg/dL — ABNORMAL HIGH (ref 6–20)
CO2: 24 mmol/L (ref 22–32)
CREATININE: 2.22 mg/dL — AB (ref 0.61–1.24)
Calcium: 8.3 mg/dL — ABNORMAL LOW (ref 8.9–10.3)
Chloride: 110 mmol/L (ref 101–111)
GFR, EST AFRICAN AMERICAN: 29 mL/min — AB (ref >60)
GFR, EST NON AFRICAN AMERICAN: 25 mL/min — AB (ref >60)
Glucose, Bld: 217 mg/dL — ABNORMAL HIGH (ref 65–99)
Potassium: 4 mmol/L (ref 3.5–5.1)
SODIUM: 142 mmol/L (ref 135–145)

## 2016-08-07 LAB — CBC
HCT: 40.8 % (ref 40.0–52.0)
Hemoglobin: 13.6 g/dL (ref 13.0–18.0)
MCH: 32.2 pg (ref 26.0–34.0)
MCHC: 33.4 g/dL (ref 32.0–36.0)
MCV: 96.3 fL (ref 80.0–100.0)
PLATELETS: 142 10*3/uL — AB (ref 150–440)
RBC: 4.23 MIL/uL — ABNORMAL LOW (ref 4.40–5.90)
RDW: 13.9 % (ref 11.5–14.5)
WBC: 16.7 10*3/uL — AB (ref 3.8–10.6)

## 2016-08-07 LAB — URINALYSIS, COMPLETE (UACMP) WITH MICROSCOPIC
BILIRUBIN URINE: NEGATIVE
Glucose, UA: NEGATIVE mg/dL
KETONES UR: NEGATIVE mg/dL
Leukocytes, UA: NEGATIVE
NITRITE: NEGATIVE
PROTEIN: 30 mg/dL — AB
SPECIFIC GRAVITY, URINE: 1.016 (ref 1.005–1.030)
pH: 5 (ref 5.0–8.0)

## 2016-08-07 LAB — GLUCOSE, CAPILLARY
GLUCOSE-CAPILLARY: 321 mg/dL — AB (ref 65–99)
Glucose-Capillary: 279 mg/dL — ABNORMAL HIGH (ref 65–99)

## 2016-08-07 MED ORDER — DEXTROSE 5 % IV SOLN
500.0000 mg | Freq: Once | INTRAVENOUS | Status: AC
  Administered 2016-08-07: 500 mg via INTRAVENOUS
  Filled 2016-08-07: qty 500

## 2016-08-07 MED ORDER — ENOXAPARIN SODIUM 40 MG/0.4ML ~~LOC~~ SOLN: 40.0000 mg | SUBCUTANEOUS | Status: DC

## 2016-08-07 MED ORDER — ACETAMINOPHEN 650 MG RE SUPP: 650.0000 mg | Freq: Four times a day (QID) | RECTAL | Status: DC | PRN

## 2016-08-07 MED ORDER — DEXTROSE 5 % IV SOLN
1.0000 g | INTRAVENOUS | Status: DC
  Administered 2016-08-08: 1 g via INTRAVENOUS
  Filled 2016-08-07: qty 10

## 2016-08-07 MED ORDER — PANTOPRAZOLE SODIUM 40 MG PO TBEC
40.0000 mg | DELAYED_RELEASE_TABLET | Freq: Every day | ORAL | Status: DC
  Administered 2016-08-07 – 2016-08-14 (×8): 40 mg via ORAL
  Filled 2016-08-07 (×9): qty 1

## 2016-08-07 MED ORDER — TEMAZEPAM 7.5 MG PO CAPS: 30.0000 mg | ORAL_CAPSULE | Freq: Every evening | ORAL | Status: DC | PRN

## 2016-08-07 MED ORDER — ROSUVASTATIN CALCIUM 20 MG PO TABS
20.0000 mg | ORAL_TABLET | Freq: Every day | ORAL | Status: DC
  Administered 2016-08-07 – 2016-08-13 (×7): 20 mg via ORAL
  Filled 2016-08-07 (×8): qty 1

## 2016-08-07 MED ORDER — VITAMIN D 1000 UNITS PO TABS
1000.0000 [IU] | ORAL_TABLET | Freq: Every day | ORAL | Status: DC
  Administered 2016-08-07 – 2016-08-14 (×8): 1000 [IU] via ORAL
  Filled 2016-08-07 (×8): qty 1

## 2016-08-07 MED ORDER — SODIUM CHLORIDE 0.9 % IV SOLN
INTRAVENOUS | Status: DC
Start: 2016-08-07 — End: 2016-08-08
  Administered 2016-08-07 – 2016-08-08 (×3): via INTRAVENOUS

## 2016-08-07 MED ORDER — OXYCODONE-ACETAMINOPHEN 5-325 MG PO TABS
1.0000 | ORAL_TABLET | ORAL | Status: DC | PRN
  Administered 2016-08-07 – 2016-08-13 (×12): 1 via ORAL
  Filled 2016-08-07 (×12): qty 1

## 2016-08-07 MED ORDER — ACETAMINOPHEN 325 MG PO TABS: 650.0000 mg | ORAL_TABLET | Freq: Four times a day (QID) | ORAL | Status: DC | PRN

## 2016-08-07 MED ORDER — IPRATROPIUM-ALBUTEROL 0.5-2.5 (3) MG/3ML IN SOLN
3.0000 mL | Freq: Four times a day (QID) | RESPIRATORY_TRACT | Status: DC | PRN
  Administered 2016-08-07 – 2016-08-14 (×12): 3 mL via RESPIRATORY_TRACT
  Filled 2016-08-07 (×11): qty 3

## 2016-08-07 MED ORDER — CEFTRIAXONE SODIUM 1 G IJ SOLR: 1.0000 g | Freq: Once | INTRAMUSCULAR | Status: DC

## 2016-08-07 MED ORDER — ALBUTEROL SULFATE (2.5 MG/3ML) 0.083% IN NEBU
2.5000 mg | INHALATION_SOLUTION | Freq: Once | RESPIRATORY_TRACT | Status: AC
  Administered 2016-08-07: 2.5 mg via RESPIRATORY_TRACT
  Filled 2016-08-07: qty 3

## 2016-08-07 MED ORDER — MORPHINE SULFATE (PF) 4 MG/ML IV SOLN
1.0000 mg | Freq: Once | INTRAVENOUS | Status: AC
  Administered 2016-08-07: 1 mg via INTRAVENOUS
  Filled 2016-08-07: qty 1

## 2016-08-07 MED ORDER — DEXTROSE 5 % IV SOLN
500.0000 mg | INTRAVENOUS | Status: DC
  Administered 2016-08-08: 500 mg via INTRAVENOUS
  Filled 2016-08-07: qty 500

## 2016-08-07 MED ORDER — DEXTROSE 5 % IV SOLN
1.0000 g | Freq: Once | INTRAVENOUS | Status: AC
  Administered 2016-08-07: 1 g via INTRAVENOUS
  Filled 2016-08-07: qty 10

## 2016-08-07 MED ORDER — ONDANSETRON HCL 4 MG PO TABS: 4.0000 mg | ORAL_TABLET | Freq: Four times a day (QID) | ORAL | Status: DC | PRN

## 2016-08-07 MED ORDER — ASPIRIN EC 81 MG PO TBEC
81.0000 mg | DELAYED_RELEASE_TABLET | Freq: Every day | ORAL | Status: DC
  Administered 2016-08-07 – 2016-08-14 (×8): 81 mg via ORAL
  Filled 2016-08-07 (×8): qty 1

## 2016-08-07 MED ORDER — ORAL CARE MOUTH RINSE
15.0000 mL | Freq: Two times a day (BID) | OROMUCOSAL | Status: DC
  Administered 2016-08-08 (×2): 15 mL via OROMUCOSAL

## 2016-08-07 MED ORDER — INSULIN ASPART 100 UNIT/ML ~~LOC~~ SOLN
0.0000 [IU] | Freq: Every day | SUBCUTANEOUS | Status: DC
  Administered 2016-08-07: 3 [IU] via SUBCUTANEOUS
  Filled 2016-08-07: qty 3

## 2016-08-07 MED ORDER — SENNOSIDES-DOCUSATE SODIUM 8.6-50 MG PO TABS
1.0000 | ORAL_TABLET | Freq: Every evening | ORAL | Status: DC | PRN
  Administered 2016-08-09: 1 via ORAL
  Filled 2016-08-07: qty 1

## 2016-08-07 MED ORDER — INSULIN ASPART 100 UNIT/ML ~~LOC~~ SOLN
0.0000 [IU] | Freq: Three times a day (TID) | SUBCUTANEOUS | Status: DC
  Administered 2016-08-07: 5 [IU] via SUBCUTANEOUS
  Administered 2016-08-08: 7 [IU] via SUBCUTANEOUS
  Administered 2016-08-08: 9 [IU] via SUBCUTANEOUS
  Administered 2016-08-08: 3 [IU] via SUBCUTANEOUS
  Filled 2016-08-07: qty 9
  Filled 2016-08-07: qty 3
  Filled 2016-08-07: qty 5
  Filled 2016-08-07: qty 7

## 2016-08-07 MED ORDER — ALBUTEROL SULFATE (2.5 MG/3ML) 0.083% IN NEBU
2.5000 mg | INHALATION_SOLUTION | Freq: Once | RESPIRATORY_TRACT | Status: AC
  Administered 2016-08-06: 23:00:00 via RESPIRATORY_TRACT

## 2016-08-07 MED ORDER — INSULIN GLARGINE 100 UNIT/ML ~~LOC~~ SOLN
8.0000 [IU] | Freq: Every day | SUBCUTANEOUS | Status: DC
  Administered 2016-08-07 – 2016-08-09 (×3): 8 [IU] via SUBCUTANEOUS
  Filled 2016-08-07 (×4): qty 0.08

## 2016-08-07 MED ORDER — HEPARIN SODIUM (PORCINE) 5000 UNIT/ML IJ SOLN
5000.0000 [IU] | Freq: Three times a day (TID) | INTRAMUSCULAR | Status: DC
  Administered 2016-08-07 – 2016-08-14 (×22): 5000 [IU] via SUBCUTANEOUS
  Filled 2016-08-07 (×22): qty 1

## 2016-08-07 MED ORDER — ONDANSETRON HCL 4 MG/2ML IJ SOLN: 4.0000 mg | Freq: Four times a day (QID) | INTRAMUSCULAR | Status: DC | PRN

## 2016-08-07 MED ORDER — GLUCERNA SHAKE PO LIQD
237.0000 mL | Freq: Three times a day (TID) | ORAL | Status: DC
  Administered 2016-08-07 – 2016-08-10 (×8): 237 mL via ORAL

## 2016-08-07 NOTE — Progress Notes (Signed)
Inpatient Diabetes Program Recommendations  AACE/ADA: New Consensus Statement on Inpatient Glycemic Control (2015)  Target Ranges:  Prepandial:   less than 140 mg/dL      Peak postprandial:   less than 180 mg/dL (1-2 hours)      Critically ill patients:  140 - 180 mg/dL  Results for Miguel Huber, Miguel Huber (MRN [id number]) as of 08/07/2016 11:53  Ref. Range 08/06/2016 20:40 08/07/2016 04:39  Glucose Latest Ref Range: 65 - 99 mg/dL 167 (H) 217 (H)    Review of Glycemic Control  Diabetes history: DM2 Outpatient Diabetes medications: NPH 25 units BID, Regular 10 units BID Current orders for Inpatient glycemic control: None  Inpatient Diabetes Program Recommendations: Insulin - Basal: Please consider ordering Lantus 8 units Q24H starting now (based on 79 kg x 0.1 units). Correction (SSI): Please consider ordering CBGs with Novolog correction scale ACHS.  Thanks, Barnie Alderman, RN, MSN, CDE Diabetes Coordinator Inpatient Diabetes Program [phone] (Team Pager from 8am to 5pm)

## 2016-08-07 NOTE — Progress Notes (Signed)
Pharmacy Antibiotic Note  Miguel Huber is a 83 y.o. male admitted on 08/06/2016 with pneumonia.  Pharmacy has been consulted for ceftriaxone and azithromycin dosing.  Plan: Azithromycin 500 mg iv q 24 hours.   Ceftriaxone 1 g iv q 24 hours.   Height: 5\' 8"  (172.7 cm) Weight: 175 lb 12.8 oz (79.7 kg) IBW/kg (Calculated) : 68.4  Temp (24hrs), Avg:99.1 F (37.3 C), Min:98.3 F (36.8 C), Max:99.8 F (37.7 C)   Recent Labs Lab 08/06/16 2040 08/06/16 2217  WBC 21.0*  --   CREATININE 2.48*  --   LATICACIDVEN  --  1.9    Estimated Creatinine Clearance: 20.7 mL/min (A) (by C-G formula based on SCr of 2.48 mg/dL (H)).    Allergies  Allergen Reactions  . Theophyllines Hives    Antimicrobials this admission: 3/14 vancomycin and Zosyn x 1 CTX 3/15 >>  Azithromycin 3/15 >>  Dose adjustments this admission:   Microbiology results: 3/14 BCx: pending 3/14 UCx: pending   Thank you for allowing pharmacy to be a part of this patient's care.  Ulice Dash D 08/07/2016 4:32 AM

## 2016-08-07 NOTE — Plan of Care (Signed)
Problem: Pain Managment: Goal: General experience of comfort will improve Outcome: Progressing Patient has left rib pain with minimal positioning.   Problem: Activity: Goal: Risk for activity intolerance will decrease Outcome: Not Progressing Encourage movement after administering pain medication.

## 2016-08-07 NOTE — H&P (Signed)
[facility] NAME: Miguel Huber    MR#:  [id number]  DATE OF BIRTH:  1928/02/22  DATE OF ADMISSION:  08/06/2016  PRIMARY CARE PHYSICIAN: Viviana Simpler, MD   REQUESTING/REFERRING PHYSICIAN:   CHIEF COMPLAINT:   Chief Complaint  Patient presents with  . Loss of Consciousness    HISTORY OF PRESENT ILLNESS: Miguel Huber  is a 83 y.o. male with a known history of Pulmonary fibrosis, type 2 diabetes mellitus, COPD, cardiomyopathy, erectile dysfunction, GERD, hypertension, hyperlipidemia, chronic kidney disease presented to the emergency room after he passed out. Patient was going to the bathroom when he passed out. Patient lives with his son at home. She uses home oxygen. He ambulates with the help of a walker at home. Patient was evaluated in the emergency room his WBC count was elevated and blood pressure was on the lower side. Code sepsis was called and patient received IV fluid bolus and broad-spectrum IV antibiotics. Chest x-ray showed interstitial lung disease with infiltrates. Patient not on any oral steroids as outpatient. Has some chills but no fever. Has generalized weakness. No history of any head injury. Has shortness of breath on exertion and occasional cough. No abdominal pain nausea vomiting and diarrhea. Hospitalist service was consulted for further care of the patient.  PAST MEDICAL HISTORY:   Past Medical History:  Diagnosis Date  . Allergic rhinitis   . Cardiomyopathy   . COPD (chronic obstructive pulmonary disease) ([place])   . Diabetes mellitus   . DVT (deep venous thrombosis), right 5/13  . Erectile dysfunction   . GERD (gastroesophageal reflux disease)   . Hx of adenomatous colonic polyps   . Hyperlipidemia   . Hypertension   . Osteoarthritis   . Pulmonary fibrosis ([place])   . Renal insufficiency     PAST SURGICAL HISTORY: Past Surgical History:  Procedure Laterality Date  . CARDIOVASCULAR STRESS TEST      Myoview stress negative EF 65-70% 08/04  . CATARACT EXTRACTION     Cataracts/ IOL OU, [place] 2005, [place] ?  2000  . LUNG BIOPSY     Right lung bx. negative 2001  . ORIF CLAVICLE FRACTURE     Left clavicle fracture- child  . ORIF ULNAR FRACTURE     Left readius/ ulna fracture child    SOCIAL HISTORY:  Social History  Substance Use Topics  . Smoking status: Former Research scientist (life sciences)  . Smokeless tobacco: Never Used  . Alcohol use No    FAMILY HISTORY:  Family History  Problem Relation Age of Onset  . Cancer Neg Hx   . Diabetes Neg Hx     DRUG ALLERGIES:  Allergies  Allergen Reactions  . Theophyllines Hives    REVIEW OF SYSTEMS:   CONSTITUTIONAL: No fever, has weakness.  EYES: No blurred or double vision.  EARS, NOSE, AND THROAT: No tinnitus or ear pain.  RESPIRATORY: Has occasional cough, shortness of breath,  No wheezing or hemoptysis.  CARDIOVASCULAR: No chest pain, orthopnea, edema.  GASTROINTESTINAL: No nausea, vomiting, diarrhea or abdominal pain.  GENITOURINARY: No dysuria, hematuria.  ENDOCRINE: No polyuria, nocturia,  HEMATOLOGY: No anemia, easy bruising or bleeding SKIN: No rash or lesion. MUSCULOSKELETAL: No joint pain or arthritis.   NEUROLOGIC: No tingling, numbness, weakness.  Passed out at home PSYCHIATRY: No anxiety or depression.   MEDICATIONS AT HOME:  Prior to Admission medications   Medication Sig Start Date End Date Taking? Authorizing Provider  aspirin EC 81 MG  tablet Take 81 mg by mouth daily.   Yes Historical Provider, MD  cholecalciferol (VITAMIN D) 1000 units tablet Take 1,000 Units by mouth daily.   Yes Historical Provider, MD  furosemide (LASIX) 80 MG tablet TAKE 1 TABLET BY MOUTH EVERY DAY 04/03/16  Yes Venia Carbon, MD  glucose blood (PRODIGY NO CODING BLOOD GLUC) test strip Check blood sugar twice a day and as directed. Dx E11.29. 06/17/16  Yes Venia Carbon, MD  Insulin Syringe-Needle U-100 (B-D INS SYRINGE 0.5CC/30GX1/2") 30G X 1/2"  0.5 ML MISC Use as directed to inject insulin dx:E11.29 04/30/16  Yes Venia Carbon, MD  ipratropium-albuterol (DUONEB) 0.5-2.5 (3) MG/3ML SOLN Take 3 mLs by nebulization every 6 (six) hours as needed (for wheezing/shortness of breath).    Yes Historical Provider, MD  lisinopril (PRINIVIL,ZESTRIL) 10 MG tablet Take 10 mg by mouth daily.   Yes Historical Provider, MD  NOVOLIN N 100 UNIT/ML injection INJECT 0.25 MLS (25 UNITS TOTAL) INTO THE SKIN 2 (TWO) TIMES DAILY. 04/07/16  Yes Venia Carbon, MD  NOVOLIN R 100 UNIT/ML injection INJECT 0.1 MLS (10 UNITS TOTAL) INTO THE SKIN 2 (TWO) TIMES DAILY BEFORE A MEAL. DX: E11.40 06/23/16  Yes Venia Carbon, MD  omeprazole (PRILOSEC) 20 MG capsule Take 1 capsule (20 mg total) by mouth daily. 11/29/15  Yes Venia Carbon, MD  rosuvastatin (CRESTOR) 20 MG tablet Take 20 mg by mouth at bedtime.   Yes Historical Provider, MD  temazepam (RESTORIL) 30 MG capsule Take 30 mg by mouth at bedtime as needed for sleep.   Yes Historical Provider, MD      PHYSICAL EXAMINATION:   VITAL SIGNS: Blood pressure (!) 96/53, pulse 97, temperature 98.3 F (36.8 C), temperature source Oral, resp. rate (!) 27, height 5\' 8"  (1.727 m), weight 84.4 kg (186 lb), SpO2 97 %.  GENERAL:  83 y.o.-year-old patient lying in the bed with no acute distress.  EYES: Pupils equal, round, reactive to light and accommodation. No scleral icterus. Extraocular muscles intact.  HEENT: Head atraumatic, normocephalic. Oropharynx and nasopharynx clear.  NECK:  Supple, no jugular venous distention. No thyroid enlargement, no tenderness.  LUNGS: Decreased breath sounds bilaterally, bibasilar rales heard. No use of accessory muscles of respiration.  CARDIOVASCULAR: S1, S2 normal. No murmurs, rubs, or gallops.  ABDOMEN: Soft, nontender, nondistended. Bowel sounds present. No organomegaly or mass.  EXTREMITIES: No pedal edema, cyanosis, or clubbing.  NEUROLOGIC: Cranial nerves II through XII are  intact. Muscle strength 5/5 in all extremities. Sensation intact. Gait not checked.  PSYCHIATRIC: The patient is alert and oriented x 3.  SKIN: No obvious rash, lesion, or ulcer.   LABORATORY PANEL:   CBC  Recent Labs Lab 08/06/16 2040  WBC 21.0*  HGB 14.0  HCT 41.7  PLT 161  MCV 95.2  MCH 32.0  MCHC 33.6  RDW 13.4   ------------------------------------------------------------------------------------------------------------------  Chemistries   Recent Labs Lab 08/06/16 2040  NA 140  K 4.2  CL 107  CO2 26  GLUCOSE 167*  BUN 61*  CREATININE 2.48*  CALCIUM 8.5*   ------------------------------------------------------------------------------------------------------------------ estimated creatinine clearance is 22.6 mL/min (A) (by C-G formula based on SCr of 2.48 mg/dL (H)). ------------------------------------------------------------------------------------------------------------------ No results for input(s): TSH, T4TOTAL, T3FREE, THYROIDAB in the last 72 hours.  Invalid input(s): FREET3   Coagulation profile No results for input(s): INR, PROTIME in the last 168 hours. ------------------------------------------------------------------------------------------------------------------- No results for input(s): DDIMER in the last 72 hours. -------------------------------------------------------------------------------------------------------------------  Cardiac Enzymes  Recent Labs  Lab 08/06/16 2040  TROPONINI 0.03*   ------------------------------------------------------------------------------------------------------------------ Invalid input(s): POCBNP  ---------------------------------------------------------------------------------------------------------------  Urinalysis    Component Value Date/Time   COLORURINE YELLOW (A) 08/06/2016 2217   APPEARANCEUR CLOUDY (A) 08/06/2016 2217   LABSPEC 1.016 08/06/2016 2217   PHURINE 5.0 08/06/2016 2217    GLUCOSEU NEGATIVE 08/06/2016 2217   HGBUR LARGE (A) 08/06/2016 2217   BILIRUBINUR NEGATIVE 08/06/2016 2217   KETONESUR NEGATIVE 08/06/2016 2217   PROTEINUR 30 (A) 08/06/2016 2217   NITRITE NEGATIVE 08/06/2016 2217   LEUKOCYTESUR NEGATIVE 08/06/2016 2217     RADIOLOGY: Dg Chest 2 View  Result Date: 08/06/2016 CLINICAL DATA:  Worsening shortness of breath over 3 days. EXAM: CHEST  2 VIEW COMPARISON:  CT chest 11/16/2015.  Chest 11/16/2015. FINDINGS: Low lung volumes with diffuse pulmonary fibrosis suggesting usual interstitial pneumonitis. No definite evidence of any superimposed infiltration or edema. No blunting of costophrenic angles. No pneumothorax. Heart size is normal. Calcified and tortuous aorta. Degenerative changes in the spine. IMPRESSION: Diffuse pulmonary fibrosis suggesting usual interstitial pneumonitis with associated low lung volumes. Similar appearance to previous studies. Electronically Signed   By: Lucienne Capers M.D.   On: 08/06/2016 21:17    EKG: Orders placed or performed during the hospital encounter of 08/06/16  . ED EKG  . ED EKG  . EKG 12-Lead  . EKG 12-Lead    IMPRESSION AND PLAN: 83 year old elderly male patient with history of cardiomyopathy, chronic kidney disease, hypertension, hyperlipidemia, pulmonary fibrosis, COPD presented to the emergency room with an episode of passing out. Admitting diagnosis 1. Syncope secondary to vasovagal etiology 2. Hypotension 3. Sepsis 4. Leukocytosis 5. Community-acquired pneumonia 6. Pulmonary fibrosis Treatment plan Admit patient to telemetry inpatient service IV fluid hydration Start patient on IV Rocephin and IV Zithromax antibiotics Follow-up cultures Cycle troponin to rule out ischemia Check echocardiogram DVT prophylaxis subcutaneous heparin Oxygen via nasal cannula Supportive care  All the records are reviewed and case discussed with ED provider. Management plans discussed with the patient, family  and they are in agreement.  CODE STATUS:Full code Surrogate decision maker : son    Code Status Orders        Start     Ordered   08/07/16 0253  Full code  Continuous     08/07/16 0253    Code Status History    Date Active Date Inactive Code Status Order ID Comments User Context   11/17/2015 12:42 AM 11/18/2015  4:36 PM Full Code [id number]  Quintella Baton, MD Inpatient    Advance Directive Documentation     Most Recent Value  Type of Advance Directive  Living will, Healthcare Power of Attorney  Pre-existing out of facility DNR order (yellow form or pink MOST form)  -  "MOST" Form in Place?  -       TOTAL TIME TAKING CARE OF THIS PATIENT: 52 minutes.    Saundra Shelling M.D on 08/07/2016 at 2:54 AM  Between 7am to 6pm - Pager - [phone]  After 6pm go to [email or web address] - password [email or web address] [facility]  Office  [phone]  CC: Primary care physician; Viviana Simpler, MD

## 2016-08-07 NOTE — Progress Notes (Signed)
Initial Nutrition Assessment  DOCUMENTATION CODES:   Not applicable  INTERVENTION:  1. Glucerna Shake po TID, each supplement provides 220 kcal and 10 grams of protein  NUTRITION DIAGNOSIS:   Unintentional weight loss related to chronic illness as evidenced by per patient/family report.  GOAL:   Patient will meet greater than or equal to 90% of their needs  MONITOR:   PO intake, I & O's, Labs, Weight trends, Supplement acceptance  REASON FOR ASSESSMENT:   Consult Assessment of nutrition requirement/status  ASSESSMENT:   Miguel Huber  is a 83 y.o. male with a known history of Pulmonary fibrosis, type 2 diabetes mellitus, COPD, cardiomyopathy, erectile dysfunction, GERD, hypertension, hyperlipidemia, chronic kidney disease presented to the emergency room after he passed out.  Spoke with Miguel Huber at bedside.  SOB today. He had frosted flakes this morning for breakfast. Denies any recent weight loss. Per chart review he exhibits an 11#/6% severe wt loss over 1 week. Is on Lasix at home. Suspect fluid related. Does not know of UBW. PO intake seems to be ok at home - though likely high in sodium intake. Eggs for breakfast Goes to [facility] for Lunch Usually eats [facility] for [employer]. States he has been coughing for 3 days now, causes him some rib pain. Nutrition-Focused physical exam completed. Findings are moderate fat depletion, mild-moderate muscle depletion, and no edema.  Labs and medications reviewed: Vitamin D, Sliding Scale Novolog NS @ 24mL/hr  Diet Order:  Diet heart healthy/carb modified Room service appropriate? Yes; Fluid consistency: Thin  Skin:  Reviewed, no issues  Last BM:  08/07/2016  Height:   Ht Readings from Last 1 Encounters:  08/07/16 5\' 8"  (1.727 m)    Weight:   Wt Readings from Last 1 Encounters:  08/07/16 175 lb 12.8 oz (79.7 kg)    Ideal Body Weight:  70 kg  BMI:  Body mass index is 26.73 kg/m.  Estimated  Nutritional Needs:   Kcal:  1600-2000 calories  Protein:  80-95 gn  Fluid:  >/= 1.6L  EDUCATION NEEDS:   Education needs no appropriate at this time  Miguel Huber. Miguel Hollar, MS, RD LDN Inpatient Clinical Dietitian Pager [phone]

## 2016-08-07 NOTE — Progress Notes (Signed)
[facility] [place] NAME: Miguel Huber    MR#:  [id number]  DATE OF BIRTH:  06-Oct-1927  SUBJECTIVE:  CHIEF COMPLAINT:  Patient's shortness of breath is better reporting some cough  REVIEW OF SYSTEMS:  CONSTITUTIONAL: No fever, fatigue or weakness.  EYES: No blurred or double vision.  EARS, NOSE, AND THROAT: No tinnitus or ear pain.  RESPIRATORY: Some cough, shortness of breath with exertion, denies wheezing or hemoptysis.  CARDIOVASCULAR: No chest pain, orthopnea, edema.  GASTROINTESTINAL: No nausea, vomiting, diarrhea or abdominal pain.  GENITOURINARY: No dysuria, hematuria.  ENDOCRINE: No polyuria, nocturia,  HEMATOLOGY: No anemia, easy bruising or bleeding SKIN: No rash or lesion. MUSCULOSKELETAL: No joint pain or arthritis.   NEUROLOGIC: No tingling, numbness, weakness.  PSYCHIATRY: No anxiety or depression.   DRUG ALLERGIES:   Allergies  Allergen Reactions  . Theophyllines Hives    VITALS:  Blood pressure (!) 118/52, pulse 87, temperature 98.9 F (37.2 C), temperature source Oral, resp. rate 14, height 5\' 8"  (1.727 m), weight 79.7 kg (175 lb 12.8 oz), SpO2 (!) 89 %.  PHYSICAL EXAMINATION:  GENERAL:  83 y.o.-year-old patient lying in the bed with no acute distress.  EYES: Pupils equal, round, reactive to light and accommodation. No scleral icterus. Extraocular muscles intact.  HEENT: Head atraumatic, normocephalic. Oropharynx and nasopharynx clear.  NECK:  Supple, no jugular venous distention. No thyroid enlargement, no tenderness.  LUNGS: COARSE  breath sounds bilaterally, no wheezing, rales,rhonchi , some crepitation. No use of accessory muscles of respiration.  CARDIOVASCULAR: S1, S2 normal. No murmurs, rubs, or gallops.  ABDOMEN: Soft, nontender, nondistended. Bowel sounds present. No organomegaly or mass.  EXTREMITIES: No pedal edema, cyanosis, or clubbing.  NEUROLOGIC: Cranial nerves II through XII are  intact. Muscle strength 5/5 in all extremities. Sensation intact. Gait not checked.  PSYCHIATRIC: The patient is alert and oriented x 3.  SKIN: No obvious rash, lesion, or ulcer.    LABORATORY PANEL:   CBC  Recent Labs Lab 08/07/16 0439  WBC 16.7*  HGB 13.6  HCT 40.8  PLT 142*   ------------------------------------------------------------------------------------------------------------------  Chemistries   Recent Labs Lab 08/07/16 0439  NA 142  K 4.0  CL 110  CO2 24  GLUCOSE 217*  BUN 55*  CREATININE 2.22*  CALCIUM 8.3*   ------------------------------------------------------------------------------------------------------------------  Cardiac Enzymes  Recent Labs Lab 08/07/16 1042  TROPONINI 0.03*   ------------------------------------------------------------------------------------------------------------------  RADIOLOGY:  Dg Chest 2 View  Result Date: 08/06/2016 CLINICAL DATA:  Worsening shortness of breath over 3 days. EXAM: CHEST  2 VIEW COMPARISON:  CT chest 11/16/2015.  Chest 11/16/2015. FINDINGS: Low lung volumes with diffuse pulmonary fibrosis suggesting usual interstitial pneumonitis. No definite evidence of any superimposed infiltration or edema. No blunting of costophrenic angles. No pneumothorax. Heart size is normal. Calcified and tortuous aorta. Degenerative changes in the spine. IMPRESSION: Diffuse pulmonary fibrosis suggesting usual interstitial pneumonitis with associated low lung volumes. Similar appearance to previous studies. Electronically Signed   By: Lucienne Capers M.D.   On: 08/06/2016 21:17   Dg Chest Port 1 View  Result Date: 08/07/2016 CLINICAL DATA:  Pneumonia.  Sepsis. EXAM: PORTABLE CHEST 1 VIEW COMPARISON:  08/06/2016 FINDINGS: Low lung volumes. Diffuse pulmonary interstitial fibrosis. No significant change since previous study. No blunting of costophrenic angles. No pneumothorax. Normal heart size and pulmonary vascularity.  Calcified and tortuous aorta. IMPRESSION: Diffuse pulmonary fibrosis.  No developing consolidation. Electronically Signed   By: Oren Beckmann.D.  On: 08/07/2016 05:14    EKG:   Orders placed or performed during the hospital encounter of 08/06/16  . ED EKG  . ED EKG  . EKG 12-Lead  . EKG 12-Lead    ASSESSMENT AND PLAN:   83 year old elderly male patient with history of cardiomyopathy, chronic kidney disease, hypertension, hyperlipidemia, pulmonary fibrosis, COPD presented to the emergency room with an episode of passing out. 1. Syncope secondary to Hypotension/vasovagal IV fluids PT consult Acute MI ruled out with negative troponins. Lactic acid is normal  2. AKI  On ckd Hypotension-provide IV fluids Baseline creatinine seems to be at 1.87 at baseline Creatinine 2.48--2.2 . Avoid nephrotoxins check a.m. labs  3. Sepsis-could be from acute bronchitis probably viral etiology Blood cultures negative so far Urine culture sensitivity pending IV Rocephin and azithromycin Leukocytosis is trending down Chest x-ray negative for pneumonia  4. . Pulmonary fibrosis Outpatient follow-up with pulmonology     All the records are reviewed and case discussed with Care Management/Social Workerr. Management plans discussed with the patient, family and they are in agreement.  CODE STATUS: FC   TOTAL TIME TAKING CARE OF THIS PATIENT: 35  minutes.   POSSIBLE D/C IN 1-2  DAYS, DEPENDING ON CLINICAL CONDITION.  Note: This dictation was prepared with Dragon dictation along with smaller phrase technology. Any transcriptional errors that result from this process are unintentional.   Nicholes Mango M.D on 08/07/2016 at 2:56 PM  Between 7am to 6pm - Pager - [phone] After 6pm go to [email or web address] - password [email or web address] [place] [facility]  Office  [phone]  CC: Primary care physician; Viviana Simpler, MD

## 2016-08-07 NOTE — Progress Notes (Signed)
Notified Dr. Tressia Miners about labored breathing , patient is tachypniec and SOB. Received a one time order for 1mg  of morphine.

## 2016-08-08 ENCOUNTER — Inpatient Hospital Stay: Payer: Medicare Other

## 2016-08-08 DIAGNOSIS — J9601 Acute respiratory failure with hypoxia: Secondary | ICD-10-CM

## 2016-08-08 LAB — BASIC METABOLIC PANEL
ANION GAP: 9 (ref 5–15)
BUN: 44 mg/dL — ABNORMAL HIGH (ref 6–20)
CO2: 24 mmol/L (ref 22–32)
Calcium: 8.4 mg/dL — ABNORMAL LOW (ref 8.9–10.3)
Chloride: 112 mmol/L — ABNORMAL HIGH (ref 101–111)
Creatinine, Ser: 1.96 mg/dL — ABNORMAL HIGH (ref 0.61–1.24)
GFR calc Af Amer: 34 mL/min — ABNORMAL LOW (ref >60)
GFR, EST NON AFRICAN AMERICAN: 29 mL/min — AB (ref >60)
GLUCOSE: 231 mg/dL — AB (ref 65–99)
POTASSIUM: 4.4 mmol/L (ref 3.5–5.1)
Sodium: 145 mmol/L (ref 135–145)

## 2016-08-08 LAB — CBC
HCT: 42.8 % (ref 40.0–52.0)
Hemoglobin: 14.5 g/dL (ref 13.0–18.0)
MCH: 32.9 pg (ref 26.0–34.0)
MCHC: 33.8 g/dL (ref 32.0–36.0)
MCV: 97.2 fL (ref 80.0–100.0)
PLATELETS: 140 10*3/uL — AB (ref 150–440)
RBC: 4.4 MIL/uL (ref 4.40–5.90)
RDW: 13.7 % (ref 11.5–14.5)
WBC: 13.6 10*3/uL — AB (ref 3.8–10.6)

## 2016-08-08 LAB — GLUCOSE, CAPILLARY
GLUCOSE-CAPILLARY: 209 mg/dL — AB (ref 65–99)
GLUCOSE-CAPILLARY: 318 mg/dL — AB (ref 65–99)
GLUCOSE-CAPILLARY: 328 mg/dL — AB (ref 65–99)
GLUCOSE-CAPILLARY: 368 mg/dL — AB (ref 65–99)
Glucose-Capillary: 356 mg/dL — ABNORMAL HIGH (ref 65–99)

## 2016-08-08 LAB — ECHOCARDIOGRAM COMPLETE
FS: 31 % (ref 28–44)
Height: 68 in
IVS/LV PW RATIO, ED: 1.25
LA ID, A-P, ES: 44 mm
LA diam end sys: 44 mm
LADIAMINDEX: 2.23 cm/m2
LV PW d: 10.2 mm — AB (ref 0.6–1.1)
Lateral S' vel: 14.1 cm/s
RV TAPSE: 29.5 mm
WEIGHTICAEL: 2812.8 [oz_av]

## 2016-08-08 LAB — URINE CULTURE: Culture: NO GROWTH

## 2016-08-08 LAB — PROCALCITONIN: PROCALCITONIN: 1.09 ng/mL

## 2016-08-08 LAB — MRSA PCR SCREENING: MRSA by PCR: NEGATIVE

## 2016-08-08 MED ORDER — ORAL CARE MOUTH RINSE
15.0000 mL | Freq: Two times a day (BID) | OROMUCOSAL | Status: DC
  Administered 2016-08-08 – 2016-08-17 (×17): 15 mL via OROMUCOSAL

## 2016-08-08 MED ORDER — SODIUM CHLORIDE 0.9 % IV SOLN
1250.0000 mg | Freq: Once | INTRAVENOUS | Status: AC
  Administered 2016-08-08: 1250 mg via INTRAVENOUS
  Filled 2016-08-08: qty 1250

## 2016-08-08 MED ORDER — METHYLPREDNISOLONE SODIUM SUCC 125 MG IJ SOLR
60.0000 mg | Freq: Four times a day (QID) | INTRAMUSCULAR | Status: DC
  Administered 2016-08-08 – 2016-08-09 (×5): 60 mg via INTRAVENOUS
  Filled 2016-08-08 (×5): qty 2

## 2016-08-08 MED ORDER — CHLORHEXIDINE GLUCONATE 0.12 % MT SOLN
15.0000 mL | Freq: Two times a day (BID) | OROMUCOSAL | Status: DC
  Administered 2016-08-08 – 2016-08-14 (×12): 15 mL via OROMUCOSAL
  Filled 2016-08-08 (×9): qty 15

## 2016-08-08 MED ORDER — VANCOMYCIN HCL 10 G IV SOLR
1250.0000 mg | INTRAVENOUS | Status: DC
  Administered 2016-08-09: 1250 mg via INTRAVENOUS
  Filled 2016-08-08: qty 1250

## 2016-08-08 MED ORDER — PIPERACILLIN-TAZOBACTAM 3.375 G IVPB
3.3750 g | Freq: Three times a day (TID) | INTRAVENOUS | Status: DC
Start: 2016-08-08 — End: 2016-08-14
  Administered 2016-08-08 – 2016-08-14 (×18): 3.375 g via INTRAVENOUS
  Filled 2016-08-08 (×21): qty 50

## 2016-08-08 MED ORDER — INSULIN ASPART 100 UNIT/ML ~~LOC~~ SOLN
0.0000 [IU] | SUBCUTANEOUS | Status: DC
  Administered 2016-08-08: 5 [IU] via SUBCUTANEOUS
  Administered 2016-08-09: 3 [IU] via SUBCUTANEOUS
  Administered 2016-08-09: 4 [IU] via SUBCUTANEOUS
  Administered 2016-08-09 (×2): 5 [IU] via SUBCUTANEOUS
  Filled 2016-08-08: qty 3
  Filled 2016-08-08 (×2): qty 4
  Filled 2016-08-08 (×2): qty 5

## 2016-08-08 NOTE — Progress Notes (Signed)
Patient now on HI flow 02 with sat 93% , will continue to monitor

## 2016-08-08 NOTE — Progress Notes (Signed)
Patient is transfer to icu 8 due to increase difficulty breathing , and the need of high oxygen  with the use of accessory  Muscles , MD was made aware of pt being transfer

## 2016-08-08 NOTE — Consult Note (Addendum)
[name] Consultation     ASSESSMENT/PLAN   Respiratory failure ; multifactorial. Chest x-ray That I personally reviewed, reveals chronic interstitial changes with superimpose infiltrate most likely pulmonary fibrosis with superimpose exacerbation and/or infection. Per history has also underlying chronic objective pulmonary disease. We'll moved to the intensive care unit, placed on heated high flow, agree with vancomycin and Zosyn, will add Solu-Medrol to his regimen, respiratory culture data as possible. If respiratory status deteriorates may require intubation as patient is a full code  Leukocytosis: Is on empiric broad-spectrum antibiotics. For calcitonin however is 1.09  Renal insufficiency: Renal insufficiency noted at 44/1.96  Hyperglycemia: Per hospitalist service  History of cardiomyopathy: Last measured ejection fraction was 60%, patient noted to have pulmonary hypertension with right ventricular pressures of 65    Name: Miguel Huber MRN: [id number] DOB: January 08, 1928    ADMISSION DATE:  08/06/2016 CONSULTATION DATE:  08/08/16  REFERRING MD : Dr. Margaretmary Eddy  CHIEF COMPLAINT:  Shortness of Breath   HISTORY OF PRESENT ILLNESS:  [name] is a very pleasant 83 year old gentleman with a past medical history remarkable for pulmonary fibrosis, COPD, hypertension, hyperlipidemia, diabetes mellitus, gastroesophageal reflux disease, erectile dysfunction, cardiomyopathy, renal insufficiency on home oxygen therapy, lives with his son and  presented to the emergency room after passing out at home. In emergency department he was hypoxemic and hypotensive, he responded to fluid resuscitation was given IV antibiotics. We will called this morning to evaluate patient secondary to progressive increasing respiratory distress now on he did high flow oxygen at 65%.  PAST MEDICAL HISTORY :  Past Medical History:  Diagnosis Date  . Allergic rhinitis   . Cardiomyopathy     . COPD (chronic obstructive pulmonary disease) ([place])   . Diabetes mellitus   . DVT (deep venous thrombosis), right 5/13  . Erectile dysfunction   . GERD (gastroesophageal reflux disease)   . Hx of adenomatous colonic polyps   . Hyperlipidemia   . Hypertension   . Osteoarthritis   . Pulmonary fibrosis ([place])   . Renal insufficiency    Past Surgical History:  Procedure Laterality Date  . CARDIOVASCULAR STRESS TEST     Myoview stress negative EF 65-70% 08/04  . CATARACT EXTRACTION     Cataracts/ IOL OU, [place] 2005, [place] ?  2000  . LUNG BIOPSY     Right lung bx. negative 2001  . ORIF CLAVICLE FRACTURE     Left clavicle fracture- child  . ORIF ULNAR FRACTURE     Left readius/ ulna fracture child   Prior to Admission medications   Medication Sig Start Date End Date Taking? Authorizing Provider  aspirin EC 81 MG tablet Take 81 mg by mouth daily.   Yes Historical Provider, MD  cholecalciferol (VITAMIN D) 1000 units tablet Take 1,000 Units by mouth daily.   Yes Historical Provider, MD  furosemide (LASIX) 80 MG tablet TAKE 1 TABLET BY MOUTH EVERY DAY 04/03/16  Yes Venia Carbon, MD  glucose blood (PRODIGY NO CODING BLOOD GLUC) test strip Check blood sugar twice a day and as directed. Dx E11.29. 06/17/16  Yes Venia Carbon, MD  Insulin Syringe-Needle U-100 (B-D INS SYRINGE 0.5CC/30GX1/2") 30G X 1/2" 0.5 ML MISC Use as directed to inject insulin dx:E11.29 04/30/16  Yes Venia Carbon, MD  ipratropium-albuterol (DUONEB) 0.5-2.5 (3) MG/3ML SOLN Take 3 mLs by nebulization every 6 (six) hours as needed (for wheezing/shortness of breath).    Yes Historical Provider, MD  lisinopril (PRINIVIL,ZESTRIL) 10  MG tablet Take 10 mg by mouth daily.   Yes Historical Provider, MD  NOVOLIN N 100 UNIT/ML injection INJECT 0.25 MLS (25 UNITS TOTAL) INTO THE SKIN 2 (TWO) TIMES DAILY. 04/07/16  Yes Venia Carbon, MD  NOVOLIN R 100 UNIT/ML injection INJECT 0.1 MLS (10 UNITS TOTAL) INTO THE SKIN 2 (TWO)  TIMES DAILY BEFORE A MEAL. DX: E11.40 06/23/16  Yes Venia Carbon, MD  omeprazole (PRILOSEC) 20 MG capsule Take 1 capsule (20 mg total) by mouth daily. 11/29/15  Yes Venia Carbon, MD  rosuvastatin (CRESTOR) 20 MG tablet Take 20 mg by mouth at bedtime.   Yes Historical Provider, MD  temazepam (RESTORIL) 30 MG capsule Take 30 mg by mouth at bedtime as needed for sleep.   Yes Historical Provider, MD   Allergies  Allergen Reactions  . Theophyllines Hives    FAMILY HISTORY:  Family History  Problem Relation Age of Onset  . Cancer Neg Hx   . Diabetes Neg Hx    SOCIAL HISTORY:  reports that he has quit smoking. He has never used smokeless tobacco. He reports that he does not drink alcohol or use drugs.  REVIEW OF SYSTEMS:   Constitutional: Feels well. Cardiovascular: No chest pain.  Pulmonary: Denies dyspnea.   All other review of systems were reviewed and were found to be negative other than what is documented in the HPI.    VITAL SIGNS: Temp:  [98.2 F (36.8 C)-99.9 F (37.7 C)] 99.9 F (37.7 C) (03/16 0815) Pulse Rate:  [87-116] 115 (03/16 0815) Resp:  [14-44] 20 (03/16 0815) BP: (109-149)/(52-73) 109/65 (03/16 0815) SpO2:  [89 %-98 %] 91 % (03/16 0921) FiO2 (%):  [40 %-65 %] 65 % (03/16 0921) HEMODYNAMICS:   VENTILATOR SETTINGS: FiO2 (%):  [40 %-65 %] 65 % INTAKE / OUTPUT:  Intake/Output Summary (Last 24 hours) at 08/08/16 1108 Last data filed at 08/08/16 0940  Gross per 24 hour  Intake             2480 ml  Output              725 ml  Net             1755 ml    Physical Examination:   VS: BP 109/65 (BP Location: Right Arm)   Pulse (!) 115   Temp 99.9 F (37.7 C) (Oral)   Resp 20   Ht 5\' 8"  (1.727 m)   Wt 79.7 kg (175 lb 12.8 oz)   SpO2 91%   BMI 26.73 kg/m   General Appearance: Patient is breathing 25 times per minute, appears somewhat labored in respiratory distress on he did high flow oxygen. He is communicating but breathing twice percent since. He is  using some accessory muscle utilization. Neuro:without focal findings, mental status, speech is labored HEENT: PERRLA, EOM intact  Pulmonary: Diffuse crackles appreciated, inspiratory, posterior with central airway secretions noted and congestion Cardiovascular S1,S2.  Tachycardic at 110 Abdomen: Benign, Soft, non-tender, No masses, hepatosplenomegaly, No lymphadenopathy Renal:  No costovertebral tenderness  GU:  Not performed at this time. Endoc: No evident thyromegaly, no signs of acromegaly. Skin:   warm, no rashes, no ecchymosis  Musculoskeletal: normal, no cyanosis, clubbing, no edema, warm with normal capillary refill.    LABS: Reviewed   LABORATORY PANEL:   CBC  Recent Labs Lab 08/08/16 0417  WBC 13.6*  HGB 14.5  HCT 42.8  PLT 140*    Chemistries   Recent Labs Lab 08/08/16  0417  NA 145  K 4.4  CL 112*  CO2 24  GLUCOSE 231*  BUN 44*  CREATININE 1.96*  CALCIUM 8.4*     Recent Labs Lab 08/07/16 1726 08/07/16 2038 08/08/16 0750  GLUCAP 279* 321* 209*   No results for input(s): PHART, PCO2ART, PO2ART in the last 168 hours. No results for input(s): AST, ALT, ALKPHOS, BILITOT, ALBUMIN in the last 168 hours.  Cardiac Enzymes  Recent Labs Lab 08/07/16 1609  TROPONINI 0.03*    RADIOLOGY:  Dg Chest 2 View  Result Date: 08/06/2016 CLINICAL DATA:  Worsening shortness of breath over 3 days. EXAM: CHEST  2 VIEW COMPARISON:  CT chest 11/16/2015.  Chest 11/16/2015. FINDINGS: Low lung volumes with diffuse pulmonary fibrosis suggesting usual interstitial pneumonitis. No definite evidence of any superimposed infiltration or edema. No blunting of costophrenic angles. No pneumothorax. Heart size is normal. Calcified and tortuous aorta. Degenerative changes in the spine. IMPRESSION: Diffuse pulmonary fibrosis suggesting usual interstitial pneumonitis with associated low lung volumes. Similar appearance to previous studies. Electronically Signed   By: Lucienne Capers M.D.   On: 08/06/2016 21:17   Dg Chest Port 1 View  Result Date: 08/08/2016 CLINICAL DATA:  Acute onset shortness of breath.  Cough, congestion EXAM: PORTABLE CHEST 1 VIEW COMPARISON:  08/07/2016 FINDINGS: Diffuse chronic interstitial prominence throughout the lungs compatible with severe chronic interstitial lung disease/ fibrosis. More confluent opacities noted in the inferior right upper lobe and right lung base. Cannot exclude superimposed pneumonia. Heart is upper limits normal in size. No definite visible effusion. IMPRESSION: Severe chronic lung disease/ fibrosis. Focal airspace opacities in the right mid and lower lung. Findings concerning for superimposed pneumonia. Electronically Signed   By: Rolm Baptise M.D.   On: 08/08/2016 09:30   Dg Chest Port 1 View  Result Date: 08/07/2016 CLINICAL DATA:  Pneumonia.  Sepsis. EXAM: PORTABLE CHEST 1 VIEW COMPARISON:  08/06/2016 FINDINGS: Low lung volumes. Diffuse pulmonary interstitial fibrosis. No significant change since previous study. No blunting of costophrenic angles. No pneumothorax. Normal heart size and pulmonary vascularity. Calcified and tortuous aorta. IMPRESSION: Diffuse pulmonary fibrosis.  No developing consolidation. Electronically Signed   By: Lucienne Capers M.D.   On: 08/07/2016 05:14       Hermelinda Dellen, DO ICU Pager [phone] [facility] Office Number: [phone]   08/08/2016, 11:08 AM

## 2016-08-08 NOTE — Progress Notes (Signed)
Pharmacy Antibiotic Note  Miguel Huber is a 83 y.o. male admitted on 08/06/2016 with pneumonia.  Pharmacy has been consulted for Vancomycin and Zosyn dosing. Patient initially started on Azithromycin and Ceftriaxone but is more SOB today and procalcitonin is 1.09.  Plan: Will order Vancomycin 1250mg  IV q36h, stacked dosing. Will check a trough level prior to 4th dose. MRSA PCR is ordered, if negative may be able to discontinue Vancomycin. Zosyn 3.375g IV q8h EI.  Height: 5\' 8"  (172.7 cm) Weight: 175 lb 12.8 oz (79.7 kg) IBW/kg (Calculated) : 68.4  Temp (24hrs), Avg:98.9 F (37.2 C), Min:98.2 F (36.8 C), Max:99.9 F (37.7 C)   Recent Labs Lab 08/06/16 2040 08/06/16 2217 08/07/16 0439 08/08/16 0417  WBC 21.0*  --  16.7* 13.6*  CREATININE 2.48*  --  2.22* 1.96*  LATICACIDVEN  --  1.9  --   --     Estimated Creatinine Clearance: 26.2 mL/min (A) (by C-G formula based on SCr of 1.96 mg/dL (H)).    Allergies  Allergen Reactions  . Theophyllines Hives    Antimicrobials this admission: Ceftriaxone 3/15 >> 3/16 Azithromycin 3/15 >> 3/16 Vancomycin 3/16 >> Zosyn 3/16 >>  Microbiology results: 3/15 BCx: NGTD 3/15 UCx: NG   Thank you for allowing pharmacy to be a part of this patient's care.  Paulina Fusi, PharmD, BCPS 08/08/2016 2:44 PM

## 2016-08-08 NOTE — Progress Notes (Signed)
[facility] [place] NAME: Miguel Huber    MR#:  [id number]  DATE OF BIRTH:  18-Apr-1928  SUBJECTIVE:  CHIEF COMPLAINT:  Patient's shortness of breath is Worse today and patient was placed on Ventimask and given breathing treatments. He lives on 2 L of oxygen at home for chronic pulmonary fibrosis  REVIEW OF SYSTEMS:  CONSTITUTIONAL: No fever, fatigue or weakness.  EYES: No blurred or double vision.  EARS, NOSE, AND THROAT: No tinnitus or ear pain.  RESPIRATORY: Positive cough,Worsening of shortness of breath , denies wheezing or hemoptysis.  CARDIOVASCULAR: No chest pain, orthopnea, edema.  GASTROINTESTINAL: No nausea, vomiting, diarrhea or abdominal pain.  GENITOURINARY: No dysuria, hematuria.  ENDOCRINE: No polyuria, nocturia,  HEMATOLOGY: No anemia, easy bruising or bleeding SKIN: No rash or lesion. MUSCULOSKELETAL: No joint pain or arthritis.   NEUROLOGIC: No tingling, numbness, weakness.  PSYCHIATRY: No anxiety or depression.   DRUG ALLERGIES:   Allergies  Allergen Reactions  . Theophyllines Hives    VITALS:  Blood pressure 109/65, pulse (!) 115, temperature 99.9 F (37.7 C), temperature source Oral, resp. rate 20, height 5\' 8"  (1.727 m), weight 79.7 kg (175 lb 12.8 oz), SpO2 91 %.  PHYSICAL EXAMINATION:  GENERAL:  83 y.o.-year-old patient lying in the bed with no acute distress.  EYES: Pupils equal, round, reactive to light and accommodation. No scleral icterus. Extraocular muscles intact.  HEENT: Head atraumatic, normocephalic. Oropharynx and nasopharynx clear.  NECK:  Supple, no jugular venous distention. No thyroid enlargement, no tenderness.  LUNGS: COARSE  breath sounds bilaterally, Diffuse crackles no wheezing, rales,rhonchi , some crepitation. No use of accessory muscles of respiration.  CARDIOVASCULAR: S1, S2 normal. No murmurs, rubs, or gallops.  ABDOMEN: Soft, nontender, nondistended. Bowel sounds present.  No organomegaly or mass.  EXTREMITIES: No pedal edema, cyanosis, or clubbing.  NEUROLOGIC: Cranial nerves II through XII are intact. Muscle strength 5/5 in all extremities. Sensation intact. Gait not checked.  PSYCHIATRIC: The patient is alert and oriented x 3.  SKIN: No obvious rash, lesion, or ulcer.    LABORATORY PANEL:   CBC  Recent Labs Lab 08/08/16 0417  WBC 13.6*  HGB 14.5  HCT 42.8  PLT 140*   ------------------------------------------------------------------------------------------------------------------  Chemistries   Recent Labs Lab 08/08/16 0417  NA 145  K 4.4  CL 112*  CO2 24  GLUCOSE 231*  BUN 44*  CREATININE 1.96*  CALCIUM 8.4*   ------------------------------------------------------------------------------------------------------------------  Cardiac Enzymes  Recent Labs Lab 08/07/16 1609  TROPONINI 0.03*   ------------------------------------------------------------------------------------------------------------------  RADIOLOGY:  Dg Chest 2 View  Result Date: 08/06/2016 CLINICAL DATA:  Worsening shortness of breath over 3 days. EXAM: CHEST  2 VIEW COMPARISON:  CT chest 11/16/2015.  Chest 11/16/2015. FINDINGS: Low lung volumes with diffuse pulmonary fibrosis suggesting usual interstitial pneumonitis. No definite evidence of any superimposed infiltration or edema. No blunting of costophrenic angles. No pneumothorax. Heart size is normal. Calcified and tortuous aorta. Degenerative changes in the spine. IMPRESSION: Diffuse pulmonary fibrosis suggesting usual interstitial pneumonitis with associated low lung volumes. Similar appearance to previous studies. Electronically Signed   By: Lucienne Capers M.D.   On: 08/06/2016 21:17   Dg Chest Port 1 View  Result Date: 08/08/2016 CLINICAL DATA:  Acute onset shortness of breath.  Cough, congestion EXAM: PORTABLE CHEST 1 VIEW COMPARISON:  08/07/2016 FINDINGS: Diffuse chronic interstitial prominence  throughout the lungs compatible with severe chronic interstitial lung disease/ fibrosis. More confluent opacities noted in the inferior  right upper lobe and right lung base. Cannot exclude superimposed pneumonia. Heart is upper limits normal in size. No definite visible effusion. IMPRESSION: Severe chronic lung disease/ fibrosis. Focal airspace opacities in the right mid and lower lung. Findings concerning for superimposed pneumonia. Electronically Signed   By: Rolm Baptise M.D.   On: 08/08/2016 09:30   Dg Chest Port 1 View  Result Date: 08/07/2016 CLINICAL DATA:  Pneumonia.  Sepsis. EXAM: PORTABLE CHEST 1 VIEW COMPARISON:  08/06/2016 FINDINGS: Low lung volumes. Diffuse pulmonary interstitial fibrosis. No significant change since previous study. No blunting of costophrenic angles. No pneumothorax. Normal heart size and pulmonary vascularity. Calcified and tortuous aorta. IMPRESSION: Diffuse pulmonary fibrosis.  No developing consolidation. Electronically Signed   By: Lucienne Capers M.D.   On: 08/07/2016 05:14    EKG:   Orders placed or performed during the hospital encounter of 08/06/16  . ED EKG  . ED EKG  . EKG 12-Lead  . EKG 12-Lead    ASSESSMENT AND PLAN:   83 year old elderly male patient with history of cardiomyopathy, chronic kidney disease, hypertension, hyperlipidemia, pulmonary fibrosis, COPD presented to the emergency room with an episode of passing out.  #Acute hypoxic respiratory failure on chronic respiratory failure; secondary to acute bronchitis with superimposed healthcare associated pneumonia and chronic pulmonary fibrosis On Ventimask Nebulizer treatments Change antibiotics to broad-spectrum IV Zosyn and vancomycin Pulmonary consult is placed and discussed with PA  #.Sepsis- from acute bronchitis with superimposed healthcare associated pneumonia  Patient's antibiotics were changed to IV Zosyn and vancomycin today  Will get sputum culture and sensitivity  Blood  cultures negative for 2 days Urine culture with no growth Repeat chest x-ray today with superimposed pneumonia on the right side Leukocytosis is trending down  #. Syncope secondary to Hypotension/vasovagal IV fluids PT consult pending Acute MI ruled out with negative troponins. Lactic acid is normal  #. AKI  On ckd Hypotension-provided IV fluids Baseline creatinine seems to be at 1.87 at baseline Creatinine 2.48--2.2 --1.96 very close to his baseline. Avoid nephrotoxins check a.m. labs  # .  chronic Pulmonary fibrosis  follow-up with pulmonology     All the records are reviewed and case discussed with Care Management/Social Workerr. Management plans discussed with the patient, he is in agreement.  CODE STATUS: FC , daughter Ileene Patrick is the healthcare power of attorney message left to call back for an update  TOTAL CRITICAL CARE TIME TAKING CARE OF THIS PATIENT: 38  minutes.   POSSIBLE D/C IN 1-2  DAYS, DEPENDING ON CLINICAL CONDITION.  Note: This dictation was prepared with Dragon dictation along with smaller phrase technology. Any transcriptional errors that result from this process are unintentional.   Nicholes Mango M.D on 08/08/2016 at 10:04 AM  Between 7am to 6pm - Pager - [phone] After 6pm go to [email or web address] - password [email or web address] [facility]  Office  [phone]  CC: Primary care physician; Viviana Simpler, MD

## 2016-08-08 NOTE — Progress Notes (Signed)
Spoke with pt who is alert and oriented regarding code status he stated he WOULD NOT want to be placed on mechanical ventilation.  In the event he were to cardiac arrest he would want CPR, ACLS medications, pressors, Bipap, cardioversion and defibrillation.  Marda Stalker, [place] Pager [phone] (please enter 7 digits) PCCM Consult Pager [phone] (please enter 7 digits)

## 2016-08-08 NOTE — Progress Notes (Signed)
Hinton Dyer came by to look at patient. Orders to call respitory for bipap.

## 2016-08-08 NOTE — Progress Notes (Signed)
PT Cancellation Note  Patient Details Name: Miguel Huber MRN: [id number] DOB: 1927/12/12   Cancelled Treatment:    Reason Eval/Treat Not Completed: Medical issues which prohibited therapy (Per chart review, patient noted with transfer to CCU due to decline in respiratory status.  Per policy, will require new orders to initiate PT.  Please re-consult as medically appropriate.)  Kolbey Teichert H. Owens Shark, PT, DPT, NCS 08/08/16, 11:41 PM [phone]

## 2016-08-08 NOTE — Progress Notes (Signed)
Patient was place on venturi mask 55% breathinglittle better at this time , sat still 89%, DR Gouru at bedside

## 2016-08-08 NOTE — Progress Notes (Signed)
Inpatient Diabetes Program Recommendations  AACE/ADA: New Consensus Statement on Inpatient Glycemic Control (2015)  Target Ranges:  Prepandial:   less than 140 mg/dL      Peak postprandial:   less than 180 mg/dL (1-2 hours)      Critically ill patients:  140 - 180 mg/dL  Results for [name], [name] (MRN [id number]) as of 08/08/2016 10:06  Ref. Range 08/07/2016 17:26 08/07/2016 20:38 08/08/2016 07:50  Glucose-Capillary Latest Ref Range: 65 - 99 mg/dL 279 (H) 321 (H) 209 (H)    Review of Glycemic Control  Diabetes history: DM2 Outpatient Diabetes medications: NPH 25 units BID, Regular 10 units BID Current orders for Inpatient glycemic control: Lantus 8 units daily, Novolog 0-9 units TID with meals, Novolog 0-5 units QHS  Inpatient Diabetes Program Recommendations: Insulin - Basal: Please consider increasing Lantus to 12 units daily. If increased as recommended, please consider ordering one time dose of Lantus 4 units x1 now (for total 12 units today since patient has already received 8 units this morning). Insulin-Meal Coverage: Please consider ordering Novolog 4 units TID with meals for meal coverage if patient eats at least 50% of meal.  Thanks, Barnie Alderman, RN, MSN, CDE Diabetes Coordinator Inpatient Diabetes Program [phone] (Team Pager from 8am to 5pm)

## 2016-08-08 NOTE — Progress Notes (Signed)
PT Cancellation Note  Patient Details Name: Miguel Huber MRN: [id number] DOB: [birth year]   Cancelled Treatment:    Reason Eval/Treat Not Completed: Patient not medically ready.  Per notes, pt's SOB worse today and nursing reports pt now transitioned to High flow this morning (d/t O2 needs/difficulty breathing).  Per discussion with nursing, will hold PT today and re-attempt PT eval at a later date/time as medically appropriate.  Leitha Bleak, PT 08/08/16, 10:47 AM [phone]

## 2016-08-08 NOTE — Progress Notes (Signed)
DR Gouru was made aware of pt having difficulty breathing with sat 90% on 6 lit. And pt very restless . c/o pain and was medicated as order and RT was call to given pt breathing tx , will continue to monitor

## 2016-08-08 NOTE — Progress Notes (Signed)
Pt placed on Bipap due to increased WOB & HR

## 2016-08-09 LAB — CBC
HCT: 38.3 % — ABNORMAL LOW (ref 40.0–52.0)
HEMOGLOBIN: 13 g/dL (ref 13.0–18.0)
MCH: 32.5 pg (ref 26.0–34.0)
MCHC: 33.9 g/dL (ref 32.0–36.0)
MCV: 95.9 fL (ref 80.0–100.0)
PLATELETS: 150 10*3/uL (ref 150–440)
RBC: 3.99 MIL/uL — AB (ref 4.40–5.90)
RDW: 13.6 % (ref 11.5–14.5)
WBC: 11.6 10*3/uL — ABNORMAL HIGH (ref 3.8–10.6)

## 2016-08-09 LAB — BASIC METABOLIC PANEL
Anion gap: 7 (ref 5–15)
BUN: 50 mg/dL — AB (ref 6–20)
CHLORIDE: 115 mmol/L — AB (ref 101–111)
CO2: 22 mmol/L (ref 22–32)
CREATININE: 1.79 mg/dL — AB (ref 0.61–1.24)
Calcium: 8.4 mg/dL — ABNORMAL LOW (ref 8.9–10.3)
GFR, EST AFRICAN AMERICAN: 38 mL/min — AB (ref >60)
GFR, EST NON AFRICAN AMERICAN: 33 mL/min — AB (ref >60)
Glucose, Bld: 316 mg/dL — ABNORMAL HIGH (ref 65–99)
Potassium: 4.3 mmol/L (ref 3.5–5.1)
SODIUM: 144 mmol/L (ref 135–145)

## 2016-08-09 LAB — GLUCOSE, CAPILLARY
GLUCOSE-CAPILLARY: 226 mg/dL — AB (ref 65–99)
GLUCOSE-CAPILLARY: 358 mg/dL — AB (ref 65–99)
GLUCOSE-CAPILLARY: 385 mg/dL — AB (ref 65–99)
Glucose-Capillary: 317 mg/dL — ABNORMAL HIGH (ref 65–99)
Glucose-Capillary: 275 mg/dL — ABNORMAL HIGH (ref 65–99)
Glucose-Capillary: 351 mg/dL — ABNORMAL HIGH (ref 65–99)
Glucose-Capillary: 260 mg/dL — ABNORMAL HIGH (ref 65–99)

## 2016-08-09 LAB — HEMOGLOBIN A1C
Hgb A1c MFr Bld: 7.1 % — ABNORMAL HIGH (ref 4.8–5.6)
Mean Plasma Glucose: 157 mg/dL

## 2016-08-09 MED ORDER — INSULIN GLARGINE 100 UNIT/ML ~~LOC~~ SOLN
15.0000 [IU] | Freq: Every day | SUBCUTANEOUS | Status: DC
  Administered 2016-08-10: 15 [IU] via SUBCUTANEOUS
  Filled 2016-08-09: qty 0.15

## 2016-08-09 MED ORDER — LIDOCAINE-PRILOCAINE 2.5-2.5 % EX CREA
TOPICAL_CREAM | CUTANEOUS | Status: DC | PRN
  Administered 2016-08-09 – 2016-08-11 (×2): via TOPICAL
  Filled 2016-08-09: qty 5

## 2016-08-09 MED ORDER — INSULIN ASPART 100 UNIT/ML ~~LOC~~ SOLN
6.0000 [IU] | Freq: Three times a day (TID) | SUBCUTANEOUS | Status: DC
  Administered 2016-08-09 – 2016-08-11 (×5): 6 [IU] via SUBCUTANEOUS
  Filled 2016-08-09: qty 6
  Filled 2016-08-09: qty 13
  Filled 2016-08-09 (×3): qty 6

## 2016-08-09 MED ORDER — TROLAMINE SALICYLATE 10 % EX CREA
TOPICAL_CREAM | CUTANEOUS | Status: DC | PRN
  Administered 2016-08-09 – 2016-08-10 (×2): via TOPICAL
  Filled 2016-08-09: qty 85

## 2016-08-09 MED ORDER — METHYLPREDNISOLONE SODIUM SUCC 125 MG IJ SOLR
60.0000 mg | Freq: Four times a day (QID) | INTRAMUSCULAR | Status: DC
  Administered 2016-08-09 – 2016-08-12 (×11): 60 mg via INTRAVENOUS
  Filled 2016-08-09 (×11): qty 2

## 2016-08-09 MED ORDER — METHYLPREDNISOLONE SODIUM SUCC 125 MG IJ SOLR: 60.0000 mg | Freq: Every day | INTRAMUSCULAR | Status: DC

## 2016-08-09 MED ORDER — INSULIN GLARGINE 100 UNIT/ML ~~LOC~~ SOLN
7.0000 [IU] | Freq: Once | SUBCUTANEOUS | Status: AC
  Administered 2016-08-09: 7 [IU] via SUBCUTANEOUS
  Filled 2016-08-09: qty 0.07

## 2016-08-09 MED ORDER — INSULIN ASPART 100 UNIT/ML ~~LOC~~ SOLN
0.0000 [IU] | Freq: Three times a day (TID) | SUBCUTANEOUS | Status: DC
  Administered 2016-08-09: 20 [IU] via SUBCUTANEOUS
  Administered 2016-08-10 (×2): 15 [IU] via SUBCUTANEOUS
  Administered 2016-08-10: 7 [IU] via SUBCUTANEOUS
  Administered 2016-08-11: 4 [IU] via SUBCUTANEOUS
  Filled 2016-08-09: qty 15
  Filled 2016-08-09: qty 20
  Filled 2016-08-09: qty 15
  Filled 2016-08-09: qty 4

## 2016-08-09 MED ORDER — INSULIN ASPART 100 UNIT/ML ~~LOC~~ SOLN
0.0000 [IU] | Freq: Every day | SUBCUTANEOUS | Status: DC
  Administered 2016-08-09: 3 [IU] via SUBCUTANEOUS
  Administered 2016-08-10: 4 [IU] via SUBCUTANEOUS
  Filled 2016-08-09: qty 4
  Filled 2016-08-09: qty 3

## 2016-08-09 NOTE — Progress Notes (Signed)
Pt has done well this shift.  BiPap O2 has been weaned from 100% down to 65% w/ pt maintaining SaO2 of 95%.  Tolerated activity up to [facility] in attempt to have BM.  Cont to have constipation so sennakot given per prn order.  All CBG>300 and sq insulin administered q4h.  When taking po meds and swallowing water, pt has a wet cough.  This finding reported to CCU NP and Speech Therapy swallow eval ordered per protocol.

## 2016-08-09 NOTE — Evaluation (Signed)
Physical Therapy Evaluation Patient Details Name: Miguel Huber MRN: [id number] DOB: Jan 03, 1928 Today's Date: 08/09/2016   History of Present Illness  83 yo male with onset of respiratory failure after admission for syncope.  He was septic, had lung disease, pulm fibrosis, leukocytosis, renal insuff, Elevated BS.  Admitted to ICU and now re-referred to PT for mobility. PMHx:  COPD, DVT, HTN, OA renal insufficiency.  Clinical Impression  Pt is up to sit bedside with PT after assisting him on bed mob with mod assist.  He is not able to do much more than that as when he moved LE's his RR increased to 37 and his pulse to 113.  Alarms on bed telemetry were evident with limitations so progressed him back to bed to rest.  Have a plan to continue acutely for progression of mobility.    Follow Up Recommendations SNF    Equipment Recommendations  None recommended by PT    Recommendations for Other Services       Precautions / Restrictions Precautions Precautions: Fall (full telemetry) Restrictions Weight Bearing Restrictions: No      Mobility  Bed Mobility Overal bed mobility: Needs Assistance Bed Mobility: Supine to Sit;Sit to Supine;Rolling Rolling: Mod assist   Supine to sit: Mod assist Sit to supine: Max assist   General bed mobility comments: assisted both trunk and LE's back to bed  Transfers                 General transfer comment: deferred due to vitals with bed mob  Ambulation/Gait             General Gait Details: deferred  Stairs            Wheelchair Mobility    Modified Rankin (Stroke Patients Only)       Balance                                             Pertinent Vitals/Pain Pain Assessment: 0-10 Pain Score: 9  Pain Location: L ribcage Pain Descriptors / Indicators: Spasm;Aching Pain Intervention(s): Limited activity within patient's tolerance;Monitored during session;Premedicated before session;Repositioned     Home Living Family/patient expects to be discharged to:: Private residence Living Arrangements: Children Available Help at Discharge: Family;Available 24 hours/day Type of Home: House         Home Equipment: None      Prior Function Level of Independence: Independent               Hand Dominance   Dominant Hand: Right    Extremity/Trunk Assessment   Upper Extremity Assessment Upper Extremity Assessment: Overall WFL for tasks assessed    Lower Extremity Assessment Lower Extremity Assessment: Generalized weakness    Cervical / Trunk Assessment Cervical / Trunk Assessment: Normal  Communication   Communication: Other (comment) (has high flow O2 which hinders speech a little)  Cognition Arousal/Alertness: Awake/alert Behavior During Therapy: WFL for tasks assessed/performed Overall Cognitive Status: Within Functional Limits for tasks assessed                      General Comments      Exercises     Assessment/Plan    PT Assessment Patient needs continued PT services  PT Problem List Decreased strength;Decreased range of motion;Decreased activity tolerance;Decreased balance;Decreased mobility;Decreased coordination;Decreased knowledge of use of DME;Decreased safety awareness;Cardiopulmonary status limiting  activity;Obesity;Decreased skin integrity;Pain       PT Treatment Interventions DME instruction;Gait training;Functional mobility training;Therapeutic activities;Therapeutic exercise;Balance training;Neuromuscular re-education;Patient/family education    PT Goals (Current goals can be found in the Care Plan section)  Acute Rehab PT Goals Patient Stated Goal: to try to get moving again PT Goal Formulation: With patient Time For Goal Achievement: 08/23/16 Potential to Achieve Goals: Good    Frequency Min 2X/week   Barriers to discharge Other (comment) 2 person assist to get home    Co-evaluation               End of Session  Equipment Utilized During Treatment: Oxygen Activity Tolerance: Patient limited by lethargy;Patient limited by pain Patient left: in bed;with call bell/phone within reach;with bed alarm set Nurse Communication: Mobility status PT Visit Diagnosis: Muscle weakness (generalized) (M62.81)    Functional Assessment Tool Used: AM-PAC 6 Clicks Basic Mobility    Time: 2080-2233 PT Time Calculation (min) (ACUTE ONLY): 28 min   Charges:   PT Evaluation $PT Eval Moderate Complexity: 1 Procedure PT Treatments $Therapeutic Activity: 8-22 mins   PT G Codes:   PT G-Codes **NOT FOR INPATIENT CLASS** Functional Assessment Tool Used: AM-PAC 6 Clicks Basic Mobility     Ramond Dial 08/09/2016, 6:08 PM   Mee Hives, PT MS Acute Rehab Dept. Number: [place] and [place]

## 2016-08-09 NOTE — Progress Notes (Signed)
Pt has a protective bandage on nose

## 2016-08-09 NOTE — Plan of Care (Signed)
Problem: Physical Regulation: Goal: Ability to maintain clinical measurements within normal limits will improve Outcome: Progressing Maintaining adequate SaO2 with decreasing oxygen requirements on BiPap.

## 2016-08-09 NOTE — Progress Notes (Signed)
[facility] [place] NAME: Miguel Huber    MR#:  [id number]  DATE OF BIRTH:  Aug 17, 1927  SUBJECTIVE:  CHIEF COMPLAINT:  Patient's shortness of breath is Better today and patient was placed on high flow oxygen through nasal cannulas, now on 80% of FiO2. He lives on 2 L of oxygen at home for chronic pulmonary fibrosis. The patient was seen by pulmonologist as recommended to continue the same, now on high doses of steroids, admits of minimal cough, no  sputum production  REVIEW OF SYSTEMS:  CONSTITUTIONAL: No fever, fatigue or weakness.  EYES: No blurred or double vision.  EARS, NOSE, AND THROAT: No tinnitus or ear pain.  RESPIRATORY: Positive cough,Worsening of shortness of breath , denies wheezing or hemoptysis.  CARDIOVASCULAR: No chest pain, orthopnea, edema.  GASTROINTESTINAL: No nausea, vomiting, diarrhea or abdominal pain.  GENITOURINARY: No dysuria, hematuria.  ENDOCRINE: No polyuria, nocturia,  HEMATOLOGY: No anemia, easy bruising or bleeding SKIN: No rash or lesion. MUSCULOSKELETAL: No joint pain or arthritis.   NEUROLOGIC: No tingling, numbness, weakness.  PSYCHIATRY: No anxiety or depression.   DRUG ALLERGIES:   Allergies  Allergen Reactions  . Theophyllines Hives    VITALS:  Blood pressure (!) 148/89, pulse (!) 107, temperature 97.6 F (36.4 C), temperature source Axillary, resp. rate (!) 29, height 5\' 7"  (1.702 m), weight 84.4 kg (186 lb 1.1 oz), SpO2 92 %.  PHYSICAL EXAMINATION:  GENERAL:  83 y.o.-year-old patient lying in the bed with no acute distress.  EYES: Pupils equal, round, reactive to light and accommodation. No scleral icterus. Extraocular muscles intact.  HEENT: Head atraumatic, normocephalic. Oropharynx and nasopharynx clear.  NECK:  Supple, no jugular venous distention. No thyroid enlargement, no tenderness.  LUNGS: Diminished breath sounds bilaterally, no crackles some wheezing, scattered  rales,rhonchi, no  crepitation. Using accessory muscles of respiration.  CARDIOVASCULAR: S1, S2 normal. No murmurs, rubs, or gallops.  ABDOMEN: Soft, nontender, nondistended. Bowel sounds present. No organomegaly or mass.  EXTREMITIES: No pedal edema, cyanosis, or clubbing.  NEUROLOGIC: Cranial nerves II through XII are intact. Muscle strength 5/5 in all extremities. Sensation intact. Gait not checked.  PSYCHIATRIC: The patient is alert and oriented x 3.  SKIN: No obvious rash, lesion, or ulcer.    LABORATORY PANEL:   CBC  Recent Labs Lab 08/09/16 0522  WBC 11.6*  HGB 13.0  HCT 38.3*  PLT 150   ------------------------------------------------------------------------------------------------------------------  Chemistries   Recent Labs Lab 08/09/16 0522  NA 144  K 4.3  CL 115*  CO2 22  GLUCOSE 316*  BUN 50*  CREATININE 1.79*  CALCIUM 8.4*   ------------------------------------------------------------------------------------------------------------------  Cardiac Enzymes  Recent Labs Lab 08/07/16 1609  TROPONINI 0.03*   ------------------------------------------------------------------------------------------------------------------  RADIOLOGY:  Dg Chest Port 1 View  Result Date: 08/08/2016 CLINICAL DATA:  Acute onset shortness of breath.  Cough, congestion EXAM: PORTABLE CHEST 1 VIEW COMPARISON:  08/07/2016 FINDINGS: Diffuse chronic interstitial prominence throughout the lungs compatible with severe chronic interstitial lung disease/ fibrosis. More confluent opacities noted in the inferior right upper lobe and right lung base. Cannot exclude superimposed pneumonia. Heart is upper limits normal in size. No definite visible effusion. IMPRESSION: Severe chronic lung disease/ fibrosis. Focal airspace opacities in the right mid and lower lung. Findings concerning for superimposed pneumonia. Electronically Signed   By: Rolm Baptise M.D.   On: 08/08/2016 09:30    EKG:    Orders placed or performed during the hospital encounter of 08/06/16  .  ED EKG  . ED EKG  . EKG 12-Lead  . EKG 12-Lead    ASSESSMENT AND PLAN:   83 year old elderly male patient with history of cardiomyopathy, chronic kidney disease, hypertension, hyperlipidemia, pulmonary fibrosis, COPD presented to the emergency room with an episode of passing out.  #Acute hypoxic respiratory failure on chronic respiratory failure; secondary to acute bronchitis with superimposed healthcare associated pneumonia and chronic pulmonary fibrosis Now off Ventimask, and high flow oxygen nasal cannulas, 80% FiO2, continue high doses of steroids intravenously,Nebulizer treatments, Zosyn and vancomycin, get sputum cultures if possible, appreciate pulmonary input. Patient is DO NOT INTUBATE at this time   #.Sepsis- from acute bronchitis with superimposed healthcare associated pneumonia  Patient's antibiotics were changed to IV Zosyn and vancomycin  get sputum culture and sensitivity  Blood cultures negative for 3 days Urine culture with no growth Repeat chest x-ray today with superimposed pneumonia on the right side Leukocytosis is improving  #. Syncope secondary to Hypotension/vasovagal IV fluids. I discontinued, as his blood pressure has improved PT consult pending Acute MI ruled out with negative troponins. Lactic acid is normal  #.  acute on chronic renal failure, known CAD stage III, -improved with IV fluids Baseline creatinine seems to be at 1.87 at baseline, 1.79 today Creatinine 2.48--2.2 --1.96-1.79, at baseline. Avoid nephrotoxins check a.m. labs  # .  chronic Pulmonary fibrosis  follow-up with pulmonology  # Diabetes mellitus with significant Hyperglycemia due to steroids, advance insulin Lantus to 15 units, giving additional dose today, advance sliding scale insulin dosing, follow closely    All the records are reviewed and case discussed with Care Management/Social Workerr. Management  plans discussed with the patient, he is in agreement.  CODE STATUS: FC , daughter Ileene Patrick is the healthcare power of attorney message left to call back for an update  TOTAL CRITICAL CARE TIME TAKING CARE OF THIS PATIENT: 35 minutes.   POSSIBLE D/C IN 1-2  DAYS, DEPENDING ON CLINICAL CONDITION.  Note: This dictation was prepared with Dragon dictation along with smaller phrase technology. Any transcriptional errors that result from this process are unintentional.   Teresina Bugaj M.D on 08/09/2016 at 3:42 PM  Between 7am to 6pm - Pager - [phone] After 6pm go to [email or web address] - password [email or web address] [facility]  Office  [phone]  CC: Primary care physician; Viviana Simpler, MD

## 2016-08-09 NOTE — Progress Notes (Addendum)
[facility] Progess Note    ASSESSMENT/PLAN  Respiratory failure. Significant improvement. Patient is much improved on noninvasive ventilation. Is presently being treated with vancomycin, Zosyn, Solu-Medrol, albuterol, Atrovent for his underlying pulmonary fibrosis and COPD. Will try to wean him from noninvasive today. He is a DO NOT INTUBATE at this time  Leukocytosis. Improvement in white count. Continue broad-spectrum anabiotic coverage  Renal insufficiency. His BUN is 50 and creatinine 1.79. Follow closely  Hyperglycemia. Presently on sliding scale management. Most likely elevated secondary to the addition of systemic corticosteroids  Pulmonary hypertension. A little bit high for just primary pulmonary disease most certainly a component of both pulmonary fibrosis and chronic objective pulmonary disease may also reflect a component of left ventricular diastolic dysfunction  Name: Miguel Huber MRN: [id number] DOB: Jan 07, 1928    ADMISSION DATE:  08/06/2016   SUBJECTIVE:   Patient was moved to the intensive care unit yesterday and started on noninvasive ventilation with significant improvement. He states his shortness of breath has dramatically improved and he is much more comfortable. Also discussion last night patient does not wish to be intubated if his respiratory status were to deteriorate..   Review of Systems:  Constitutional: Feels well. Cardiovascular: No chest pain.  Pulmonary: Improved dyspnea.    VITAL SIGNS: Temp:  [97.6 F (36.4 C)-98.7 F (37.1 C)] 97.6 F (36.4 C) (03/17 0700) Pulse Rate:  [75-108] 75 (03/17 0700) Resp:  [14-34] 19 (03/17 0700) BP: (93-149)/(57-87) 116/75 (03/17 0700) SpO2:  [91 %-98 %] 95 % (03/17 0700) FiO2 (%):  [65 %-100 %] 65 % (03/17 0410) Weight:  [84.4 kg (186 lb 1.1 oz)] 84.4 kg (186 lb 1.1 oz) (03/16 1800) HEMODYNAMICS:   VENTILATOR SETTINGS: FiO2 (%):  [65 %-100 %] 65 % INTAKE /  OUTPUT:  Intake/Output Summary (Last 24 hours) at 08/09/16 0824 Last data filed at 08/09/16 0814  Gross per 24 hour  Intake             1310 ml  Output              800 ml  Net              510 ml    PHYSICAL EXAMINATION: Physical Examination:   VS: BP 116/75   Pulse 75   Temp 97.6 F (36.4 C) (Axillary)   Resp 19   Ht 5\' 7"  (1.702 m)   Wt 84.4 kg (186 lb 1.1 oz)   SpO2 95%   BMI 29.14 kg/m   General Appearance: No distress, Wearing BiPAP Neuro:without focal findings, mental status normal. HEENT: PERRLA, EOM intact. Pulmonary: Much improved, basilar crackles appreciated on inspiration   CardiovascularNormal S1,S2.  Systolic murmur left parasternal border   Abdomen: Benign, Soft, non-tender. Renal:  No costovertebral tenderness  Skin:   warm, no rashes, no ecchymosis  Musculoskeletal: normal, no cyanosis, clubbing.   LABS:   LABORATORY PANEL:   CBC  Recent Labs Lab 08/09/16 0522  WBC 11.6*  HGB 13.0  HCT 38.3*  PLT 150    Chemistries   Recent Labs Lab 08/09/16 0522  NA 144  K 4.3  CL 115*  CO2 22  GLUCOSE 316*  BUN 50*  CREATININE 1.79*  CALCIUM 8.4*     Recent Labs Lab 08/08/16 1702 08/08/16 1744 08/08/16 2059 08/09/16 0025 08/09/16 0351 08/09/16 0731  GLUCAP 318* 328* 368* 385* 317* 275*   No results for input(s): PHART, PCO2ART, PO2ART in the last 168 hours. No results  for input(s): AST, ALT, ALKPHOS, BILITOT, ALBUMIN in the last 168 hours.  Cardiac Enzymes  Recent Labs Lab 08/07/16 1609  TROPONINI 0.03*    RADIOLOGY:  Dg Chest Port 1 View  Result Date: 08/08/2016 CLINICAL DATA:  Acute onset shortness of breath.  Cough, congestion EXAM: PORTABLE CHEST 1 VIEW COMPARISON:  08/07/2016 FINDINGS: Diffuse chronic interstitial prominence throughout the lungs compatible with severe chronic interstitial lung disease/ fibrosis. More confluent opacities noted in the inferior right upper lobe and right lung base. Cannot exclude  superimposed pneumonia. Heart is upper limits normal in size. No definite visible effusion. IMPRESSION: Severe chronic lung disease/ fibrosis. Focal airspace opacities in the right mid and lower lung. Findings concerning for superimposed pneumonia. Electronically Signed   By: Rolm Baptise M.D.   On: 08/08/2016 09:30    Hermelinda Dellen, DO 08/09/2016

## 2016-08-09 NOTE — Evaluation (Signed)
Clinical/Bedside Swallow Evaluation Patient Details  Name: Miguel Huber MRN: [id number] Date of Birth: [birth year]  Today's Date: 08/09/2016 Time: SLP Start Time (ACUTE ONLY): 1010 SLP Stop Time (ACUTE ONLY): 1043 SLP Time Calculation (min) (ACUTE ONLY): 33 min  Past Medical History:  Past Medical History:  Diagnosis Date  . Allergic rhinitis   . Cardiomyopathy   . COPD (chronic obstructive pulmonary disease) ([place])   . Diabetes mellitus   . DVT (deep venous thrombosis), right 5/13  . Erectile dysfunction   . GERD (gastroesophageal reflux disease)   . Hx of adenomatous colonic polyps   . Hyperlipidemia   . Hypertension   . Osteoarthritis   . Pulmonary fibrosis ([place])   . Renal insufficiency    Past Surgical History:  Past Surgical History:  Procedure Laterality Date  . CARDIOVASCULAR STRESS TEST     Myoview stress negative EF 65-70% 08/04  . CATARACT EXTRACTION     Cataracts/ IOL OU, [place] 2005, [place] ?  2000  . LUNG BIOPSY     Right lung bx. negative 2001  . ORIF CLAVICLE FRACTURE     Left clavicle fracture- child  . ORIF ULNAR FRACTURE     Left readius/ ulna fracture child   HPI:      Assessment / Plan / Recommendation Clinical Impression  pt presents with a severe oral pharyngeal aspiration characterized by increased a-p transit, increased respiratory rate during and post swallow, oral holding, and throat clear/ cough immediatly and post swallow. pt does have pna,.  pt ssx aspiration decreased with puree and honey thick liquids however cough persisted. SLP completed education with pt, nursing and md on st recommendations and diet restrictions.   All gave verbal understanding and agreement. SLP Visit Diagnosis: Dysphagia, oropharyngeal phase (R13.12)    Aspiration Risk  Severe aspiration risk    Diet Recommendation Dysphagia 1 (Puree);Honey-thick liquid   Liquid Administration via: Spoon;Straw;Cup Medication Administration: Crushed with puree Supervision:  Staff to assist with self feeding Compensations: Slow rate;Small sips/bites;Follow solids with liquid Postural Changes: Seated upright at 90 degrees    Other  Recommendations Oral Care Recommendations: Oral care BID   Follow up Recommendations        Frequency and Duration min 3x week  2 weeks       Prognosis Prognosis for Safe Diet Advancement: Good Barriers to Reach Goals: Severity of deficits      Swallow Study   General Date of Onset: 08/07/16 Type of Study: Bedside Swallow Evaluation Diet Prior to this Study: Thin liquids;Regular Temperature Spikes Noted: No Respiratory Status: Nasal cannula Behavior/Cognition: Alert;Cooperative;Pleasant mood Oral Cavity Assessment: Within Functional Limits Oral Care Completed by SLP: No Oral Cavity - Dentition: Adequate natural dentition Vision: Functional for self-feeding Self-Feeding Abilities: Able to feed self Patient Positioning: Upright in bed Baseline Vocal Quality: Breathy;Hoarse;Low vocal intensity Volitional Cough: Weak Volitional Swallow: Unable to elicit    Oral/Motor/Sensory Function Overall Oral Motor/Sensory Function: Within functional limits   Ice Chips Ice chips: Impaired Oral Phase Impairments: Reduced labial seal Pharyngeal Phase Impairments: Throat Clearing - Delayed;Cough - Immediate;Cough - Delayed   Thin Liquid Thin Liquid: Impaired Presentation: Spoon;Cup Oral Phase Functional Implications: Left anterior spillage Pharyngeal  Phase Impairments: Cough - Immediate;Cough - Delayed;Wet Vocal Quality;Throat Clearing - Immediate;Change in Vital Signs    Nectar Thick Nectar Thick Liquid: Not tested   Honey Thick Honey Thick Liquid: Within functional limits Presentation: Cup;Spoon   Puree Puree: Within functional limits Presentation: Spoon   Solid   GO  Solid: Impaired Presentation: Spoon Oral Phase Impairments: Reduced labial seal;Impaired mastication Pharyngeal Phase Impairments: Suspected delayed  Swallow;Decreased hyoid-laryngeal movement;Cough - Immediate;Change in Vital Signs;Cough - Delayed        Miguel Huber 08/09/2016,11:43 AM

## 2016-08-10 LAB — GLUCOSE, CAPILLARY
Glucose-Capillary: 319 mg/dL — ABNORMAL HIGH (ref 65–99)
Glucose-Capillary: 341 mg/dL — ABNORMAL HIGH (ref 65–99)
Glucose-Capillary: 216 mg/dL — ABNORMAL HIGH (ref 65–99)
Glucose-Capillary: 333 mg/dL — ABNORMAL HIGH (ref 65–99)

## 2016-08-10 LAB — BASIC METABOLIC PANEL
ANION GAP: 5 (ref 5–15)
BUN: 64 mg/dL — AB (ref 6–20)
CO2: 23 mmol/L (ref 22–32)
CREATININE: 2.01 mg/dL — AB (ref 0.61–1.24)
Calcium: 8.3 mg/dL — ABNORMAL LOW (ref 8.9–10.3)
Chloride: 117 mmol/L — ABNORMAL HIGH (ref 101–111)
GFR, EST AFRICAN AMERICAN: 33 mL/min — AB (ref >60)
GFR, EST NON AFRICAN AMERICAN: 28 mL/min — AB (ref >60)
Glucose, Bld: 300 mg/dL — ABNORMAL HIGH (ref 65–99)
POTASSIUM: 4.7 mmol/L (ref 3.5–5.1)
Sodium: 145 mmol/L (ref 135–145)

## 2016-08-10 LAB — PROCALCITONIN: Procalcitonin: 0.51 ng/mL

## 2016-08-10 LAB — HEMOGLOBIN A1C
HEMOGLOBIN A1C: 7.3 % — AB (ref 4.8–5.6)
Mean Plasma Glucose: 163 mg/dL

## 2016-08-10 LAB — PHOSPHORUS: Phosphorus: 3.1 mg/dL (ref 2.5–4.6)

## 2016-08-10 LAB — MAGNESIUM: MAGNESIUM: 2.6 mg/dL — AB (ref 1.7–2.4)

## 2016-08-10 MED ORDER — INSULIN GLARGINE 100 UNIT/ML ~~LOC~~ SOLN
22.0000 [IU] | Freq: Every day | SUBCUTANEOUS | Status: DC
  Administered 2016-08-11: 22 [IU] via SUBCUTANEOUS
  Filled 2016-08-10: qty 0.22

## 2016-08-10 MED ORDER — LIDOCAINE 5 % EX PTCH
1.0000 | MEDICATED_PATCH | CUTANEOUS | Status: DC
  Administered 2016-08-10 – 2016-08-17 (×8): 1 via TRANSDERMAL
  Filled 2016-08-10 (×9): qty 1

## 2016-08-10 MED ORDER — SODIUM CHLORIDE 0.45 % IV SOLN
INTRAVENOUS | Status: DC
Start: 2016-08-10 — End: 2016-08-12
  Administered 2016-08-10: 50 mL/h via INTRAVENOUS

## 2016-08-10 MED ORDER — INSULIN GLARGINE 100 UNIT/ML ~~LOC~~ SOLN
7.0000 [IU] | Freq: Once | SUBCUTANEOUS | Status: AC
Start: 2016-08-10 — End: 2016-08-10
  Administered 2016-08-10: 7 [IU] via SUBCUTANEOUS
  Filled 2016-08-10: qty 0.07

## 2016-08-10 NOTE — Progress Notes (Signed)
[facility] [place] NAME: Miguel Huber    MR#:  [id number]  DATE OF BIRTH:  08-Jun-1927  SUBJECTIVE:  CHIEF COMPLAINT:  Patient's shortness of breath is Better today and patient was placed on high flow oxygen through nasal cannulas, now on 50% of FiO2. He lives on 2 L of oxygen at home for chronic pulmonary fibrosis. The patient was seen by pulmonologist as recommended to continue the same, now on high doses of steroids, admits of minimal cough, no  sputum production. Overall feels comfortable, better than yesterday, no chest pains  REVIEW OF SYSTEMS:  CONSTITUTIONAL: No fever, fatigue or weakness.  EYES: No blurred or double vision.  EARS, NOSE, AND THROAT: No tinnitus or ear pain.  RESPIRATORY: Positive cough,Worsening of shortness of breath , denies wheezing or hemoptysis.  CARDIOVASCULAR: No chest pain, orthopnea, edema.  GASTROINTESTINAL: No nausea, vomiting, diarrhea or abdominal pain.  GENITOURINARY: No dysuria, hematuria.  ENDOCRINE: No polyuria, nocturia,  HEMATOLOGY: No anemia, easy bruising or bleeding SKIN: No rash or lesion. MUSCULOSKELETAL: No joint pain or arthritis.   NEUROLOGIC: No tingling, numbness, weakness.  PSYCHIATRY: No anxiety or depression.   DRUG ALLERGIES:   Allergies  Allergen Reactions  . Theophyllines Hives    VITALS:  Blood pressure (!) 150/88, pulse (!) 109, temperature 98.5 F (36.9 C), temperature source Axillary, resp. rate (!) 33, height 5\' 7"  (1.702 m), weight 84.4 kg (186 lb 1.1 oz), SpO2 94 %.  PHYSICAL EXAMINATION:  GENERAL:  83 y.o.-year-old patient lying in the bed In mild-to-moderate respiratory distress, tachypneic, somewhat uncomfortable, using accessory muscles to breathe.  EYES: Pupils equal, round, reactive to light and accommodation. No scleral icterus. Extraocular muscles intact.  HEENT: Head atraumatic, normocephalic. Oropharynx and nasopharynx clear.  NECK:  Supple, no  jugular venous distention. No thyroid enlargement, no tenderness.  LUNGS: Diminished breath sounds bilaterally, no crackles wheezing,  rales,rhonchi, no  crepitation. Using accessory muscles of respiration, Not tachypneic, uncomfortable.  CARDIOVASCULAR: S1, S2 normal. No murmurs, rubs, or gallops.  ABDOMEN: Soft, nontender, nondistended. Bowel sounds present. No organomegaly or mass.  EXTREMITIES: No pedal edema, cyanosis, or clubbing.  NEUROLOGIC: Cranial nerves II through XII are intact. Muscle strength 5/5 in all extremities. Sensation intact. Gait not checked.  PSYCHIATRIC: The patient is alert and oriented x 3.  SKIN: No obvious rash, lesion, or ulcer.    LABORATORY PANEL:   CBC  Recent Labs Lab 08/09/16 0522  WBC 11.6*  HGB 13.0  HCT 38.3*  PLT 150   ------------------------------------------------------------------------------------------------------------------  Chemistries   Recent Labs Lab 08/10/16 0425  NA 145  K 4.7  CL 117*  CO2 23  GLUCOSE 300*  BUN 64*  CREATININE 2.01*  CALCIUM 8.3*  MG 2.6*   ------------------------------------------------------------------------------------------------------------------  Cardiac Enzymes  Recent Labs Lab 08/07/16 1609  TROPONINI 0.03*   ------------------------------------------------------------------------------------------------------------------  RADIOLOGY:  No results found.  EKG:   Orders placed or performed during the hospital encounter of 08/06/16  . ED EKG  . ED EKG  . EKG 12-Lead  . EKG 12-Lead    ASSESSMENT AND PLAN:   83 year old elderly male patient with history of cardiomyopathy, chronic kidney disease, hypertension, hyperlipidemia, pulmonary fibrosis, COPD presented to the emergency room with an episode of passing out.  #Acute hypoxic respiratory failure on chronic respiratory failure; secondary to acute bronchitis with superimposed healthcare associated pneumonia and chronic  pulmonary fibrosis Now off Ventimask, and high flow oxygen nasal cannulas, 50% FiO2, continue high  doses of steroids intravenously,Nebulizer treatments, Zosyn ,  get sputum cultures if possible, appreciate pulmonary input. Patient is DO NOT INTUBATE at this time. Blood cultures are negative so far   #.Sepsis- from acute bronchitis with superimposed healthcare associated pneumonia  Patient's antibiotics were changed to IV Zosyn and vancomycin  Vancomycin was discontinued due to MRSA PCR being negative, remains afebrile, sputum cultures are pending, Blood cultures negative for 4 days Urine culture with no growth Repeat chest x-ray 3/16 with superimposed pneumonia on the right side Leukocytosis is improving. The patient does have difficulty swallowing, coughing with swallowing, diet is downgraded to dysphagia with thickened liquids, appreciate speech therapist input  #. Syncope secondary to Hypotension/vasovagal IV fluids. I discontinued, as his blood pressure has improved PT evaluated patient and recommended skilled nursing facility placement for rehabilitation Acute MI ruled out with negative troponins. Lactic acid is normal  #.  acute on chronic renal failure, known CAD stage III, worsened today, resuming IV fluids at lower rate, follow creatinine in the morning Baseline creatinine seems to be at 1.87 at baseline, 2. 01 today Creatinine 2.48--2.2 --1.96-1.79- 2.01. Avoid nephrotoxins check a.m. labs  # .  chronic Pulmonary fibrosis  follow-up with pulmonology  # Diabetes mellitus with significant Hyperglycemia due to steroids, advance insulin Lantus to 22 units, giving additional dose today again, advanced sliding scale insulin dosing, following closely    All the records are reviewed and case discussed with Care Management/Social Workerr. Management plans discussed with the patient, he is in agreement.  CODE STATUS: FC , daughter Ileene Patrick is the healthcare power of attorney  message left to call back for an update  TOTAL CRITICAL CARE TIME TAKING CARE OF THIS PATIENT: 40 minutes.   POSSIBLE D/C IN 1-2  DAYS, DEPENDING ON CLINICAL CONDITION.  Note: This dictation was prepared with Dragon dictation along with smaller phrase technology. Any transcriptional errors that result from this process are unintentional.   Katelinn Justice M.D on 08/10/2016 at 2:56 PM  Between 7am to 6pm - Pager - [phone] After 6pm go to [email or web address] - password [email or web address] [facility]  Office  [phone]  CC: Primary care physician; Viviana Simpler, MD

## 2016-08-10 NOTE — Progress Notes (Signed)
Patient alert, cooperative and pleasant.  Denied pain at the beginning of the shift.  Needed frequent reminders to breathe in through his nose and out through his mouth to keep his O2 sats up. RR increase when patient does this.  Afebrile throughout this shift.  No complaints of pain until this time when getting up to use the [facility].  Complained of left side rib pain.  Aspercreme and Percocet given to the patient.  Effective for some relief. Patient passed some flatus on the toilet. No BM at this time. Urine output adequate. Lungs diminished throughout. Generalized edema all extremities.  SCDs on patient. Will continue to monitor.

## 2016-08-10 NOTE — Progress Notes (Signed)
[facility] Progess Note    ASSESSMENT/PLAN  Respiratory failure. Significant improvement. Doing well on high flow oxygen presently at 50%. Is presently being treated with vancomycin, Zosyn, Solu-Medrol, albuterol, Atrovent for his underlying pulmonary fibrosis and COPD. We'll try to wean he did high flow down to 35% so he can return to the floor. He is a DO NOT INTUBATE at this time  Leukocytosis. Improvement in white count. Continue broad-spectrum anabiotic coverage  Renal insufficiency. His BUN is 64 and creatinine 2. At this point no clear indication for hemodialysis. If renal insufficiency progresses will require nephrology consultation and further evaluation  Hyperglycemia. Presently on sliding scale management. Most likely elevated secondary to the addition of systemic corticosteroids  Pulmonary hypertension. A little bit high for just primary pulmonary disease most certainly a component of both pulmonary fibrosis and chronic objective pulmonary disease may also reflect a component of left ventricular diastolic dysfunction  Name: Miguel Huber MRN: [id number] DOB: 02-28-1928    ADMISSION DATE:  08/06/2016   SUBJECTIVE:   Patient was able to be weaned to he did high flow oxygen and is on decreasing FiO2 requirements. Still requiring 50% however. Also has had some worsening renal insufficiency  Review of Systems:  Constitutional: Feels well. Cardiovascular: No chest pain.  Pulmonary: Improved dyspnea.    VITAL SIGNS: Temp:  [98.4 F (36.9 C)-98.8 F (37.1 C)] 98.8 F (37.1 C) (03/18 0100) Pulse Rate:  [73-113] 86 (03/18 0600) Resp:  [19-35] 30 (03/18 0600) BP: (113-148)/(60-90) 130/79 (03/18 0600) SpO2:  [90 %-96 %] 92 % (03/18 0600) FiO2 (%):  [50 %-80 %] 65 % (03/18 0332) HEMODYNAMICS:   VENTILATOR SETTINGS: FiO2 (%):  [50 %-80 %] 65 % INTAKE / OUTPUT:  Intake/Output Summary (Last 24 hours) at 08/10/16 0748 Last data filed at 08/10/16  0503  Gross per 24 hour  Intake             1530 ml  Output             1905 ml  Net             -375 ml    PHYSICAL EXAMINATION: Physical Examination:   VS: BP 130/79   Pulse 86   Temp 98.8 F (37.1 C) (Oral)   Resp (!) 30   Ht 5\' 7"  (1.702 m)   Wt 84.4 kg (186 lb 1.1 oz)   SpO2 92%   BMI 29.14 kg/m   General Appearance: No distress, Wearing BiPAP Neuro:without focal findings, mental status normal. HEENT: PERRLA, EOM intact. Pulmonary: Much improved, basilar crackles appreciated on inspiration   CardiovascularNormal S1,S2.  Systolic murmur left parasternal border   Abdomen: Benign, Soft, non-tender. Renal:  No costovertebral tenderness  Skin:   warm, no rashes, no ecchymosis  Musculoskeletal: normal, no cyanosis, clubbing.   LABS:   LABORATORY PANEL:   CBC  Recent Labs Lab 08/09/16 0522  WBC 11.6*  HGB 13.0  HCT 38.3*  PLT 150    Chemistries   Recent Labs Lab 08/10/16 0425  NA 145  K 4.7  CL 117*  CO2 23  GLUCOSE 300*  BUN 64*  CREATININE 2.01*  CALCIUM 8.3*  MG 2.6*  PHOS 3.1     Recent Labs Lab 08/09/16 0731 08/09/16 1148 08/09/16 1533 08/09/16 1944 08/09/16 2221 08/10/16 0709  GLUCAP 275* 351* 358* 226* 260* 319*   No results for input(s): PHART, PCO2ART, PO2ART in the last 168 hours. No results for input(s): AST, ALT,  ALKPHOS, BILITOT, ALBUMIN in the last 168 hours.  Cardiac Enzymes  Recent Labs Lab 08/07/16 1609  TROPONINI 0.03*    RADIOLOGY:  Dg Chest Port 1 View  Result Date: 08/08/2016 CLINICAL DATA:  Acute onset shortness of breath.  Cough, congestion EXAM: PORTABLE CHEST 1 VIEW COMPARISON:  08/07/2016 FINDINGS: Diffuse chronic interstitial prominence throughout the lungs compatible with severe chronic interstitial lung disease/ fibrosis. More confluent opacities noted in the inferior right upper lobe and right lung base. Cannot exclude superimposed pneumonia. Heart is upper limits normal in size. No definite visible  effusion. IMPRESSION: Severe chronic lung disease/ fibrosis. Focal airspace opacities in the right mid and lower lung. Findings concerning for superimposed pneumonia. Electronically Signed   By: Rolm Baptise M.D.   On: 08/08/2016 09:30    Hermelinda Dellen, DO 08/10/2016

## 2016-08-11 DIAGNOSIS — R55 Syncope and collapse: Secondary | ICD-10-CM

## 2016-08-11 DIAGNOSIS — J8410 Pulmonary fibrosis, unspecified: Secondary | ICD-10-CM

## 2016-08-11 LAB — BASIC METABOLIC PANEL
ANION GAP: 7 (ref 5–15)
BUN: 73 mg/dL — ABNORMAL HIGH (ref 6–20)
CO2: 22 mmol/L (ref 22–32)
Calcium: 8.6 mg/dL — ABNORMAL LOW (ref 8.9–10.3)
Chloride: 116 mmol/L — ABNORMAL HIGH (ref 101–111)
Creatinine, Ser: 1.88 mg/dL — ABNORMAL HIGH (ref 0.61–1.24)
GFR, EST AFRICAN AMERICAN: 36 mL/min — AB (ref >60)
GFR, EST NON AFRICAN AMERICAN: 31 mL/min — AB (ref >60)
Glucose, Bld: 297 mg/dL — ABNORMAL HIGH (ref 65–99)
POTASSIUM: 4.5 mmol/L (ref 3.5–5.1)
SODIUM: 145 mmol/L (ref 135–145)

## 2016-08-11 LAB — CULTURE, BLOOD (ROUTINE X 2)
CULTURE: NO GROWTH
Culture: NO GROWTH

## 2016-08-11 LAB — GLUCOSE, CAPILLARY
GLUCOSE-CAPILLARY: 179 mg/dL — AB (ref 65–99)
GLUCOSE-CAPILLARY: 197 mg/dL — AB (ref 65–99)
GLUCOSE-CAPILLARY: 173 mg/dL — AB (ref 65–99)
GLUCOSE-CAPILLARY: 177 mg/dL — AB (ref 65–99)
GLUCOSE-CAPILLARY: 173 mg/dL — AB (ref 65–99)
GLUCOSE-CAPILLARY: 133 mg/dL — AB (ref 65–99)
Glucose-Capillary: 275 mg/dL — ABNORMAL HIGH (ref 65–99)
Glucose-Capillary: 368 mg/dL — ABNORMAL HIGH (ref 65–99)
Glucose-Capillary: 341 mg/dL — ABNORMAL HIGH (ref 65–99)
Glucose-Capillary: 365 mg/dL — ABNORMAL HIGH (ref 65–99)
Glucose-Capillary: 296 mg/dL — ABNORMAL HIGH (ref 65–99)
Glucose-Capillary: 241 mg/dL — ABNORMAL HIGH (ref 65–99)

## 2016-08-11 MED ORDER — INSULIN GLARGINE 100 UNIT/ML ~~LOC~~ SOLN
7.0000 [IU] | Freq: Once | SUBCUTANEOUS | Status: AC
  Administered 2016-08-11: 7 [IU] via SUBCUTANEOUS
  Filled 2016-08-11: qty 0.07

## 2016-08-11 MED ORDER — INSULIN GLARGINE 100 UNIT/ML ~~LOC~~ SOLN
29.0000 [IU] | Freq: Every day | SUBCUTANEOUS | Status: DC
  Administered 2016-08-12 – 2016-08-14 (×3): 29 [IU] via SUBCUTANEOUS
  Filled 2016-08-11 (×3): qty 0.29

## 2016-08-11 MED ORDER — MENTHOL 3 MG MT LOZG
1.0000 | LOZENGE | OROMUCOSAL | Status: DC | PRN
  Administered 2016-08-11 – 2016-08-17 (×3): 3 mg via ORAL
  Filled 2016-08-11: qty 9

## 2016-08-11 MED ORDER — SODIUM CHLORIDE 0.9 % IV SOLN
INTRAVENOUS | Status: DC
  Administered 2016-08-11: 12:00:00 via INTRAVENOUS
  Filled 2016-08-11: qty 2.5

## 2016-08-11 NOTE — Progress Notes (Signed)
Nutrition Follow-up  DOCUMENTATION CODES:   Not applicable  INTERVENTION:  -Honey Thick Mighty Shakes and Magic Cup on meal trays  NUTRITION DIAGNOSIS:   Unintentional weight loss related to chronic illness as evidenced by per patient/family report.  Progressing as pt eating 90-100% of meals, taking supplements  GOAL:   Patient will meet greater than or equal to 90% of their needs  MONITOR:   PO intake, I & O's, Labs, Weight trends, Supplement acceptance  REASON FOR ASSESSMENT:   Consult Assessment of nutrition requirement/status  ASSESSMENT:   Miguel Huber  is a 83 y.o. male with a known history of Pulmonary fibrosis, type 2 diabetes mellitus, COPD, cardiomyopathy, erectile dysfunction, GERD, hypertension, hyperlipidemia, chronic kidney disease presented to the emergency room after he passed out.  Pt currently on HFNC Diet downgraded to Dysphagia I, Honey Thick; SLP following Pt with good appetite; eating 90-100% of meals including Magic Cup Started on Insulin Drip for elevated CBGs Labs: CBGs 216-368 Meds: solumedrol, insulin drip, 1/2 NS at 50 ml/hr  Diet Order:  DIET - DYS 1 Room service appropriate? Yes; Fluid consistency: Honey Thick  Skin:  Reviewed, no issues  Last BM:  08/07/2016  Height:   Ht Readings from Last 1 Encounters:  08/08/16 5\' 7"  (1.702 m)    Weight:   Wt Readings from Last 1 Encounters:  08/08/16 186 lb 1.1 oz (84.4 kg)    Ideal Body Weight:  70 kg  BMI:  Body mass index is 29.14 kg/m.  Estimated Nutritional Needs:   Kcal:  1600-2000 calories  Protein:  80-95 gn  Fluid:  >/= 1.6L  EDUCATION NEEDS:   Education needs no appropriate at this time  Kerman Passey [place], [place], [place] [phone] Pager  [phone] Weekend/On-Call Pager

## 2016-08-11 NOTE — Progress Notes (Signed)
Inpatient Diabetes Program Recommendations  AACE/ADA: New Consensus Statement on Inpatient Glycemic Control (2015)  Target Ranges:  Prepandial:   less than 140 mg/dL      Peak postprandial:   less than 180 mg/dL (1-2 hours)      Critically ill patients:  140 - 180 mg/dL   Review of Glycemic Control  Diabetes history: DM2 Outpatient Diabetes medications: NPH 25 units BID, Regular 10 units BID Current orders for Inpatient glycemic control: Lantus 22 units daily, Novolog 0-20 units TID with meals, Novolog 0-5 units QHS, Novolog 6 units TID meal coverage  Inpatient Diabetes Program Recommendations:  Insulin - Basal: Patient still ordered IV Solumedrol 60 mg Q6 hours.  Patient takes a total of 50 units Daily of NPH at home. Please consider increasing Lantus to 26 units daily.    Thanks,  Tama Headings RN, MSN, [facility] Inpatient Diabetes Coordinator Team Pager [phone] (8a-5p)

## 2016-08-11 NOTE — Progress Notes (Signed)
After eating breakfast, pt.'s Sa02 dropped in the 70's. The Fi02 on the HFNC was increased to 100%. It took a few minutes for the Sa02 to increase. Once it did I was able to decrease the Fi02 to 85%. Will continue to decrease the Fi02 when possible.

## 2016-08-11 NOTE — Progress Notes (Signed)
[facility] Progess Note    ASSESSMENT/PLAN   83 yo male who is DNI with h/o pulmonary fibrosis, cardiomyopathy, COPD presented with syncope. Possible pneumonia with sepsis and AKI.   PULMONARY A: Pulmonary fibrosis.  Currently on high flow at 50% DNI.  P:   Wean down oxygen as tolerated.   CARDIOVASCULAR A: Essential hypertension.  P:  Will monitor and treat as needed. Takes lisinopril at home, can restart as renal function improves.   RENAL A:  Creatinine improving. Urine output adequate.  P:   Will continue to monitor.   GASTROINTESTINAL A:  -- P:   --  HEMATOLOGIC A:  -- P:  --  INFECTIOUS A:  Continue abx empirically for possible pneumonia.  P:    Micro/culture results:  BCx2 3/14; negative UC  3/14; negative.  Sputum -- MRSA PCR 3/16; negative.   Antibiotics: Zosyn 3/16>>  ENDOCRINE A:  DM.  P:   Continue SSI.  Continue lantus 22 units qday.   NEUROLOGIC A:  --  MAJOR EVENTS/TEST RESULTS:   Best Practices  DVT Prophylaxis:heparin SQ GI Prophylaxis: protonix.    ---------------------------------------   ----------------------------------------   Name: Miguel Huber MRN: [id number] DOB: [birth year]    ADMISSION DATE:  08/06/2016   SUBJECTIVE:   Pt currently has reduced mental status and can not provide history or review of systems.   Review of Systems:  --  VITAL SIGNS: Temp:  [97.5 F (36.4 C)-98.7 F (37.1 C)] 98.5 F (36.9 C) (03/19 0755) Pulse Rate:  [82-109] 91 (03/19 0755) Resp:  [22-33] 25 (03/19 0755) BP: (116-157)/(63-111) 157/78 (03/19 0700) SpO2:  [89 %-98 %] 94 % (03/19 0755) FiO2 (%):  [47 %-50 %] 50 % (03/19 0755) HEMODYNAMICS:   VENTILATOR SETTINGS: FiO2 (%):  [47 %-50 %] 50 % INTAKE / OUTPUT:  Intake/Output Summary (Last 24 hours) at 08/11/16 0928 Last data filed at 08/11/16 0800  Gross per 24 hour  Intake          1544.17 ml  Output             1625 ml  Net            -80.83 ml    PHYSICAL EXAMINATION: Physical Examination:   VS: BP (!) 157/78   Pulse 91   Temp 98.5 F (36.9 C) (Oral)   Resp (!) 25   Ht 5\' 7"  (1.702 m)   Wt 186 lb 1.1 oz (84.4 kg)   SpO2 94%   BMI 29.14 kg/m   General Appearance: No distress  Neuro:without focal findings, mental status reduced.  HEENT: PERRLA, EOM intact. Pulmonary: diffuse bilateral crackles.  CardiovascularNormal S1,S2.  No m/r/g.   Abdomen: Benign, Soft, non-tender. Renal:  No costovertebral tenderness  GU:  Not performed at this time. Endocrine: No evident thyromegaly. Skin:   warm, no rashes, no ecchymosis  Extremities: normal, no cyanosis, clubbing.   LABS:   LABORATORY PANEL:   CBC  Recent Labs Lab 08/09/16 0522  WBC 11.6*  HGB 13.0  HCT 38.3*  PLT 150    Chemistries   Recent Labs Lab 08/10/16 0425 08/11/16 0424  NA 145 145  K 4.7 4.5  CL 117* 116*  CO2 23 22  GLUCOSE 300* 297*  BUN 64* 73*  CREATININE 2.01* 1.88*  CALCIUM 8.3* 8.6*  MG 2.6*  --   PHOS 3.1  --      Recent Labs Lab 08/09/16 2221 08/10/16 0709 08/10/16 1125 08/10/16 1642 08/10/16  2100 08/11/16 0752  GLUCAP 260* 319* 341* 216* 333* 275*   No results for input(s): PHART, PCO2ART, PO2ART in the last 168 hours. No results for input(s): AST, ALT, ALKPHOS, BILITOT, ALBUMIN in the last 168 hours.  Cardiac Enzymes  Recent Labs Lab 08/07/16 1609  TROPONINI 0.03*    RADIOLOGY:  No results found.     Marda Stalker, MD.  ICU Pager: [phone] [facility] Office Number: [phone]   08/11/2016   Critical Care Attestation.  I have personally obtained a history, examined the patient, evaluated laboratory and imaging results, formulated the assessment and plan and placed orders. The Patient requires high complexity decision making for assessment and support, frequent evaluation and titration of therapies, application of advanced monitoring technologies  and extensive interpretation of multiple databases. The patient has critical illness that could lead imminently to failure of 1 or more organ systems and requires the highest level of physician preparedness to intervene.  Critical Care Time devoted to patient care services described in this note is 40 minutes and is exclusive of time spent in procedures.

## 2016-08-11 NOTE — Progress Notes (Signed)
Miguel Huber NAME: Miguel Huber    MR#:  [id number]  DATE OF BIRTH:  [birth year]  SUBJECTIVE:  CHIEF COMPLAINT:  Patient's shortness of breath is Better today and patient was placed on high flow oxygen through nasal cannulas, now on 50% of FiO2. He lives on 2 L of oxygen at home for chronic pulmonary fibrosis. The patient was seen by pulmonologist as recommended to continue the same, now on high doses of steroids, admits of minimal cough, no  sputum production. Overall feels better than yesterday, no chest pains.   REVIEW OF SYSTEMS:  CONSTITUTIONAL: No fever, fatigue or weakness.  EYES: No blurred or double vision.  EARS, NOSE, AND THROAT: No tinnitus or ear pain.  RESPIRATORY: Positive cough,Worsening of shortness of breath , denies wheezing or hemoptysis.  CARDIOVASCULAR: No chest pain, orthopnea, edema.  GASTROINTESTINAL: No nausea, vomiting, diarrhea or abdominal pain.  GENITOURINARY: No dysuria, hematuria.  ENDOCRINE: No polyuria, nocturia,  HEMATOLOGY: No anemia, easy bruising or bleeding SKIN: No rash or lesion. MUSCULOSKELETAL: No joint pain or arthritis.   NEUROLOGIC: No tingling, numbness, weakness.  PSYCHIATRY: No anxiety or depression.   DRUG ALLERGIES:   Allergies  Allergen Reactions  . Theophyllines Hives    VITALS:  Blood pressure (!) 154/83, pulse 88, temperature 98.1 F (36.7 C), temperature source Axillary, resp. rate (!) 32, height 5\' 7"  (1.702 m), weight 84.4 kg (186 lb 1.1 oz), SpO2 96 %.  PHYSICAL EXAMINATION:  GENERAL:  83 y.o.-year-old patient lying in the bed In mild respiratory distress, tachypneic, somewhat uncomfortable, using accessory muscles to breathe.  EYES: Pupils equal, round, reactive to light and accommodation. No scleral icterus. Extraocular muscles intact.  HEENT: Head atraumatic, normocephalic. Oropharynx and nasopharynx clear.  NECK:  Supple, no jugular venous distention. No  thyroid enlargement, no tenderness.  LUNGS: Relatively good air entrance bilaterally, diffuse dry crackles, no wheezing,  rales,rhonchi, Using accessory muscles of respiration, tachypneic, but more comfortable than yesterday.  CARDIOVASCULAR: S1, S2 normal. No murmurs, rubs, or gallops.  ABDOMEN: Soft, nontender, nondistended. Bowel sounds present. No organomegaly or mass.  EXTREMITIES: No pedal edema, cyanosis, or clubbing.  NEUROLOGIC: Cranial nerves II through XII are intact. Muscle strength 5/5 in all extremities. Sensation intact. Gait not checked.  PSYCHIATRIC: The patient is alert and oriented x 3.  SKIN: No obvious rash, lesion, or ulcer.    LABORATORY PANEL:   CBC  Recent Labs Lab 08/09/16 0522  WBC 11.6*  HGB 13.0  HCT 38.3*  PLT 150   ------------------------------------------------------------------------------------------------------------------  Chemistries   Recent Labs Lab 08/10/16 0425 08/11/16 0424  NA 145 145  K 4.7 4.5  CL 117* 116*  CO2 23 22  GLUCOSE 300* 297*  BUN 64* 73*  CREATININE 2.01* 1.88*  CALCIUM 8.3* 8.6*  MG 2.6*  --    ------------------------------------------------------------------------------------------------------------------  Cardiac Enzymes  Recent Labs Lab 08/07/16 1609  TROPONINI 0.03*   ------------------------------------------------------------------------------------------------------------------  RADIOLOGY:  No results found.  EKG:   Orders placed or performed during the hospital encounter of 08/06/16  . ED EKG  . ED EKG  . EKG 12-Lead  . EKG 12-Lead    ASSESSMENT AND PLAN:   83 year old elderly male patient with history of cardiomyopathy, chronic kidney disease, hypertension, hyperlipidemia, pulmonary fibrosis, COPD presented to the emergency room with an episode of passing out.  #Acute hypoxic respiratory failure on chronic respiratory failure; secondary to acute bronchitis with superimposed  healthcare associated pneumonia and chronic  pulmonary fibrosis Now off Ventimask, and high flow oxygen nasal cannulas, 50% FiO2, continue high doses of steroids intravenously,Nebulizer treatments, Zosyn ,  get sputum cultures if possible, appreciate pulmonary input. Patient is DO NOT INTUBATE at this time. Blood cultures are negative so far   #.Sepsis- from acute bronchitis with superimposed healthcare associated pneumonia  Patient's antibiotics were changed to IV Zosyn and vancomycin  Vancomycin was discontinued due to MRSA PCR being negative, remains afebrile, unable to get sputum cultures, Blood cultures negative for 5 days Urine culture with no growth Repeat chest x-ray 3/16 with superimposed pneumonia on the right side Leukocytosis is improving. The patient does have difficulty swallowing, coughing with swallowing, diet is downgraded to dysphagia with thickened liquids, appreciate speech therapist input  #. Syncope secondary to Hypotension/vasovagal versus significant hypoxia episode, improved on IV fluids. PT evaluated patient and recommended skilled nursing facility placement for rehabilitation. Acute MI ruled out with negative troponins. Lactic acid is normal  #.  acute on chronic renal failure, known CKD stage III, improved today, continue IV fluids at lower rate, follow creatinine in the morning Baseline creatinine seems to be at 1.87 at baseline, and 1.88 today Creatinine 2.48--2.2 --1.96-1.79- 2.01-1.88. Avoid nephrotoxins check a.m. labs  # .  chronic Pulmonary fibrosis  follow-up with pulmonology  # Diabetes mellitus with significant Hyperglycemia due to steroids, advance insulin Lantus to 29 units, giving additional dose today again, advanced sliding scale insulin dosing, following closely    All the records are reviewed and case discussed with Care Management/Social Workerr. Management plans discussed with the patient, he is in agreement.  CODE STATUS: FC TOTAL CRITICAL  CARE TIME TAKING CARE OF THIS PATIENT: 30 minutes.   POSSIBLE D/C IN 1-2  DAYS, DEPENDING ON CLINICAL CONDITION.  Note: This dictation was prepared with Dragon dictation along with smaller phrase technology. Any transcriptional errors that result from this process are unintentional.   [name] M.D on 08/11/2016 at 3:59 PM  Between 7am to 6pm - Pager - [phone] After 6pm go to [email or web address] - password [email or web address] [facility]  Office  [phone]  CC: Primary care physician; Viviana Simpler, MD

## 2016-08-12 DIAGNOSIS — J9621 Acute and chronic respiratory failure with hypoxia: Secondary | ICD-10-CM

## 2016-08-12 LAB — CBC
HCT: 38.3 % — ABNORMAL LOW (ref 40.0–52.0)
Hemoglobin: 12.9 g/dL — ABNORMAL LOW (ref 13.0–18.0)
MCH: 32.6 pg (ref 26.0–34.0)
MCHC: 33.6 g/dL (ref 32.0–36.0)
MCV: 97.1 fL (ref 80.0–100.0)
PLATELETS: 207 10*3/uL (ref 150–440)
RBC: 3.95 MIL/uL — AB (ref 4.40–5.90)
RDW: 13.9 % (ref 11.5–14.5)
WBC: 15 10*3/uL — AB (ref 3.8–10.6)

## 2016-08-12 LAB — GLUCOSE, CAPILLARY
GLUCOSE-CAPILLARY: 103 mg/dL — AB (ref 65–99)
GLUCOSE-CAPILLARY: 164 mg/dL — AB (ref 65–99)
GLUCOSE-CAPILLARY: 273 mg/dL — AB (ref 65–99)
GLUCOSE-CAPILLARY: 258 mg/dL — AB (ref 65–99)
GLUCOSE-CAPILLARY: 266 mg/dL — AB (ref 65–99)
Glucose-Capillary: 104 mg/dL — ABNORMAL HIGH (ref 65–99)
Glucose-Capillary: 146 mg/dL — ABNORMAL HIGH (ref 65–99)
Glucose-Capillary: 224 mg/dL — ABNORMAL HIGH (ref 65–99)
Glucose-Capillary: 261 mg/dL — ABNORMAL HIGH (ref 65–99)

## 2016-08-12 LAB — CREATININE, SERUM
CREATININE: 1.8 mg/dL — AB (ref 0.61–1.24)
GFR calc non Af Amer: 32 mL/min — ABNORMAL LOW (ref >60)
GFR, EST AFRICAN AMERICAN: 38 mL/min — AB (ref >60)

## 2016-08-12 LAB — PROCALCITONIN: Procalcitonin: 0.17 ng/mL

## 2016-08-12 MED ORDER — FUROSEMIDE 10 MG/ML IJ SOLN
40.0000 mg | Freq: Once | INTRAMUSCULAR | Status: AC
  Administered 2016-08-12: 40 mg via INTRAVENOUS
  Filled 2016-08-12: qty 4

## 2016-08-12 MED ORDER — INSULIN ASPART 100 UNIT/ML ~~LOC~~ SOLN
0.0000 [IU] | Freq: Three times a day (TID) | SUBCUTANEOUS | Status: DC
  Administered 2016-08-12: 5 [IU] via SUBCUTANEOUS
  Administered 2016-08-13: 2 [IU] via SUBCUTANEOUS
  Administered 2016-08-13: 5 [IU] via SUBCUTANEOUS
  Administered 2016-08-13: 1 [IU] via SUBCUTANEOUS
  Administered 2016-08-14: 08:00:00 2 [IU] via SUBCUTANEOUS
  Filled 2016-08-12 (×2): qty 2
  Filled 2016-08-12: qty 5
  Filled 2016-08-12: qty 1
  Filled 2016-08-12: qty 5

## 2016-08-12 MED ORDER — INSULIN ASPART 100 UNIT/ML ~~LOC~~ SOLN
0.0000 [IU] | Freq: Every day | SUBCUTANEOUS | Status: DC
  Administered 2016-08-12: 2 [IU] via SUBCUTANEOUS
  Filled 2016-08-12: qty 5

## 2016-08-12 MED ORDER — INSULIN ASPART 100 UNIT/ML ~~LOC~~ SOLN
2.0000 [IU] | SUBCUTANEOUS | Status: DC
  Administered 2016-08-12 (×2): 6 [IU] via SUBCUTANEOUS
  Administered 2016-08-12: 4 [IU] via SUBCUTANEOUS
  Filled 2016-08-12: qty 4
  Filled 2016-08-12 (×2): qty 6

## 2016-08-12 MED ORDER — BUDESONIDE 0.25 MG/2ML IN SUSP
0.2500 mg | Freq: Two times a day (BID) | RESPIRATORY_TRACT | Status: DC
  Administered 2016-08-12 – 2016-08-13 (×2): 0.25 mg via RESPIRATORY_TRACT
  Filled 2016-08-12 (×2): qty 2

## 2016-08-12 MED ORDER — IPRATROPIUM-ALBUTEROL 0.5-2.5 (3) MG/3ML IN SOLN
3.0000 mL | Freq: Four times a day (QID) | RESPIRATORY_TRACT | Status: DC
  Administered 2016-08-12 – 2016-08-17 (×19): 3 mL via RESPIRATORY_TRACT
  Filled 2016-08-12 (×20): qty 3

## 2016-08-12 MED ORDER — METHYLPREDNISOLONE SODIUM SUCC 40 MG IJ SOLR
40.0000 mg | Freq: Three times a day (TID) | INTRAMUSCULAR | Status: DC
  Administered 2016-08-12 – 2016-08-17 (×15): 40 mg via INTRAVENOUS
  Filled 2016-08-12 (×15): qty 1

## 2016-08-12 MED ORDER — INSULIN ASPART 100 UNIT/ML ~~LOC~~ SOLN
3.0000 [IU] | Freq: Three times a day (TID) | SUBCUTANEOUS | Status: DC
  Administered 2016-08-12 – 2016-08-14 (×5): 3 [IU] via SUBCUTANEOUS
  Filled 2016-08-12 (×5): qty 3

## 2016-08-12 NOTE — Progress Notes (Signed)
Physical Therapy Treatment Patient Details Name: Miguel Huber MRN: [id number] DOB: [birth year] Today's Date: 08/12/2016    History of Present Illness 83 yo male with onset of respiratory failure after admission for syncope.  He was septic, had lung disease, pulm fibrosis, leukocytosis, renal insuff, Elevated BS.  Admitted to ICU and now re-referred to PT for mobility. PMHx:  COPD, DVT, HTN, OA renal insufficiency.    PT Comments    Pt is making good progress towards goals and is off HFNC at this time, only requiring 4L of O2 while resting. Pt agreeable to treatment this date. Pt fatigues quickly with exertion, needs several rest breaks during there-ex. Able to stand at bedside for brief period, however limited by medical status with RR increasing to 40s, HR at 119 and O2 sats decreasing to 69%. Needs cues for pursed lip breathing to improve VS. Pt assisted back supine in bed. Will continue to progress.  Follow Up Recommendations  SNF     Equipment Recommendations  None recommended by PT    Recommendations for Other Services       Precautions / Restrictions Precautions Precautions: Fall Restrictions Weight Bearing Restrictions: No    Mobility  Bed Mobility Overal bed mobility: Needs Assistance Bed Mobility: Supine to Sit     Supine to sit: Min assist     General bed mobility comments: assist for trunk support. Once seated at EOB, pt able to sit with upright posture  Transfers Overall transfer level: Needs assistance Equipment used: Rolling walker (2 wheeled) Transfers: Sit to/from Stand Sit to Stand: Min assist;+2 physical assistance         General transfer comment: able to stand with upright posture. Once standing, pt fatigues quickly, only able to stand for approx 15 seconds, then needs seated rest break. O2 sats decrease to 69% with standing. Sat improves to 84%-89% once supine. HR also increased to 119 with exertion  Ambulation/Gait              General Gait Details: unable to perform   Stairs            Wheelchair Mobility    Modified Rankin (Stroke Patients Only)       Balance Overall balance assessment: Needs assistance Sitting-balance support: Feet supported;Single extremity supported Sitting balance-Leahy Scale: Good     Standing balance support: Bilateral upper extremity supported Standing balance-Leahy Scale: Good                      Cognition Arousal/Alertness: Awake/alert Behavior During Therapy: WFL for tasks assessed/performed Overall Cognitive Status: Within Functional Limits for tasks assessed                      Exercises Other Exercises Other Exercises: Supine ther-ex performed including B LE heel slides, SLRs, ankle pumps, and quad sets. All ther-ex performed x 10 reps with min assist. Pt needs several rest breaks during ther-ex.    General Comments        Pertinent Vitals/Pain Pain Assessment: No/denies pain    Home Living                      Prior Function            PT Goals (current goals can now be found in the care plan section) Acute Rehab PT Goals Patient Stated Goal: to try to get moving again PT Goal Formulation: With patient Time For Goal Achievement: 08/23/16  Potential to Achieve Goals: Good Progress towards PT goals: Progressing toward goals    Frequency    Min 2X/week      PT Plan Current plan remains appropriate    Co-evaluation             End of Session Equipment Utilized During Treatment: Gait belt;Oxygen Activity Tolerance: Treatment limited secondary to medical complications (Comment) Patient left: in bed;with call bell/phone within reach;with bed alarm set Nurse Communication: Mobility status PT Visit Diagnosis: Difficulty in walking, not elsewhere classified (R26.2);Muscle weakness (generalized) (M62.81)     Time: 1561-5379 PT Time Calculation (min) (ACUTE ONLY): 24 min  Charges:  $Therapeutic Exercise:  8-22 mins $Therapeutic Activity: 8-22 mins                    G Codes:       Linkon Siverson 08-30-2016, 4:35 PM [name], PT, DPT [phone]

## 2016-08-12 NOTE — Progress Notes (Signed)
Inpatient Diabetes Program Recommendations  AACE/ADA: New Consensus Statement on Inpatient Glycemic Control (2015)  Target Ranges:  Prepandial:   less than 140 mg/dL      Peak postprandial:   less than 180 mg/dL (1-2 hours)      Critically ill patients:  140 - 180 mg/dL   Lab Results  Component Value Date   GLUCAP 261 (H) 08/12/2016   HGBA1C 7.3 (H) 08/09/2016    Review of Glycemic Control  Results for ENNIO, HOUP (MRN [id number]) as of 08/12/2016 11:21  Ref. Range 08/12/2016 00:05 08/12/2016 01:05 08/12/2016 04:12 08/12/2016 08:01 08/12/2016 10:04  Glucose-Capillary Latest Ref Range: 65 - 99 mg/dL 104 (H) 103 (H) 164 (H) 146 (H) 261 (H)    Diabetes history:DM2 Outpatient Diabetes medications: NPH 25 units BID, Regular 10 units BID  Current orders for Inpatient glycemic control: increased to Lantus 29 units daily, Novolog 2-6 units q4h  Inpatient Diabetes Program Recommendations: Noted that patient Lantus was increased today, to Lantus 29 units. He likely has elevated CBG this am, because patient was given basal insulin well before the insulin drip was discontinued and the dose was likely insufficient for his needs.   Novolog correction was not given until 10:54am. This is too close to 12 noon dose of Novolog insulin- spoke to [employer]- recommend getting an order from the MD to hold correction Novolog insulin at 12 noon.   * Please note- This order for Novolog insulin is given independently from meals and should always be given at the hour it is ordered.  It does not correlate with the meal tray.   If the patient is eating, it may be more appropriate to change Novolog to 3 units tid and add Novolog 0-9 units tid and Novolog 0-5 units qhs.   Gentry Fitz, RN, BA, MHA, CDE Diabetes Coordinator Inpatient Diabetes Program  [phone] (Team Pager) [phone] ([place]) 08/12/2016 12:34 PM

## 2016-08-12 NOTE — Progress Notes (Signed)
Speech Language Pathology Treatment: Dysphagia  Patient Details Name: Miguel Huber MRN: [id number] DOB: 22-Aug-1927 Today's Date: 08/12/2016 Time: 0935-1010 SLP Time Calculation (min) (ACUTE ONLY): 35 min  Assessment / Plan / Recommendation Clinical Impression  Pt seen for toleration of current diet and trials to upgrade diet consistency (liquids). Pt continues on HFNC for O2 support and presents w/ quick increased RR w/ any exertion including talking. Pt was instructed on calming his breathing using easy breathing techniques and given time, pt calmed his breathing from the lower 30's to the lower 20's. Pt then consumed trials of thin liquids via cup and straw w/ no immediate, overt s/s of aspiration noted - clear vocal quality b/t trials. However, pt's RR increased w/ the exertion to the lower 30's consistently requiring pt to take Rest Breaks for easy breathing and calming his RR b/t trials. This presentation can increase risk for aspiration. No oral phase deficits noted w/ trials given.  Pt followed SLP's instructions and recommendations for aspiration precautions; easy breathing techniques w/ verbal and visual cues. Discussed need to continue w/ pureed diet consistency d/t edentulous status and to lessen exertion d/t respiratory status at this time; food options. Education given on aspiration precautions. Recommend continue w/ purees(foods) but upgrade diet to thin liquids w/ aspiration precautions. ST will f/u w/ toleration of diet and trials to upgrade when appropriate. NSG updated.     HPI HPI: Pt is a 83 y.o. male with a known history of Pulmonary fibrosis, type 2 diabetes mellitus, COPD, cardiomyopathy, erectile dysfunction, GERD, hypertension, hyperlipidemia, chronic kidney disease presented to the emergency room after he passed out. Patient was going to the bathroom when he passed out. Patient lives with his son at home. She uses home oxygen. He ambulates with the help of a walker at  home. Patient was evaluated in the emergency room his WBC count was elevated and blood pressure was on the lower side. Code sepsis was called and patient received IV fluid bolus and broad-spectrum IV antibiotics. Chest x-ray showed interstitial lung disease with infiltrates. Patient not on any oral steroids as outpatient. Has some chills but no fever. Has generalized weakness. No history of any head injury. Has shortness of breath on exertion and occasional cough. Pt has been tolerating his current pureed diet w/ Honey consistency liquids since BSE on 08/09/16. Pt continues to have increased WOB/SOB w/ increased exertion of eating, talking, moving about in bed. Pt appears min HOH.       SLP Plan  Continue with current plan of care       Recommendations  Diet recommendations: Dysphagia 1 (puree);Thin liquid Liquids provided via: Cup;Straw (monitor straw use) Medication Administration: Whole meds with puree (but Crushed in puree if easier for swallowing) Supervision: Patient able to self feed;Staff to assist with self feeding;Intermittent supervision to cue for compensatory strategies Compensations: Minimize environmental distractions;Slow rate;Small sips/bites;Lingual sweep for clearance of pocketing;Follow solids with liquid (frequent rest breaks to avoid WOB/SOB) Postural Changes and/or Swallow Maneuvers: Seated upright 90 degrees;Upright 30-60 min after meal                General recommendations:  (Dietician f/u for nutritional support) Oral Care Recommendations: Oral care BID;Staff/trained caregiver to provide oral care Follow up Recommendations: Skilled Nursing facility (TBD) SLP Visit Diagnosis: Dysphagia, oropharyngeal phase (R13.12) Plan: Continue with current plan of care       [place]  Miguel Huber, [place], CCC-SLP Miguel Huber 08/12/2016, 1:29 PM

## 2016-08-12 NOTE — Progress Notes (Signed)
[facility] NAME: Miguel Huber    MR#:  [id number]  DATE OF BIRTH:  05/15/1928  SUBJECTIVE:  CHIEF COMPLAINT:  Patient's shortness of breath is Better today and patient was placed on high flow oxygen through nasal cannulas, now on 50% of FiO2. He lives on 2 L of oxygen at home for chronic pulmonary fibrosis. The patient was seen by pulmonologist as recommended to continue the same, now on Decreased  doses of steroids, due to hyperglycemia, admits of cough, no significant sputum production. Overall feels poorly, very short of breath today. Feels some bloating in his abdomen, not willing to eat much. Now on 65% FiO2 via high flow nasal cannulas  REVIEW OF SYSTEMS:  CONSTITUTIONAL: No fever, fatigue or weakness.  EYES: No blurred or double vision.  EARS, NOSE, AND THROAT: No tinnitus or ear pain.  RESPIRATORY: Positive cough,Worsening of shortness of breath Today, denies wheezing or hemoptysis.  CARDIOVASCULAR: No chest pain, orthopnea, edema.  GASTROINTESTINAL: No nausea, vomiting, diarrhea or abdominal pain.  GENITOURINARY: No dysuria, hematuria.  ENDOCRINE: No polyuria, nocturia,  HEMATOLOGY: No anemia, easy bruising or bleeding SKIN: No rash or lesion. MUSCULOSKELETAL: No joint pain or arthritis.   NEUROLOGIC: No tingling, numbness, weakness.  PSYCHIATRY: No anxiety or depression.   DRUG ALLERGIES:   Allergies  Allergen Reactions  . Theophyllines Hives    VITALS:  Blood pressure 121/72, pulse 67, temperature 98.7 F (37.1 C), resp. rate (!) 30, height 5\' 7"  (1.702 m), weight 84.4 kg (186 lb 1.1 oz), SpO2 99 %.  PHYSICAL EXAMINATION:  GENERAL:  83 y.o.-year-old patient lying in the bed InModerate to severey distress, tachypneic, uncomfortable, using accessory muscles to breathe, Intermittently gasping for air.  EYES: Pupils equal, round, reactive to light and accommodation. No scleral icterus. Extraocular muscles  intact.  HEENT: Head atraumatic, normocephalic. Oropharynx and nasopharynx clear.  NECK:  Supple, no jugular venous distention. No thyroid enlargement, no tenderness.  LUNGS: Relatively good air entrance bilaterally, , no wheezing,  rales,rhonchi, Using accessory muscles of respiration, tachypneic, uncomfortable.  CARDIOVASCULAR: S1, S2 normal. No murmurs, rubs, or gallops.  ABDOMEN: Soft, nontender, nondistended. Bowel sounds present. No organomegaly or mass.  EXTREMITIES: No pedal edema, cyanosis, or clubbing.  NEUROLOGIC: Cranial nerves II through XII are intact. Muscle strength 5/5 in all extremities. Sensation intact. Gait not checked.  PSYCHIATRIC: The patient is alert and oriented x 3.  SKIN: No obvious rash, lesion, or ulcer.    LABORATORY PANEL:   CBC  Recent Labs Lab 08/12/16 0610  WBC 15.0*  HGB 12.9*  HCT 38.3*  PLT 207   ------------------------------------------------------------------------------------------------------------------  Chemistries   Recent Labs Lab 08/10/16 0425 08/11/16 0424 08/12/16 0610  NA 145 145  --   K 4.7 4.5  --   CL 117* 116*  --   CO2 23 22  --   GLUCOSE 300* 297*  --   BUN 64* 73*  --   CREATININE 2.01* 1.88* 1.80*  CALCIUM 8.3* 8.6*  --   MG 2.6*  --   --    ------------------------------------------------------------------------------------------------------------------  Cardiac Enzymes  Recent Labs Lab 08/07/16 1609  TROPONINI 0.03*   ------------------------------------------------------------------------------------------------------------------  RADIOLOGY:  No results found.  EKG:   Orders placed or performed during the hospital encounter of 08/06/16  . ED EKG  . ED EKG  . EKG 12-Lead  . EKG 12-Lead    ASSESSMENT AND PLAN:   83 year old elderly male patient with history  of cardiomyopathy, chronic kidney disease, hypertension, hyperlipidemia, pulmonary fibrosis, COPD presented to the emergency room with  an episode of passing out.  #Acute hypoxic respiratory failure on chronic respiratory failure; secondary to acute bronchitis with superimposed healthcare associated pneumonia and chronic pulmonary fibrosis Now on high flow oxygen nasal cannulas, 65% FiO2, continue  steroids intravenously, Nebulizer treatments, Zosyn ,  get sputum cultures if possible, appreciate pulmonary input. Patient is DO NOT INTUBATE at this time. Blood cultures are negative so far. Discontinue IV fluids, administer 1 dose of Lasix intravenously, follow ins and outs, oxygenation, administer Lasix intermittently as needed.    #.Sepsis- from acute bronchitis with superimposed healthcare associated pneumonia  Patient's antibiotics were changed to IV Zosyn and vancomycin  Vancomycin was discontinued due to MRSA PCR being negative, remains afebrile, unable to get sputum cultures, Blood cultures negative for 5 days Urine culture with no growth Repeat chest x-ray 3/16 with superimposed pneumonia on the right side Leukocytosis is improving. The patient does have difficulty swallowing, coughing with swallowing, diet is downgraded to dysphagia with thickened liquids, appreciate speech therapist input  #. Syncope secondary to Hypotension/vasovagal versus hypoxia episode, improved on IV fluids. PT evaluated patient and recommended skilled nursing facility placement for rehabilitation. Acute MI ruled out with negative troponins. Lactic acid is normal  #.  acute on chronic renal failure, known CKD stage III, improved overall, , discontinue IV fluids , creatinine is lower than baseline  Baseline creatinine seems to be at 1.87 at baseline, and 1.80 today Creatinine 2.48--2.2 --1.96-1.79- 2.01-1.88-1.8. Avoid nephrotoxins check a.m.labs after the diuresis  # .  chronic Pulmonary fibrosis  follow-up with pulmonology  # Diabetes mellitus with significant Hyperglycemia due to steroids,  follow blood glucose levels on advanced insulin Lantus  Doses   All the records are reviewed and case discussed with Care Management/Social Workerr. Management plans discussed with the patient, he is in agreement.  CODE STATUS: FC TOTAL CRITICAL CARE TIME TAKING CARE OF THIS PATIENT: 75minutes.   POSSIBLE D/C IN 1-2  DAYS, DEPENDING ON CLINICAL CONDITION.  Note: This dictation was prepared with Dragon dictation along with smaller phrase technology. Any transcriptional errors that result from this process are unintentional.   Tonie Vizcarrondo M.D on 08/12/2016 at 5:37 PM  Between 7am to 6pm - Pager - [phone] After 6pm go to [email or web address] - password [email or web address] [facility]  Office  [phone]  CC: Primary care physician; Viviana Simpler, MD

## 2016-08-12 NOTE — Progress Notes (Signed)
[facility] Progess Note    ASSESSMENT/PLAN   83 yo male who is DNI with h/o pulmonary fibrosis, cardiomyopathy, COPD presented with syncope. Possible pneumonia with sepsis and AKI.   PULMONARY A: Pulmonary fibrosis.  Currently on high flow at 50% DNI.  P:   Wean down oxygen as tolerated.  Continue steroids, dose decreased due to hyperglycemia.   CARDIOVASCULAR A: Essential hypertension.  P:  Will monitor and treat as needed. Takes lisinopril at home, can restart as renal function improves.   RENAL A:  Creatinine improving. Urine output adequate.  P:   Will continue to monitor.   GASTROINTESTINAL A:  -- P:   --  HEMATOLOGIC A:  -- P:  --  INFECTIOUS A:  Continue abx empirically for possible pneumonia.  P:    Micro/culture results:  BCx2 3/14; negative UC  3/14; negative.  Sputum -- MRSA PCR 3/16; negative.   Antibiotics: Zosyn 3/16>>  ENDOCRINE A:  DM. Glucose mildly elevated today, will decrease steroids.  P:   Continue SSI.  Continue lantus 22 units qday.   NEUROLOGIC A:  --  MAJOR EVENTS/TEST RESULTS:   Best Practices  DVT Prophylaxis:heparin SQ GI Prophylaxis: protonix.    ---------------------------------------   ----------------------------------------   Name: Miguel Huber MRN: [id number] DOB: November 27, 1927    ADMISSION DATE:  08/06/2016   SUBJECTIVE:   Pt currently has reduced mental status and can not provide history or review of systems.   Review of Systems:  --  VITAL SIGNS: Temp:  [97.6 F (36.4 C)-98.7 F (37.1 C)] 98.7 F (37.1 C) (03/20 0400) Pulse Rate:  [67-106] 67 (03/20 0600) Resp:  [18-32] 30 (03/20 0600) BP: (115-154)/(58-97) 121/72 (03/20 0600) SpO2:  [76 %-99 %] 99 % (03/20 0600) FiO2 (%):  [43 %-65 %] 43 % (03/20 1126) HEMODYNAMICS:   VENTILATOR SETTINGS: FiO2 (%):  [43 %-65 %] 43 % INTAKE / OUTPUT:  Intake/Output Summary (Last 24 hours) at 08/12/16 1148 Last data  filed at 08/12/16 1024  Gross per 24 hour  Intake          1321.37 ml  Output             1400 ml  Net           -78.63 ml    PHYSICAL EXAMINATION: Physical Examination:   VS: BP 121/72   Pulse 67   Temp 98.7 F (37.1 C)   Resp (!) 30   Ht 5\' 7"  (1.702 m)   Wt 186 lb 1.1 oz (84.4 kg)   SpO2 99%   BMI 29.14 kg/m   General Appearance: No distress  Neuro:without focal findings, mental status reduced.  HEENT: PERRLA, EOM intact. Pulmonary: diffuse bilateral crackles decrease air entry bilaterally.  CardiovascularNormal S1,S2.  No m/r/g.   Abdomen: Benign, Soft, non-tender. Renal:  No costovertebral tenderness  GU:  Not performed at this time. Endocrine: No evident thyromegaly. Skin:   warm, no rashes, no ecchymosis  Extremities: normal, no cyanosis, clubbing.   LABS:   LABORATORY PANEL:   CBC  Recent Labs Lab 08/12/16 0610  WBC 15.0*  HGB 12.9*  HCT 38.3*  PLT 207    Chemistries   Recent Labs Lab 08/10/16 0425 08/11/16 0424 08/12/16 0610  NA 145 145  --   K 4.7 4.5  --   CL 117* 116*  --   CO2 23 22  --   GLUCOSE 300* 297*  --   BUN 64* 73*  --  CREATININE 2.01* 1.88* 1.80*  CALCIUM 8.3* 8.6*  --   MG 2.6*  --   --   PHOS 3.1  --   --      Recent Labs Lab 08/11/16 2300 08/12/16 0005 08/12/16 0105 08/12/16 0412 08/12/16 0801 08/12/16 1004  GLUCAP 133* 104* 103* 164* 146* 261*   No results for input(s): PHART, PCO2ART, PO2ART in the last 168 hours. No results for input(s): AST, ALT, ALKPHOS, BILITOT, ALBUMIN in the last 168 hours.  Cardiac Enzymes  Recent Labs Lab 08/07/16 1609  TROPONINI 0.03*    RADIOLOGY:  No results found.     Marda Stalker, MD.  ICU Pager: [phone] [facility] Office Number: [phone]   08/12/2016   Critical Care Attestation.  I have personally obtained a history, examined the patient, evaluated laboratory and imaging results, formulated the assessment and  plan and placed orders. The Patient requires high complexity decision making for assessment and support, frequent evaluation and titration of therapies, application of advanced monitoring technologies and extensive interpretation of multiple databases. The patient has critical illness that could lead imminently to failure of 1 or more organ systems and requires the highest level of physician preparedness to intervene.  Critical Care Time devoted to patient care services described in this note is 31 minutes and is exclusive of time spent in procedures.

## 2016-08-13 ENCOUNTER — Inpatient Hospital Stay: Payer: Medicare Other

## 2016-08-13 DIAGNOSIS — Z515 Encounter for palliative care: Secondary | ICD-10-CM

## 2016-08-13 DIAGNOSIS — R0609 Other forms of dyspnea: Secondary | ICD-10-CM

## 2016-08-13 DIAGNOSIS — J189 Pneumonia, unspecified organism: Secondary | ICD-10-CM

## 2016-08-13 DIAGNOSIS — R0603 Acute respiratory distress: Secondary | ICD-10-CM

## 2016-08-13 DIAGNOSIS — R0600 Dyspnea, unspecified: Secondary | ICD-10-CM

## 2016-08-13 DIAGNOSIS — R05 Cough: Secondary | ICD-10-CM

## 2016-08-13 DIAGNOSIS — Z66 Do not resuscitate: Secondary | ICD-10-CM

## 2016-08-13 LAB — CBC
HCT: 42.5 % (ref 40.0–52.0)
HEMOGLOBIN: 14.2 g/dL (ref 13.0–18.0)
MCH: 32 pg (ref 26.0–34.0)
MCHC: 33.5 g/dL (ref 32.0–36.0)
MCV: 95.6 fL (ref 80.0–100.0)
Platelets: 215 10*3/uL (ref 150–440)
RBC: 4.44 MIL/uL (ref 4.40–5.90)
RDW: 13.8 % (ref 11.5–14.5)
WBC: 18.5 10*3/uL — ABNORMAL HIGH (ref 3.8–10.6)

## 2016-08-13 LAB — BASIC METABOLIC PANEL
Anion gap: 9 (ref 5–15)
BUN: 61 mg/dL — AB (ref 6–20)
CALCIUM: 8.5 mg/dL — AB (ref 8.9–10.3)
CHLORIDE: 114 mmol/L — AB (ref 101–111)
CO2: 22 mmol/L (ref 22–32)
CREATININE: 2.04 mg/dL — AB (ref 0.61–1.24)
GFR calc non Af Amer: 28 mL/min — ABNORMAL LOW (ref >60)
GFR, EST AFRICAN AMERICAN: 32 mL/min — AB (ref >60)
Glucose, Bld: 160 mg/dL — ABNORMAL HIGH (ref 65–99)
Potassium: 4.8 mmol/L (ref 3.5–5.1)
Sodium: 145 mmol/L (ref 135–145)

## 2016-08-13 LAB — GLUCOSE, CAPILLARY
GLUCOSE-CAPILLARY: 150 mg/dL — AB (ref 65–99)
GLUCOSE-CAPILLARY: 185 mg/dL — AB (ref 65–99)
Glucose-Capillary: 292 mg/dL — ABNORMAL HIGH (ref 65–99)
Glucose-Capillary: 148 mg/dL — ABNORMAL HIGH (ref 65–99)
Glucose-Capillary: 159 mg/dL — ABNORMAL HIGH (ref 65–99)

## 2016-08-13 LAB — MAGNESIUM: MAGNESIUM: 2.8 mg/dL — AB (ref 1.7–2.4)

## 2016-08-13 MED ORDER — MORPHINE SULFATE (CONCENTRATE) 10 MG/0.5ML PO SOLN
ORAL | Status: AC
  Administered 2016-08-13: 5 mg via ORAL
  Filled 2016-08-13: qty 1

## 2016-08-13 MED ORDER — FUROSEMIDE 10 MG/ML IJ SOLN
40.0000 mg | Freq: Once | INTRAMUSCULAR | Status: AC
  Administered 2016-08-13: 40 mg via INTRAVENOUS
  Filled 2016-08-13: qty 4

## 2016-08-13 MED ORDER — MORPHINE SULFATE (CONCENTRATE) 10 MG/0.5ML PO SOLN
5.0000 mg | ORAL | Status: DC | PRN
  Administered 2016-08-13 – 2016-08-14 (×3): 5 mg via ORAL
  Filled 2016-08-13 (×2): qty 1

## 2016-08-13 MED ORDER — GUAIFENESIN ER 600 MG PO TB12
600.0000 mg | ORAL_TABLET | Freq: Two times a day (BID) | ORAL | Status: DC
  Administered 2016-08-13 – 2016-08-17 (×9): 600 mg via ORAL
  Filled 2016-08-13 (×9): qty 1

## 2016-08-13 NOTE — Consult Note (Signed)
Consultation Note Date: 08/13/2016   Patient Name: Miguel Huber  DOB: 21-Feb-1928  MRN: [id number]  Age / Sex: 83 y.o., male  PCP: Venia Carbon, MD Referring Physician: Theodoro Grist, MD  Reason for Consultation: Establishing goals of care and Psychosocial/spiritual support  HPI/Patient Profile: 83 y.o. male  admitted on 08/06/2016 with a known history of Pulmonary fibrosis, type 2 diabetes mellitus, COPD, cardiomyopathy, erectile dysfunction, GERD, hypertension, hyperlipidemia, chronic kidney disease presented to the emergency room after he passed out.  He uses home oxygen. He ambulates with the help of a walker at home.   Patient lives with his son at home. ( today patient and his daughter Helene Kelp tell mer that Quita Skye the son who lives at home with this patient "is not helpful and is an alcoholic"   Patient was evaluated in the emergency room his WBC count was elevated and blood pressure was on the lower side. Code sepsis was called and patient received IV fluid bolus and broad-spectrum IV antibiotics.   Chest x-ray showed interstitial lung disease with infiltrates.    Admitted for further evaluation and treatment.  Acute hypoxic respiratory failure on chronic respiratory failure; secondary to acute bronchitis with superimposed healthcare associated pneumonia and chronic pulmonary fibrosis  Currently on HFNC and at this point has not been able to wean off.     Patient and family face treatment option and advanced directive decisions, and anticipatory care needs.   Clinical Assessment and Goals of Care:  This NP Wadie Lessen reviewed medical records, received report from team, assessed the patient and then meet at the patient's bedside along with his daughter/Teresa and grand daughter/Heather  to discuss diagnosis, prognosis, GOC, EOL wishes disposition and options.  A detailed discussion was had today  regarding advanced directives.  Concepts specific to code status, artifical feeding and hydration, continued IV antibiotics and rehospitalization was had.  The difference between a aggressive medical intervention path  and a palliative comfort care path for this patient at this time was had.  Values and goals of care important to patient and family were attempted to be elicited.  Today's conversation had focus on attempts to wean off high flow nasal cannula and not escalate in the future.  Paient agrees.  He verbalizes desires for DNR/DNI status  Concept of Hospice and Palliative Care were discussed  Natural trajectory and expectations at EOL were discussed.  Questions and concerns addressed.   Family encouraged to call with questions or concerns.  PMT will continue to support holistically.   PATIENT is his own decision maker at this time but tells me that if he cannot speak for himself he trusts his daughter Nira Conn to make decisions for him.    SUMMARY OF RECOMMENDATIONS   - goal is to successfully wean off HFNC to a place of comfort - patient understands his poor prognosis and main focus of care is comfort - depending on outcomes patient would be open to to a hospice facility   Code Status/Advance Care Planning:  DNR-documented  today   Symptom Management:   Dyspnea: Roxanol 5 mg sl/po every 3 hrs prn  Maximize medical management of chronic disease  Palliative Prophylaxis:   Aspiration, Bowel Regimen, Delirium Protocol, Frequent Pain Assessment and Oral Care   Psycho-social/Spiritual:   Desire for further Chaplaincy support:yes  Additional Recommendations: Education on Hospice  Prognosis:   < 6 months, may be much less depending on outcomes of this hospitalization  Discharge Planning: To Be Determined      Primary Diagnoses: Present on Admission: . Syncope   I have reviewed the medical record, interviewed the patient and family, and examined the patient. The  following aspects are pertinent.  Past Medical History:  Diagnosis Date  . Allergic rhinitis   . Cardiomyopathy   . COPD (chronic obstructive pulmonary disease) ([place])   . Diabetes mellitus   . DVT (deep venous thrombosis), right 5/13  . Erectile dysfunction   . GERD (gastroesophageal reflux disease)   . Hx of adenomatous colonic polyps   . Hyperlipidemia   . Hypertension   . Osteoarthritis   . Pulmonary fibrosis ([place])   . Renal insufficiency    Social History   Social History  . Marital status: Widowed    Spouse name: N/A  . Number of children: 3  . Years of education: N/A   Occupational History  . retired     IT consultant   Social History Main Topics  . Smoking status: Former Research scientist (life sciences)  . Smokeless tobacco: Never Used  . Alcohol use No  . Drug use: No  . Sexual activity: Not Asked   Other Topics Concern  . None   Social History Narrative   Has platonic lady lfriend since ~2008  [facility]. Currently in nursing home after stroke.      Has living will now   No health care POA--has 3 children   Would like to try resuscitation--would accept ventilator only temporarily   Might accept tube feeds if cognitively unaware   Family History  Problem Relation Age of Onset  . Cancer Neg Hx   . Diabetes Neg Hx    Scheduled Meds: . aspirin EC  81 mg Oral Daily  . chlorhexidine  15 mL Mouth Rinse BID  . cholecalciferol  1,000 Units Oral Daily  . guaiFENesin  600 mg Oral BID  . heparin  5,000 Units Subcutaneous Q8H  . insulin aspart  0-5 Units Subcutaneous QHS  . insulin aspart  0-9 Units Subcutaneous TID WC  . insulin aspart  3 Units Subcutaneous TID WC  . insulin glargine  29 Units Subcutaneous Daily  . ipratropium-albuterol  3 mL Nebulization Q6H  . lidocaine  1 patch Transdermal Q24H  . mouth rinse  15 mL Mouth Rinse q12n4p  . methylPREDNISolone (SOLU-MEDROL) injection  40 mg Intravenous Q8H  . pantoprazole  40 mg Oral Daily  . piperacillin-tazobactam  3.375 g  Intravenous Q8H  . rosuvastatin  20 mg Oral QHS   Continuous Infusions: PRN Meds:.acetaminophen **OR** acetaminophen, ipratropium-albuterol, lidocaine-prilocaine, menthol-cetylpyridinium, ondansetron **OR** ondansetron (ZOFRAN) IV, oxyCODONE-acetaminophen, senna-docusate, temazepam, trolamine salicylate Medications Prior to Admission:  Prior to Admission medications   Medication Sig Start Date End Date Taking? Authorizing Provider  aspirin EC 81 MG tablet Take 81 mg by mouth daily.   Yes Historical Provider, MD  cholecalciferol (VITAMIN D) 1000 units tablet Take 1,000 Units by mouth daily.   Yes Historical Provider, MD  furosemide (LASIX) 80 MG tablet TAKE 1 TABLET BY MOUTH EVERY DAY 04/03/16  Yes Richard  Felipa Furnace, MD  glucose blood (PRODIGY NO CODING BLOOD GLUC) test strip Check blood sugar twice a day and as directed. Dx E11.29. 06/17/16  Yes Venia Carbon, MD  Insulin Syringe-Needle U-100 (B-D INS SYRINGE 0.5CC/30GX1/2") 30G X 1/2" 0.5 ML MISC Use as directed to inject insulin dx:E11.29 04/30/16  Yes Venia Carbon, MD  ipratropium-albuterol (DUONEB) 0.5-2.5 (3) MG/3ML SOLN Take 3 mLs by nebulization every 6 (six) hours as needed (for wheezing/shortness of breath).    Yes Historical Provider, MD  lisinopril (PRINIVIL,ZESTRIL) 10 MG tablet Take 10 mg by mouth daily.   Yes Historical Provider, MD  NOVOLIN N 100 UNIT/ML injection INJECT 0.25 MLS (25 UNITS TOTAL) INTO THE SKIN 2 (TWO) TIMES DAILY. 04/07/16  Yes Venia Carbon, MD  NOVOLIN R 100 UNIT/ML injection INJECT 0.1 MLS (10 UNITS TOTAL) INTO THE SKIN 2 (TWO) TIMES DAILY BEFORE A MEAL. DX: E11.40 06/23/16  Yes Venia Carbon, MD  omeprazole (PRILOSEC) 20 MG capsule Take 1 capsule (20 mg total) by mouth daily. 11/29/15  Yes Venia Carbon, MD  rosuvastatin (CRESTOR) 20 MG tablet Take 20 mg by mouth at bedtime.   Yes Historical Provider, MD  temazepam (RESTORIL) 30 MG capsule Take 30 mg by mouth at bedtime as needed for sleep.   Yes  Historical Provider, MD   Allergies  Allergen Reactions  . Theophyllines Hives   Review of Systems  Constitutional: Positive for fatigue.  Respiratory: Positive for cough and shortness of breath.   Neurological: Positive for weakness.    Physical Exam  Constitutional: He is oriented to person, place, and time. He appears cachectic. He appears ill.  Cardiovascular: Normal rate, regular rhythm and normal heart sounds.   Pulmonary/Chest: Tachypnea noted. He has decreased breath sounds. He has rhonchi.  Neurological: He is alert and oriented to person, place, and time.  Skin: Skin is warm and dry.    Vital Signs: BP 136/76 (BP Location: Right Arm)   Pulse 75   Temp 98.6 F (37 C) (Axillary)   Resp (!) 31   Ht 5\' 7"  (1.702 m)   Wt 84.4 kg (186 lb 1.1 oz)   SpO2 94%   BMI 29.14 kg/m  Pain Assessment: CPOT POSS *See Group Information*: 1-Acceptable,Awake and alert Pain Score: 0-No pain   SpO2: SpO2: 94 % O2 Device:SpO2: 94 % O2 Flow Rate: .O2 Flow Rate (L/min): 40 L/min  IO: Intake/output summary:  Intake/Output Summary (Last 24 hours) at 08/13/16 1344 Last data filed at 08/13/16 1200  Gross per 24 hour  Intake              622 ml  Output             3450 ml  Net            -2828 ml    LBM: Last BM Date: 08/12/16 Baseline Weight: Weight: 84.4 kg (186 lb) Most recent weight: Weight: 84.4 kg (186 lb 1.1 oz)      Palliative Assessment/Data: 30 % at best   Discussed with Dr Ether Griffins and Dr Marya Landry  Time In: 1400 Time Out: 1515 Time Total: 75 min Greater than 50%  of this time was spent counseling and coordinating care related to the above assessment and plan.  Signed by: Wadie Lessen, NP   Please contact Palliative Medicine Team phone at [phone] for questions and concerns.  For individual provider: See Shea Evans

## 2016-08-13 NOTE — Progress Notes (Addendum)
Speech Language Pathology Treatment: Dysphagia  Patient Details Name: Miguel Huber MRN: [id number] DOB: 12-31-1927 Today's Date: 08/13/2016 Time: 1062-6948 SLP Time Calculation (min) (ACUTE ONLY): 45 min  Assessment / Plan / Recommendation Clinical Impression  Pt seen for toleration of diet, thin liquids. Pt continues on HFNC for O2 support and presents w/ quick increased RR w/ any exertion including talking, eating/drinking. Pt was instructed on calming his breathing using easy breathing techniques, and given time, pt calmed his breathing. Pt consumed trials of thin liquids via cup and straw w/ no immediate, overt s/s of aspiration noted - clear vocal quality b/t trials. However, pt's RR increased w/ the exertion to the lower 30's consistently requiring pt to take Rest Breaks for easy breathing and calming his RR b/t trials. This presentation can increase risk for aspiration. Also noted mild hack cough x1 delayed post several po trials - unsure if related to the po trials. No oral phase deficits noted w/ trials given.  Pt followed SLP's instructions and recommendations for aspiration precautions; easy breathing techniques w/ verbal and visual cues. Discussed need to continue w/ pureed diet consistency d/t edentulous status and to lessen exertion d/t respiratory status at this time; food options. Education given on aspiration precautions. Recommend continue w/ purees(foods) w/ thin liquids w/ strict aspiration precautions including no straws if indicated. ST will f/u w/ toleration of diet. Possible need for f/u w/ objective swallow assessment (MBSS) for further assessment of swallowing is aspiration(silent) is suspected. NSG updated.    HPI HPI: Pt is a 83 y.o. male with a known history of Pulmonary fibrosis, type 2 diabetes mellitus, COPD, cardiomyopathy, erectile dysfunction, GERD, hypertension, hyperlipidemia, chronic kidney disease presented to the emergency room after he passed out. Patient  was going to the bathroom when he passed out. Patient lives with his son at home. She uses home oxygen. He ambulates with the help of a walker at home. Patient was evaluated in the emergency room his WBC count was elevated and blood pressure was on the lower side. Code sepsis was called and patient received IV fluid bolus and broad-spectrum IV antibiotics. Chest x-ray showed interstitial lung disease with infiltrates. Patient not on any oral steroids as outpatient. Has some chills but no fever. Has generalized weakness. No history of any head injury. Has shortness of breath on exertion and occasional cough. Pt has been tolerating his current pureed diet w/ Honey consistency liquids since BSE on 08/09/16. Pt continues to have increased WOB/SOB w/ increased exertion of eating, talking, moving about in bed. Pt appears min HOH.       SLP Plan  Continue with current plan of care       Recommendations  Diet recommendations: Dysphagia 1 (puree);Thin liquid Liquids provided via: Cup;Straw (monitor straw use for any s/s of aspiration) Medication Administration: Whole meds with puree (Crushed if needed for easier, safer swallowing) Supervision: Patient able to self feed;Staff to assist with self feeding;Intermittent supervision to cue for compensatory strategies Compensations: Minimize environmental distractions;Slow rate;Small sips/bites;Lingual sweep for clearance of pocketing;Multiple dry swallows after each bite/sip;Follow solids with liquid (Rest Breaks often to lessen WOB/SOB) Postural Changes and/or Swallow Maneuvers: Seated upright 90 degrees;Upright 30-60 min after meal                General recommendations:  (Dietician f/u as needed) Oral Care Recommendations: Oral care BID;Staff/trained caregiver to provide oral care Follow up Recommendations: Skilled Nursing facility (TBD) SLP Visit Diagnosis: Dysphagia, oropharyngeal phase (R13.12) Plan: Continue with current  plan of care        [place], [place], CCC-SLP Watson,Katherine 08/13/2016, 2:57 PM

## 2016-08-13 NOTE — Progress Notes (Signed)
Pharmacy Antibiotic Follow-up Note  Miguel Huber is a 83 y.o. year-old male admitted on 08/06/2016.  The patient is currently on day 6 of Zosyn for possible PNA (day 7 of abx).  Assessment/Plan: After discussion with Dr. Ashby Dawes, will stop abx after a total of 7 days of Zosyn.   Temp (24hrs), Avg:98.3 F (36.8 C), Min:98.1 F (36.7 C), Max:98.6 F (37 C)   Recent Labs Lab 08/07/16 0439 08/08/16 0417 08/09/16 0522 08/12/16 0610 08/13/16 0049  WBC 16.7* 13.6* 11.6* 15.0* 18.5*    Recent Labs Lab 08/09/16 0522 08/10/16 0425 08/11/16 0424 08/12/16 0610 08/13/16 0049  CREATININE 1.79* 2.01* 1.88* 1.80* 2.04*   Estimated Creatinine Clearance: 27 mL/min (A) (by C-G formula based on SCr of 2.04 mg/dL (H)).    Allergies  Allergen Reactions  . Theophyllines Hives    Antimicrobials this admission: 3/14 Zosyn/vancomycin x 1 3/15 azithromycin/CTX x 1 3/16 vancomycin >> 3/17 3/16 Zosyn >> 3/22  Levels/dose changes this admission:   Microbiology results: 3/14 BCx: negative 3/14 UCx: negative  3/16 MRSA PCR: negative  Thank you for allowing pharmacy to be a part of this patient's care.  Ulice Dash D PharmD 08/13/2016 11:10 AM

## 2016-08-13 NOTE — Progress Notes (Addendum)
Pt progressed from HFNC off to 6 liters [place].  Tolerating well, adequate oral intake today, 1800 ccs UOP, had BM, pt tolerated bath and bed change, although it made him very tired.  A&O x 4, palliative spoke with him and his daughter and we started oral morphine for dyspnea.  Pt refuses to turn, writer has bed turning him when [facility] is low enough.

## 2016-08-13 NOTE — Progress Notes (Signed)
Patient has been having more frequent PVCs and bigemini PVCs as shift has progressed. Received 40 mg of IV lasix and has has large output (about 1.6 L so far). Morning labs did not include a basic metabolic panel or potassium. RN notified phlebotomist to come earlier than typical 04:00-05:00 to draw morning labs and paged hospitalist on call (awaiting call back). Phlebotomist stated some would be up shortly. No chest pain or complaints from patient. Team will continue to monitor.

## 2016-08-13 NOTE — Progress Notes (Signed)
Pt is comfortable on [place] O2 @ 5-6 LPM with SpO2 90%. He is DNR. I have ordered transfer to Miguel Huber floor  Merton Border, MD PCCM service Mobile [phone] Pager [phone] 08/13/2016

## 2016-08-13 NOTE — Progress Notes (Signed)
Inpatient Diabetes Program Recommendations  AACE/ADA: New Consensus Statement on Inpatient Glycemic Control (2015)  Target Ranges:  Prepandial:   less than 140 mg/dL      Peak postprandial:   less than 180 mg/dL (1-2 hours)      Critically ill patients:  140 - 180 mg/dL   Results for HINTON, LUELLEN (MRN [id number]) as of 08/13/2016 13:57  Ref. Range 08/13/2016 08:25 08/13/2016 12:26  Glucose-Capillary Latest Ref Range: 65 - 99 mg/dL 185 (H) 292 (H)   Review of Glycemic Control  Diabetes history:DM2 Outpatient Diabetes medications: NPH 25 units BID, Regular 10 units BID  Current orders for Inpatient glycemic control: increased to Lantus 29units daily, Novolog 0-9 units tid, Novolog 0-5 unitsqhs, Novolog 3 units tid meal coverage  Inpatient Diabetes Program Recommendations:   Glucose is increased into the 290's with current Novolog correction scale and meal coverage dose. Please consider increasing meal coverage to Novolog 5 units tid with meals in addition to current correction scale.  Thanks,  Tama Headings RN, MSN, [facility] Inpatient Diabetes Coordinator Team Pager [phone] (8a-5p)=

## 2016-08-13 NOTE — Progress Notes (Signed)
Pt declines condom catheter

## 2016-08-13 NOTE — Plan of Care (Signed)
Problem: Pain Managment: Goal: General experience of comfort will improve Outcome: Progressing No complaints of pain during shift. Patient only slept a few hours in between 02:00 and 04:30.  Problem: Activity: Goal: Risk for activity intolerance will decrease Outcome: Not Progressing Patient with shortness of breath whenever moved much in bed or with extended conversation.

## 2016-08-13 NOTE — Progress Notes (Signed)
[place] at [place] NAME: Miguel Huber    MR#:  [id number]  DATE OF BIRTH:  March 22, 1928  SUBJECTIVE:  CHIEF COMPLAINT:  Patient's shortness of breath is Better today and patient was placed on high flow oxygen through nasal cannulas, now on 50% of FiO2. He lives on 2 L of oxygen at home for chronic pulmonary fibrosis. The patient was seen by pulmonologist as recommended to continue  steroids, The patient was initiated on Lasix yesterday, diuresed approximately, feels somewhat better, O2 sats requirement decreased from 65% to 40% today. He feels better today, less shortness of breath REVIEW OF SYSTEMS:  CONSTITUTIONAL: No fever, fatigue or weakness.  EYES: No blurred or double vision.  EARS, NOSE, AND THROAT: No tinnitus or ear pain.  RESPIRATORY: Positive cough,Worsening of shortness of breath Today, denies wheezing or hemoptysis.  CARDIOVASCULAR: No chest pain, orthopnea, edema.  GASTROINTESTINAL: No nausea, vomiting, diarrhea or abdominal pain.  GENITOURINARY: No dysuria, hematuria.  ENDOCRINE: No polyuria, nocturia,  HEMATOLOGY: No anemia, easy bruising or bleeding SKIN: No rash or lesion. MUSCULOSKELETAL: No joint pain or arthritis.   NEUROLOGIC: No tingling, numbness, weakness.  PSYCHIATRY: No anxiety or depression.   DRUG ALLERGIES:   Allergies  Allergen Reactions  . Theophyllines Hives    VITALS:  Blood pressure (!) 151/79, pulse 82, temperature 98.2 F (36.8 C), temperature source Axillary, resp. rate (!) 29, height 5\' 7"  (1.702 m), weight 84.4 kg (186 lb 1.1 oz), SpO2 92 %.  PHYSICAL EXAMINATION:  GENERAL:  83 y.o.-year-old patient lying in the bed In mild respiratory distress, more comfortable today, unless on exertion, and speech  EYES: Pupils equal, round, reactive to light and accommodation. No scleral icterus. Extraocular muscles intact.  HEENT: Head atraumatic, normocephalic. Oropharynx and nasopharynx clear.   NECK:  Supple, no jugular venous distention. No thyroid enlargement, no tenderness.  LUNGS: Relatively good air entrance bilaterally, , no wheezing,  rales,rhonchi, intermittently using  accessory muscles of respiration CARDIOVASCULAR: S1, S2 normal. No murmurs, rubs, or gallops.  ABDOMEN: Soft, nontender, nondistended. Bowel sounds present. No organomegaly or mass.  EXTREMITIES: No pedal edema, cyanosis, or clubbing.  NEUROLOGIC: Cranial nerves II through XII are intact. Muscle strength 5/5 in all extremities. Sensation intact. Gait not checked.  PSYCHIATRIC: The patient is alert and oriented x 3.  SKIN: No obvious rash, lesion, or ulcer.    LABORATORY PANEL:   CBC  Recent Labs Lab 08/13/16 0049  WBC 18.5*  HGB 14.2  HCT 42.5  PLT 215   ------------------------------------------------------------------------------------------------------------------  Chemistries   Recent Labs Lab 08/13/16 0049  NA 145  K 4.8  CL 114*  CO2 22  GLUCOSE 160*  BUN 61*  CREATININE 2.04*  CALCIUM 8.5*  MG 2.8*   ------------------------------------------------------------------------------------------------------------------  Cardiac Enzymes  Recent Labs Lab 08/07/16 1609  TROPONINI 0.03*   ------------------------------------------------------------------------------------------------------------------  RADIOLOGY:  Dg Chest 1 View  Result Date: 08/13/2016 CLINICAL DATA:  83 year old male with dyspnea. EXAM: CHEST 1 VIEW COMPARISON:  Chest radiograph dated 08/08/2016 FINDINGS: There is background of interstitial coarsening and pulmonary fibrosis. Bibasilar densities most consistent with atelectatic changes and scarring. Small focal area of density at the left lung base may be chronic, however superimposed pneumonia is not excluded. Clinical correlation is recommended. There is no pleural effusion or pneumothorax. Stable cardiac silhouette. No acute osseous pathology. IMPRESSION:  Severe underlying chronic lung disease/fibrosis. Focal left lung base hazy density may represent atelectasis/ scarring versus developing infiltrate. Clinical correlation is  recommended. Electronically Signed   By: Anner Crete M.D.   On: 08/13/2016 06:17    EKG:   Orders placed or performed during the hospital encounter of 08/06/16  . ED EKG  . ED EKG  . EKG 12-Lead  . EKG 12-Lead    ASSESSMENT AND PLAN:   83 year old elderly male patient with history of cardiomyopathy, chronic kidney disease, hypertension, hyperlipidemia, pulmonary fibrosis, COPD presented to the emergency room with an episode of passing out.  #Acute hypoxic respiratory failure on chronic respiratory failure; secondary to acute bronchitis with superimposed healthcare associated pneumonia and chronic pulmonary fibrosis. He improved clinically with diuresis, FiO2 decreased from 65% to 40%, appreciate pulmonology's input,, continue  steroids intravenously, Nebulizer treatments, Zosyn , unable to get sputum cultures. Patient by palliative care again and decided on DO NOT RESUSCITATE, weaning off oxygen, patient is not to be escalated to high flow nasal cannulas anymore. Discussed with palliative care. Blood cultures are negative. Continue Lasix intravenously, follow ins and outs, oxygenation, administer Lasix intermittently as needed.    #.Sepsis- from acute bronchitis with superimposed healthcare associated pneumonia  Patient's antibiotics were changed to IV Zosyn.  Vancomycin was discontinued due to MRSA PCR being negative, remains afebrile, unable to get sputum cultures, Blood cultures negative for 5 days Urine culture with no growth Repeat chest x-ray 3/16 with superimposed pneumonia on the right side Leukocytosis is worsening today. The patient does have difficulty swallowing, coughing with swallowing, diet is downgraded to dysphagia with thickened liquids, could be related to recurrent aspirations  #. Syncope  secondary to Hypotension/vasovagal versus hypoxia episode, improved on IV fluids. PT evaluated patient and recommended skilled nursing facility placement for rehabilitation. Acute MI ruled out with negative troponins. Lactic acid is normal  #.  acute on chronic renal failure, known CKD stage III, improved with IV fluids , now off IV fluids and on Lasix, creatinine is worse, but close to baseline  Baseline creatinine seems to be at 1.87 at baseline, and 1.80 today Creatinine 2.48--2.2 --1.96-1.79- 2.01-1.88-1.8-2.04. Avoid nephrotoxins check a.m.labs after the diuresis  # .  chronic Pulmonary fibrosis  follow-up with pulmonology, weaning off high flow oxygen, will not escalate to high flow again, patient will be discharged likely home with hospice versus hospice home  # Diabetes mellitus with significant Hyperglycemia due to steroids,  follow blood glucose levels on advanced insulin Lantus Doses. Stable   All the records are reviewed and case discussed with Care Management/Social Workerr. Management plans discussed with the patient, he is in agreement.  CODE STATUS: FC TOTAL CRITICAL CARE TIME TAKING CARE OF THIS PATIENT: 35 minutes.   POSSIBLE D/C IN 1-2  DAYS, DEPENDING ON CLINICAL CONDITION.  Note: This dictation was prepared with Dragon dictation along with smaller phrase technology. Any transcriptional errors that result from this process are unintentional.   Yolandra Habig M.D on 08/13/2016 at 5:28 PM  Between 7am to 6pm - Pager - [phone] After 6pm go to [email or web address] - password [email or web address] [facility]  Office  [phone]  CC: Primary care physician; Viviana Simpler, MD

## 2016-08-13 NOTE — Progress Notes (Signed)
Physical Therapy Treatment Patient Details Name: Miguel Huber MRN: [id number] DOB: 05/30/1927 Today's Date: 08/13/2016    History of Present Illness 83 yo male with onset of respiratory failure after admission for syncope.  He was septic, had lung disease, pulm fibrosis, leukocytosis, renal insuff, Elevated BS.  Admitted to ICU and now re-referred to PT for mobility. PMHx:  COPD, DVT, HTN, OA renal insufficiency.    PT Comments    Pt is making limited progress towards goals secondary to respiratory status. Pt on 6L of O2 at this time and desats quickly with exertion. Pt also fatigues with talking. Multiple rest breaks required for all there-ex. Limited to bed level at this time. Will progress as able.   Follow Up Recommendations  SNF     Equipment Recommendations  None recommended by PT    Recommendations for Other Services       Precautions / Restrictions Precautions Precautions: Fall Restrictions Weight Bearing Restrictions: No    Mobility  Bed Mobility               General bed mobility comments: not performed this date secondary to respiratory status  Transfers                    Ambulation/Gait                 Stairs            Wheelchair Mobility    Modified Rankin (Stroke Patients Only)       Balance                                    Cognition Arousal/Alertness: Awake/alert Behavior During Therapy: WFL for tasks assessed/performed Overall Cognitive Status: Within Functional Limits for tasks assessed                      Exercises Other Exercises Other Exercises: Supine ther-ex performed including ankle pumps, quad sets, SLRs, SAQ, and B UE elbow flexion and shoulder flexion. All ther-ex performed x 10 reps with cga. Multiple rest breaks required secondary to breathing difficulty    General Comments        Pertinent Vitals/Pain Pain Assessment: No/denies pain    Home Living                      Prior Function            PT Goals (current goals can now be found in the care plan section) Acute Rehab PT Goals Patient Stated Goal: to try to get moving again PT Goal Formulation: With patient Time For Goal Achievement: 08/23/16 Potential to Achieve Goals: Fair Progress towards PT goals: Progressing toward goals    Frequency    Min 2X/week      PT Plan Current plan remains appropriate    Co-evaluation             End of Session Equipment Utilized During Treatment: Oxygen Activity Tolerance: Treatment limited secondary to medical complications (Comment) Patient left: in bed;with call bell/phone within reach;with bed alarm set Nurse Communication: Mobility status PT Visit Diagnosis: Difficulty in walking, not elsewhere classified (R26.2);Muscle weakness (generalized) (M62.81)     Time: 6063-0160 PT Time Calculation (min) (ACUTE ONLY): 16 min  Charges:  $Therapeutic Exercise: 8-22 mins  G Codes:       [name] 08/13/2016, 5:15 PM  [name], PT, DPT [phone]

## 2016-08-13 NOTE — Progress Notes (Addendum)
[facility] Progess Note    ASSESSMENT/PLAN   83 yo male who is DNI with h/o pulmonary fibrosis, cardiomyopathy, COPD presented with syncope. Possible pneumonia with sepsis and AKI.   PULMONARY A: Pulmonary fibrosis and severe bullous emphysema.  Currently on high flow at 50% pt required as much as 80 to 100% previously, but is now "stuck" and appears to have amde minimal progress over the past week. Minimal improvement with high dose steroids or empiric diuresis and abx x 1 week.  DNI. -Abx course will be completed tomorrow.  P:   Wean down oxygen as tolerated.  Continue steroids, dose decreased due to hyperglycemia.  Consult palliative care.   CARDIOVASCULAR A: Essential hypertension.  P:  Will monitor and treat as needed. Takes lisinopril at home, can restart as renal function improves.   RENAL A:  AKI on CKD. P:   Will continue to monitor.   GASTROINTESTINAL A:  -- P:   --  HEMATOLOGIC A:  -- P:  --  INFECTIOUS A:  Continue abx empirically for possible pneumonia.  P:    Micro/culture results:  BCx2 3/14; negative UC  3/14; negative.  Sputum -- MRSA PCR 3/16; negative.   Antibiotics: Zosyn 3/16>>  ENDOCRINE A:  DM. Glucose mildly elevated today, will decrease steroids.  P:   Continue SSI.  Continue lantus 22 units qday.   NEUROLOGIC A:  --  MAJOR EVENTS/TEST RESULTS:   Best Practices  DVT Prophylaxis:heparin SQ GI Prophylaxis: protonix.   Family meeting:  D/W pt's daughter, Ileene Patrick, explained that the patient appeared to have stabilized in terms of his pneumonia, however he is still very sick due to his underlying severe emphysema and pulmonary fibrosis. He may not be able to recover and go back to his previous functional status. I recommended that he may have to be transitioned to hospice or comfort measures. I will consult Palliative care to help in these discussions.    ---------------------------------------   ----------------------------------------   Name: ANTOINETTE HASKETT MRN: [id number] DOB: [birth year]    ADMISSION DATE:  08/06/2016   SUBJECTIVE:   Pt currently has reduced mental status and can not provide history or review of systems.   Review of Systems:  --  VITAL SIGNS: Temp:  [98.1 F (36.7 C)-98.6 F (37 C)] 98.2 F (36.8 C) (03/21 0800) Pulse Rate:  [71-104] 91 (03/21 0900) Resp:  [23-38] 29 (03/21 0900) BP: (107-148)/(58-88) 145/79 (03/21 0900) SpO2:  [89 %-100 %] 90 % (03/21 0900) FiO2 (%):  [38 %-45 %] 43 % (03/21 0800) HEMODYNAMICS:   VENTILATOR SETTINGS: FiO2 (%):  [38 %-45 %] 43 % INTAKE / OUTPUT:  Intake/Output Summary (Last 24 hours) at 08/13/16 1038 Last data filed at 08/13/16 4315  Gross per 24 hour  Intake              625 ml  Output             2250 ml  Net            -1625 ml    PHYSICAL EXAMINATION: Physical Examination:   VS: BP (!) 145/79   Pulse 91   Temp 98.2 F (36.8 C) (Axillary)   Resp (!) 29   Ht 5\' 7"  (1.702 m)   Wt 186 lb 1.1 oz (84.4 kg)   SpO2 90%   BMI 29.14 kg/m   General Appearance: No distress  Neuro:without focal findings, mental status reduced.  HEENT: PERRLA, EOM intact. Pulmonary: diffuse  bilateral crackles decrease air entry bilaterally. No change from  3/20 CardiovascularNormal S1,S2.  No m/r/g.   Abdomen: Benign, Soft, non-tender. Renal:  No costovertebral tenderness  GU:  Not performed at this time. Endocrine: No evident thyromegaly. Skin:   warm, no rashes, no ecchymosis  Extremities: normal, no cyanosis, clubbing.   LABS:   LABORATORY PANEL:   CBC  Recent Labs Lab 08/13/16 0049  WBC 18.5*  HGB 14.2  HCT 42.5  PLT 215    Chemistries   Recent Labs Lab 08/10/16 0425  08/13/16 0049  NA 145  < > 145  K 4.7  < > 4.8  CL 117*  < > 114*  CO2 23  < > 22  GLUCOSE 300*  < > 160*  BUN 64*  < > 61*  CREATININE 2.01*  < > 2.04*  CALCIUM 8.3*  <  > 8.5*  MG 2.6*  --  2.8*  PHOS 3.1  --   --   < > = values in this interval not displayed.   Recent Labs Lab 08/12/16 1145 08/12/16 1310 08/12/16 1631 08/12/16 1946 08/13/16 0017 08/13/16 0825  GLUCAP 273* 258* 266* 224* 150* 185*   No results for input(s): PHART, PCO2ART, PO2ART in the last 168 hours. No results for input(s): AST, ALT, ALKPHOS, BILITOT, ALBUMIN in the last 168 hours.  Cardiac Enzymes  Recent Labs Lab 08/07/16 1609  TROPONINI 0.03*    RADIOLOGY:  Dg Chest 1 View  Result Date: 08/13/2016 CLINICAL DATA:  83 year old male with dyspnea. EXAM: CHEST 1 VIEW COMPARISON:  Chest radiograph dated 08/08/2016 FINDINGS: There is background of interstitial coarsening and pulmonary fibrosis. Bibasilar densities most consistent with atelectatic changes and scarring. Small focal area of density at the left lung base may be chronic, however superimposed pneumonia is not excluded. Clinical correlation is recommended. There is no pleural effusion or pneumothorax. Stable cardiac silhouette. No acute osseous pathology. IMPRESSION: Severe underlying chronic lung disease/fibrosis. Focal left lung base hazy density may represent atelectasis/ scarring versus developing infiltrate. Clinical correlation is recommended. Electronically Signed   By: Anner Crete M.D.   On: 08/13/2016 06:17       --Marda Stalker, MD.  ICU Pager: [phone] [facility] Office Number: [phone]   08/13/2016   Critical Care Attestation.  I have personally obtained a history, examined the patient, evaluated laboratory and imaging results, formulated the assessment and plan and placed orders. The Patient requires high complexity decision making for assessment and support, frequent evaluation and titration of therapies, application of advanced monitoring technologies and extensive interpretation of multiple databases. The patient has critical illness that could lead  imminently to failure of 1 or more organ systems and requires the highest level of physician preparedness to intervene.  Critical Care Time devoted to patient care services described in this note is 32 minutes and is exclusive of time spent in procedures.

## 2016-08-13 NOTE — Progress Notes (Signed)
Spoke with Dr. Jannifer Franklin on the phone. MD acknowledged situation and ordered serum magnesium level in addition to already ordered lab work. Team will continue to monitor.

## 2016-08-14 DIAGNOSIS — J9621 Acute and chronic respiratory failure with hypoxia: Secondary | ICD-10-CM

## 2016-08-14 LAB — CBC
HCT: 41.5 % (ref 40.0–52.0)
Hemoglobin: 14.1 g/dL (ref 13.0–18.0)
MCH: 32.5 pg (ref 26.0–34.0)
MCHC: 34.1 g/dL (ref 32.0–36.0)
MCV: 95.5 fL (ref 80.0–100.0)
PLATELETS: 250 10*3/uL (ref 150–440)
RBC: 4.34 MIL/uL — ABNORMAL LOW (ref 4.40–5.90)
RDW: 13.9 % (ref 11.5–14.5)
WBC: 16.8 10*3/uL — AB (ref 3.8–10.6)

## 2016-08-14 LAB — BASIC METABOLIC PANEL
ANION GAP: 7 (ref 5–15)
BUN: 59 mg/dL — AB (ref 6–20)
CALCIUM: 8.3 mg/dL — AB (ref 8.9–10.3)
CO2: 25 mmol/L (ref 22–32)
CREATININE: 1.89 mg/dL — AB (ref 0.61–1.24)
Chloride: 109 mmol/L (ref 101–111)
GFR calc Af Amer: 35 mL/min — ABNORMAL LOW (ref >60)
GFR, EST NON AFRICAN AMERICAN: 31 mL/min — AB (ref >60)
GLUCOSE: 179 mg/dL — AB (ref 65–99)
Potassium: 5 mmol/L (ref 3.5–5.1)
Sodium: 141 mmol/L (ref 135–145)

## 2016-08-14 LAB — GLUCOSE, CAPILLARY
Glucose-Capillary: 168 mg/dL — ABNORMAL HIGH (ref 65–99)
Glucose-Capillary: 222 mg/dL — ABNORMAL HIGH (ref 65–99)

## 2016-08-14 MED ORDER — MORPHINE SULFATE (CONCENTRATE) 10 MG/0.5ML PO SOLN
5.0000 mg | ORAL | Status: DC | PRN
  Administered 2016-08-14 – 2016-08-17 (×4): 5 mg via ORAL
  Filled 2016-08-14 (×5): qty 1

## 2016-08-14 MED ORDER — ENSURE ENLIVE PO LIQD
237.0000 mL | ORAL | Status: DC
  Administered 2016-08-14 – 2016-08-17 (×4): 237 mL via ORAL

## 2016-08-14 MED ORDER — FUROSEMIDE 10 MG/ML IJ SOLN
40.0000 mg | Freq: Once | INTRAMUSCULAR | Status: AC
Start: 2016-08-14 — End: 2016-08-14
  Administered 2016-08-14: 40 mg via INTRAVENOUS
  Filled 2016-08-14: qty 4

## 2016-08-14 MED ORDER — LORAZEPAM 1 MG PO TABS
1.0000 mg | ORAL_TABLET | Freq: Four times a day (QID) | ORAL | Status: DC | PRN
  Administered 2016-08-15: 1 mg via ORAL
  Filled 2016-08-14: qty 1

## 2016-08-14 NOTE — Progress Notes (Signed)
[facility] [place] at [place] NAME: Miguel Huber    MR#:  [id number]  DATE OF BIRTH:  1928-01-26  SUBJECTIVE:  CHIEF COMPLAINT:  Patient's shortness of breath is Better today and patient was placed on high flow oxygen through nasal cannulas, now on 50% of FiO2. He lives on 2 L of oxygen at home for chronic pulmonary fibrosis. The patient was seen by pulmonologist as recommended to continue  steroids, The patient was initiated on Lasix , diuresed , feels somewhat better, O2 sats requirement decreased from 65% to 4 l;iters today. He feels the same today, complains of some shortness of breath, the patient is not sure if he was going to improve. The patient was seen by palliative care, hospice home was recommended REVIEW OF SYSTEMS:  CONSTITUTIONAL: No fever, fatigue or weakness.  EYES: No blurred or double vision.  EARS, NOSE, AND THROAT: No tinnitus or ear pain.  RESPIRATORY: Positive cough,Worsening of shortness of breath Today, denies wheezing or hemoptysis.  CARDIOVASCULAR: No chest pain, orthopnea, edema.  GASTROINTESTINAL: No nausea, vomiting, diarrhea or abdominal pain.  GENITOURINARY: No dysuria, hematuria.  ENDOCRINE: No polyuria, nocturia,  HEMATOLOGY: No anemia, easy bruising or bleeding SKIN: No rash or lesion. MUSCULOSKELETAL: No joint pain or arthritis.   NEUROLOGIC: No tingling, numbness, weakness.  PSYCHIATRY: No anxiety or depression.   DRUG ALLERGIES:   Allergies  Allergen Reactions  . Theophyllines Hives    VITALS:  Blood pressure (!) 152/79, pulse 90, temperature 98 F (36.7 C), temperature source Oral, resp. rate 18, height 5\' 7"  (1.702 m), weight 84.4 kg (186 lb 1.1 oz), SpO2 92 %.  PHYSICAL EXAMINATION:  GENERAL:  83 y.o.-year-old patient lying in the bed In mild respiratory distress, Relatively comfortable today, unless on exertion, and speech  EYES: Pupils equal, round, reactive to light and accommodation. No  scleral icterus. Extraocular muscles intact.  HEENT: Head atraumatic, normocephalic. Oropharynx and nasopharynx clear.  NECK:  Supple, no jugular venous distention. No thyroid enlargement, no tenderness.  LUNGS: Relatively good air entrance bilaterally, , no wheezing,  rales,rhonchi, intermittently using  accessory muscles of respiration CARDIOVASCULAR: S1, S2 normal. No murmurs, rubs, or gallops.  ABDOMEN: Soft, nontender, nondistended. Bowel sounds present. No organomegaly or mass.  EXTREMITIES: No pedal edema, cyanosis, or clubbing.  NEUROLOGIC: Cranial nerves II through XII are intact. Muscle strength 5/5 in all extremities. Sensation intact. Gait not checked.  PSYCHIATRIC: The patient is alert and oriented x 3.  SKIN: No obvious rash, lesion, or ulcer.    LABORATORY PANEL:   CBC  Recent Labs Lab 08/14/16 0527  WBC 16.8*  HGB 14.1  HCT 41.5  PLT 250   ------------------------------------------------------------------------------------------------------------------  Chemistries   Recent Labs Lab 08/13/16 0049 08/14/16 0527  NA 145 141  K 4.8 5.0  CL 114* 109  CO2 22 25  GLUCOSE 160* 179*  BUN 61* 59*  CREATININE 2.04* 1.89*  CALCIUM 8.5* 8.3*  MG 2.8*  --    ------------------------------------------------------------------------------------------------------------------  Cardiac Enzymes No results for input(s): TROPONINI in the last 168 hours. ------------------------------------------------------------------------------------------------------------------  RADIOLOGY:  Dg Chest 1 View  Result Date: 08/13/2016 CLINICAL DATA:  83 year old male with dyspnea. EXAM: CHEST 1 VIEW COMPARISON:  Chest radiograph dated 08/08/2016 FINDINGS: There is background of interstitial coarsening and pulmonary fibrosis. Bibasilar densities most consistent with atelectatic changes and scarring. Small focal area of density at the left lung base may be chronic, however superimposed  pneumonia is not excluded. Clinical correlation is  recommended. There is no pleural effusion or pneumothorax. Stable cardiac silhouette. No acute osseous pathology. IMPRESSION: Severe underlying chronic lung disease/fibrosis. Focal left lung base hazy density may represent atelectasis/ scarring versus developing infiltrate. Clinical correlation is recommended. Electronically Signed   By: Anner Crete M.D.   On: 08/13/2016 06:17    EKG:   Orders placed or performed during the hospital encounter of 08/06/16  . ED EKG  . ED EKG  . EKG 12-Lead  . EKG 12-Lead    ASSESSMENT AND PLAN:   83 year old elderly male patient with history of cardiomyopathy, chronic kidney disease, hypertension, hyperlipidemia, pulmonary fibrosis, COPD presented to the emergency room with an episode of passing out.  #Acute hypoxic respiratory failure on chronic respiratory failure; secondary to acute bronchitis with superimposed healthcare associated pneumonia and chronic pulmonary fibrosis. He improved clinically with diuresis, FiO2 decreased from 65% to 4 liters , appreciate pulmonology's input,, continue   Nebulizer treatments,  unable to get sputum cultures. Patient by palliative care again and decided on DO NOT RESUSCITATE, weaning off oxygen, patient is not to be escalated to high flow nasal cannulas anymore. Discussed with palliative care. Blood cultures are negative. Continue Lasix intravenously, follow ins and outs, oxygenation, possible discharge to hospice home tomorrow  #.Sepsis- from acute bronchitis with superimposed healthcare associated pneumonia  Patient's antibiotics were changed to IV Zosyn, off antibiotics.  Vancomycin was discontinued due to MRSA PCR being negative, remains afebrile, unable to get sputum cultures, Blood cultures negative for 5 days Urine culture with no growth Repeat chest x-ray 3/16 with superimposed pneumonia on the right side Leukocytosis is some better today. The patient does  have difficulty swallowing, coughing with swallowing, diet was downgraded to dysphagia with thickened liquids, could be related to recurrent aspirations.   #. Syncope secondary to Hypotension/vasovagal versus hypoxia episode, improved on IV fluids. PT evaluated patient and recommended skilled nursing facility placement for rehabilitation. Acute MI ruled out with negative troponins. Lactic acid is normal. Patient was seen by palliative care, hospice home was recommended, patient was agreeable, likely discharge tomorrow  #.  acute on chronic renal failure, known CKD stage III, improved with IV fluids , now off IV fluids and on Lasix, creatinine is some better, at  baseline  Baseline creatinine seems to be at 1.87, and 1.89 today Creatinine 2.48--2.2 --1.96-1.79- 2.01-1.88-1.8-2.04- 1.89. Avoid nephrotoxins check a.m.labs after the diuresis  # .  chronic Pulmonary fibrosis  follow-up with pulmonology, weaning off high flow oxygen, will not escalate to high flow again, patient will be discharged to hospice home when bed is available  # Diabetes mellitus with significant Hyperglycemia due to steroids,  now off insulin and sliding scale.    All the records are reviewed and case discussed with Care Management/Social Workerr. Management plans discussed with the patient, he is in agreement.  CODE STATUS: FC TOTAL CRITICAL CARE TIME TAKING CARE OF THIS PATIENT: 35 minutes.   POSSIBLE D/C IN 1-2  DAYS, DEPENDING ON CLINICAL CONDITION.  Note: This dictation was prepared with Dragon dictation along with smaller phrase technology. Any transcriptional errors that result from this process are unintentional.   Leo Weyandt M.D on 08/14/2016 at 4:46 PM  Between 7am to 6pm - Pager - [phone] After 6pm go to [email or web address] - password [email or web address] [facility]  Office  [phone]  CC: Primary care physician; Viviana Simpler, MD

## 2016-08-14 NOTE — Care Management Important Message (Signed)
Important Message  Patient Details  Name: Miguel Huber MRN: [id number] Date of Birth: 04/14/1928   Medicare Important Message Given:  Yes    Shelbie Ammons, RN 08/14/2016, 8:56 AM

## 2016-08-14 NOTE — Progress Notes (Signed)
New Hospice home referral received from [place]. Hospital Care team and family made aware there is currently no bed availability. Patient information faxed to referral. Hospital Liaison to follow up tomorrow. Flo Shanks RN, BSN, [place] [facility]  [place], [facility] [phone] c

## 2016-08-14 NOTE — Progress Notes (Signed)
  Speech Language Pathology Treatment: Dysphagia  Patient Details Name: Miguel Huber MRN: [id number] DOB: Aug 03, 1927 Today's Date: 08/14/2016 Time: 1430-1500 SLP Time Calculation (min) (ACUTE ONLY): 30 min  Assessment / Plan / Recommendation Clinical Impression    HPI HPI: Pt is a 83 y.o. male with a known history of Pulmonary fibrosis, type 2 diabetes mellitus, COPD, cardiomyopathy, erectile dysfunction, GERD, hypertension, hyperlipidemia, chronic kidney disease presented to the emergency room after he passed out. Patient was going to the bathroom when he passed out. Patient lives with his son at home. She uses home oxygen. He ambulates with the help of a walker at home. Patient was evaluated in the emergency room his WBC count was elevated and blood pressure was on the lower side. Code sepsis was called and patient received IV fluid bolus and broad-spectrum IV antibiotics. Chest x-ray showed interstitial lung disease with infiltrates. Patient not on any oral steroids as outpatient. Has some chills but no fever. Has generalized weakness. No history of any head injury. Has shortness of breath on exertion and occasional cough. Pt has been tolerating his current pureed diet w/ Honey consistency liquids since BSE on 08/09/16. Pt continues to have increased WOB/SOB w/ increased exertion of eating, talking, moving about in bed. Pt appears min HOH. Pt is being followed by Palliative Care.       SLP Plan  Continue with current plan of care       Recommendations  Diet recommendations: Dysphagia 1 (puree);Thin liquid Liquids provided via: Cup;Straw Medication Administration: Whole meds with puree Supervision: Patient able to self feed;Staff to assist with self feeding;Intermittent supervision to cue for compensatory strategies Compensations: Minimize environmental distractions;Slow rate;Small sips/bites;Lingual sweep for clearance of pocketing;Multiple dry swallows after each bite/sip;Follow solids  with liquid Postural Changes and/or Swallow Maneuvers: Seated upright 90 degrees;Upright 30-60 min after meal                General recommendations:  (Dietician f/u) Oral Care Recommendations: Oral care BID;Staff/trained caregiver to provide oral care Follow up Recommendations: Skilled Nursing facility (TBD) SLP Visit Diagnosis: Dysphagia, oropharyngeal phase (R13.12) Plan: Continue with current plan of care       [place], [place], CCC-SLP Tayten Bergdoll 08/14/2016, 6:55 PM

## 2016-08-14 NOTE — Clinical Social Work Note (Signed)
CSW consulted for residential hospice. CSW spoke with pt's daughter to offer chose. Pt's daughter chose [facility]. Referral was made. CSW will continue to follow.   Darden Dates, MSW, LCSW  Clinical Social Worker  [phone]

## 2016-08-14 NOTE — Progress Notes (Signed)
Nutrition Follow-up  DOCUMENTATION CODES:   Not applicable  INTERVENTION:  Will discontinue Honey Thick Mighty Shake.  Provide Ensure Enlive po once daily, each supplement provides 350 kcal and 20 grams of protein.   Continue Magic cup TID with meals, each supplement provides 290 kcal and 9 grams of protein.  NUTRITION DIAGNOSIS:   Unintentional weight loss related to chronic illness as evidenced by per patient/family report.  Ongoing.  GOAL:   Patient will meet greater than or equal to 90% of their needs  Met.  MONITOR:   PO intake, I & O's, Labs, Weight trends, Supplement acceptance  REASON FOR ASSESSMENT:   Consult Assessment of nutrition requirement/status  ASSESSMENT:   Miguel Huber  is a 83 y.o. male with a known history of Pulmonary fibrosis, type 2 diabetes mellitus, COPD, cardiomyopathy, erectile dysfunction, GERD, hypertension, hyperlipidemia, chronic kidney disease presented to the emergency room after he passed out.  -Patient's fluids upgraded from honey-thick to thin on 3/20. Remains on dysphagia 1 diet. -Per PMT note 3/21 goal is to wean off HFNC to a place of comfort and main focus of care is comfort. Patient now on 3L [place].  Spoke with patient at bedside. He reports his appetite is good he is just still having trouble eating due to difficulty breathing. Also reports his ribs hurt from coughing. Denies any abdominal pain or N/V. Patient reports he does not like the [employer]. He does like Ensure and wants to drink one per day.   Meal Completion: 50-100% In the past 24 hours patient has had approximately 1662 kcal (100% minimum kcal needs) and 71 grams of protein (89% minimum protein needs).  Medications reviewed and include: methylprednisolone 40 mg Q8hrs.  Labs reviewed: CBG 148-292 past 24 hrs, BUN 59, Creatinine 1.89.  No subsequent weights since initial assessment to trend.  Diet Order:  DIET - DYS 1 Room service appropriate? Yes with Assist;  Fluid consistency: Thin  Skin:  Reviewed, no issues  Last BM:  08/14/2016  Height:   Ht Readings from Last 1 Encounters:  08/08/16 5' 7"  (1.702 m)    Weight:   Wt Readings from Last 1 Encounters:  08/08/16 186 lb 1.1 oz (84.4 kg)    Ideal Body Weight:  70 kg  BMI:  Body mass index is 29.14 kg/m.  Estimated Nutritional Needs:   Kcal:  1600-2000 calories  Protein:  80-95 gn  Fluid:  >/= 1.6L  EDUCATION NEEDS:   Education needs no appropriate at this time  Willey Blade, MS, RD, LDN Pager: [phone] After Hours Pager: [phone]

## 2016-08-14 NOTE — Progress Notes (Signed)
Daily Progress Note   Patient Name: Miguel Huber       Date: 08/14/2016 DOB: [birth year]  Age: 83 y.o. MRN#: [id number] Attending Physician: Theodoro Grist, MD Primary Care Physician: Viviana Simpler, MD Admit Date: 08/06/2016  Reason for Consultation/Follow-up: Establishing goals of care, Non pain symptom management and Psychosocial/spiritual support  Subjective: -contiued conversation with patient and his daughter/Miguel Huber/main support person regarding  diagnosis, prognosis, GOC, EOL wishes disposition and options.   Concepts specific to code status, artifical feeding and hydration, continued IV antibiotics and rehospitalization was had.  The difference between a aggressive medical intervention path  and a palliative comfort care path for this patient at this time was had.  Values and goals of care important to patient and family were attempted to be elicited.  Patient and his family hope is for comfort and dignity, " I know he doesn't have much time"  Discussed hospice facility option, this is their first choice.  Home is not an option  Natural trajectory and expectations at EOL were discussed.  Questions and concerns addressed.   Family encouraged to call with questions or concerns.  PMT will continue to support holistically.   Length of Stay: 7  Current Medications: Scheduled Meds:  . aspirin EC  81 mg Oral Daily  . chlorhexidine  15 mL Mouth Rinse BID  . cholecalciferol  1,000 Units Oral Daily  . guaiFENesin  600 mg Oral BID  . heparin  5,000 Units Subcutaneous Q8H  . insulin aspart  0-5 Units Subcutaneous QHS  . insulin aspart  0-9 Units Subcutaneous TID WC  . insulin aspart  3 Units Subcutaneous TID WC  . insulin glargine  29 Units Subcutaneous Daily  . ipratropium-albuterol   3 mL Nebulization Q6H  . lidocaine  1 patch Transdermal Q24H  . mouth rinse  15 mL Mouth Rinse q12n4p  . methylPREDNISolone (SOLU-MEDROL) injection  40 mg Intravenous Q8H  . pantoprazole  40 mg Oral Daily  . piperacillin-tazobactam  3.375 g Intravenous Q8H  . rosuvastatin  20 mg Oral QHS    Continuous Infusions:   PRN Meds: acetaminophen **OR** acetaminophen, ipratropium-albuterol, lidocaine-prilocaine, menthol-cetylpyridinium, morphine CONCENTRATE, ondansetron **OR** ondansetron (ZOFRAN) IV, oxyCODONE-acetaminophen, senna-docusate, temazepam, trolamine salicylate  Physical Exam  Constitutional: He appears cachectic. He appears ill.  Cardiovascular: Normal rate, regular rhythm and normal heart  sounds.   Pulmonary/Chest: Tachypnea noted. He has decreased breath sounds in the right lower field and the left lower field.  Neurological: He is alert.  Skin: Skin is warm and dry.            Vital Signs: BP (!) 175/85 (BP Location: Right Arm)   Pulse 84   Temp 98 F (36.7 C) (Oral)   Resp 20   Ht 5\' 7"  (1.702 m)   Wt 84.4 kg (186 lb 1.1 oz)   SpO2 91%   BMI 29.14 kg/m  SpO2: SpO2: 91 % O2 Device: O2 Device: Nasal Cannula O2 Flow Rate: O2 Flow Rate (L/min): 4 L/min  Intake/output summary:  Intake/Output Summary (Last 24 hours) at 08/14/16 1046 Last data filed at 08/14/16 1026  Gross per 24 hour  Intake              807 ml  Output             2275 ml  Net            -1468 ml   LBM: Last BM Date: 08/14/16 Baseline Weight: Weight: 84.4 kg (186 lb) Most recent weight: Weight: 84.4 kg (186 lb 1.1 oz)       Palliative Assessment/Data: 30 % at best    Flowsheet Rows     Most Recent Value  Intake Tab  Date first seen by Palliative Care  08/13/16  Clinical Assessment  Psychosocial & Spiritual Assessment  Palliative Care Outcomes      Patient Active Problem List   Diagnosis Date Noted  . Acute respiratory distress   . Palliative care by specialist   . DNR (do not  resuscitate)   . Dyspnea   . Syncope 08/07/2016  . Hypotension 12/07/2015  . Chronic diastolic heart failure ([place]) 11/26/2015  . COPD (chronic obstructive pulmonary disease) ([place]) 11/16/2015  . Postinflammatory pulmonary fibrosis ([place]) 11/16/2015  . History of DVT (deep vein thrombosis) 11/16/2015  . Cardiomyopathy ([place]) 11/16/2015  . Chronic hypoxemic respiratory failure ([place]) 08/20/2015  . Routine general medical examination at a health care facility 08/02/2013  . Neuropathy ([place]) 08/02/2013  . Constipation, chronic 10/10/2011  . Type 2 diabetes, controlled, with renal manifestation ([place]) 06/06/2011  . Hypertension   . UNSPECIFIED VENOUS INSUFFICIENCY 08/14/2010  . OSTEOARTHRITIS 04/09/2010  . Cough 01/09/2010  . BACK PAIN, LUMBAR, WITH RADICULOPATHY 06/01/2008  . Chronic kidney disease, stage IV (severe) ([place]) 04/20/2007  . Hyperlipemia 10/13/2006  . ALLERGIC RHINITIS 10/13/2006  . COPD (chronic obstructive pulmonary disease) with emphysema ([place]) 10/13/2006  . GERD 10/13/2006    Palliative Care Assessment & Plan   Patient Profile: 83 y.o. male  admitted on 08/06/2016 with a known history of Pulmonary fibrosis, type 2 diabetes mellitus, COPD, cardiomyopathy, erectile dysfunction, GERD, hypertension, hyperlipidemia,chronic kidney disease presented to the emergency room after he passed out.  He uses home oxygen.He ambulates with the help of a walker at home.  Patient lives with his son at home.( today patient and his daughter Miguel Huber tell mer that Miguel Huber the son who lives at home with this patient "is not helpful and is an alcoholic"  Patient was evaluated in the emergency room his WBC count was elevated and blood pressure was on the lower side.Code sepsis was called and patient received IV fluid bolus and broad-spectrum IV antibiotics.  Chest x-ray showed interstitial lung disease with infiltrates.    Admitted for further evaluation and treatment.  Acute hypoxic  respiratory failure on chronic respiratory  failure; secondary to acute bronchitis with superimposed healthcare associated pneumonia and chronic pulmonary fibrosis.     Currently weaned off HFNC, nasal cannula only, no escalation of O2, utilize prn medications for dyspnea.   Assessment: -chronic respiratory failure - continued physical and functional decline 2/2 to acute on chronic respiratory failure. - focus of care is comfort  Recommendations/Plan:  No escalation of care  No life prolonging measures  Symptom management to enhance comfort and dignity  Hopeful for a hospice facility  Goals of Care and Additional Recommendations:  Limitations on Scope of Treatment: Full Comfort Care  Code Status:    Code Status Orders        Start     Ordered   08/13/16 1421  Do not attempt resuscitation (DNR)  Continuous    Question Answer Comment  In the event of cardiac or respiratory ARREST Do not call a "code blue"   In the event of cardiac or respiratory ARREST Do not perform Intubation, CPR, defibrillation or ACLS   In the event of cardiac or respiratory ARREST Use medication by any route, position, wound care, and other measures to relive pain and suffering. May use oxygen, suction and manual treatment of airway obstruction as needed for comfort.      08/13/16 1420    Code Status History    Date Active Date Inactive Code Status Order ID Comments User Context   08/08/2016  7:00 PM 08/13/2016  2:20 PM Partial Code [id number]  Awilda Bill, NP Inpatient   08/07/2016  2:53 AM 08/08/2016  7:00 PM Full Code [id number]  Saundra Shelling, MD ED   11/17/2015 12:42 AM 11/18/2015  4:36 PM Full Code [id number]  Quintella Baton, MD Inpatient    Advance Directive Documentation     Most Recent Value  Type of Advance Directive  Living will, Healthcare Power of Attorney  Pre-existing out of facility DNR order (yellow form or pink MOST form)  -  "MOST" Form in Place?  -       Prognosis:   < 2  weeks   Patient is now on 4l O2 is dyspneic at rest, poor po intake   Great symptom management needs  Discharge Planning:  Hospice facility, will write for choice  Care plan was discussed with Dr Eugenia Mcalpine and family and patient   Thank you for allowing the Palliative Medicine Team to assist in the care of this patient.   Time In: 1000 Time Out: 1035 Total Time 35 min Prolonged Time Billed  no       Greater than 50%  of this time was spent counseling and coordinating care related to the above assessment and plan.  Wadie Lessen, NP  Please contact Palliative Medicine Team phone at [phone] for questions and concerns.

## 2016-08-15 DIAGNOSIS — N179 Acute kidney failure, unspecified: Secondary | ICD-10-CM

## 2016-08-15 DIAGNOSIS — I272 Pulmonary hypertension, unspecified: Secondary | ICD-10-CM

## 2016-08-15 DIAGNOSIS — A419 Sepsis, unspecified organism: Secondary | ICD-10-CM

## 2016-08-15 DIAGNOSIS — N189 Chronic kidney disease, unspecified: Secondary | ICD-10-CM

## 2016-08-15 DIAGNOSIS — E8770 Fluid overload, unspecified: Secondary | ICD-10-CM

## 2016-08-15 DIAGNOSIS — J189 Pneumonia, unspecified organism: Secondary | ICD-10-CM

## 2016-08-15 LAB — GLUCOSE, CAPILLARY: Glucose-Capillary: 433 mg/dL — ABNORMAL HIGH (ref 65–99)

## 2016-08-15 MED ORDER — TEMAZEPAM 30 MG PO CAPS: 30.0000 mg | ORAL_CAPSULE | Freq: Every evening | ORAL | 0 refills | Status: AC | PRN

## 2016-08-15 MED ORDER — MENTHOL 3 MG MT LOZG: 1.0000 | LOZENGE | OROMUCOSAL | 12 refills | Status: AC | PRN

## 2016-08-15 MED ORDER — FUROSEMIDE 40 MG PO TABS: 40.0000 mg | ORAL_TABLET | Freq: Every day | ORAL | 0 refills | Status: AC

## 2016-08-15 MED ORDER — LIDOCAINE-PRILOCAINE 2.5-2.5 % EX CREA: TOPICAL_CREAM | CUTANEOUS | 0 refills | Status: AC | PRN

## 2016-08-15 MED ORDER — FUROSEMIDE 40 MG PO TABS
40.0000 mg | ORAL_TABLET | Freq: Every day | ORAL | Status: DC
  Administered 2016-08-15 – 2016-08-17 (×3): 40 mg via ORAL
  Filled 2016-08-15 (×3): qty 1

## 2016-08-15 MED ORDER — MORPHINE SULFATE (CONCENTRATE) 10 MG/0.5ML PO SOLN: 5.0000 mg | ORAL | 0 refills | Status: AC | PRN

## 2016-08-15 MED ORDER — IPRATROPIUM-ALBUTEROL 0.5-2.5 (3) MG/3ML IN SOLN
3.0000 mL | Freq: Once | RESPIRATORY_TRACT | Status: AC
  Administered 2016-08-15: 3 mL via RESPIRATORY_TRACT
  Filled 2016-08-15: qty 3

## 2016-08-15 MED ORDER — LORAZEPAM 1 MG PO TABS: 1.0000 mg | ORAL_TABLET | Freq: Four times a day (QID) | ORAL | 0 refills | Status: AC | PRN

## 2016-08-15 MED ORDER — MORPHINE SULFATE 10 MG/5ML PO SOLN
5.0000 mg | ORAL | Status: DC
  Administered 2016-08-15 – 2016-08-16 (×8): 5 mg via ORAL
  Administered 2016-08-16: 05:00:00 via ORAL
  Administered 2016-08-16 – 2016-08-17 (×4): 5 mg via ORAL
  Filled 2016-08-15 (×12): qty 5

## 2016-08-15 NOTE — Progress Notes (Signed)
Daily Progress Note   Patient Name: Miguel Huber       Date: 08/15/2016 DOB: 1928/05/12  Age: 83 y.o. MRN#: [id number] Attending Physician: Theodoro Grist, MD Primary Care Physician: Viviana Simpler, MD Admit Date: 08/06/2016  Reason for Consultation/Follow-up: Non pain symptom management, Pain control, Psychosocial/spiritual support and Terminal Care  Subjective: Patient awake and alert, breathing hard.  On 5L.  He is very pleasant, but complains of pain in his hips and sacrum.  He requests a breathing treatment (his breathing is the worst right after he eats), and a shave.   Much of what he says I can't understand because of his very labored breathing.  Assessment: 83 yo gentleman with very advanced pulmonary fibrosis.  Off high flow, on 5L but requiring increased amounts of comfort medications to control symptoms.   Patient Profile/HPI: 83 y.o.maleadmitted on 3/14/2018with a known history of Pulmonary fibrosis, type 2 diabetes mellitus, COPD, cardiomyopathy, erectile dysfunction, GERD, hypertension, hyperlipidemia,chronic kidney disease presented to the emergency room after he passed out. He uses home oxygen.He ambulates with the help of a walker at home.   Patient lives with his son at home.( today patient and his daughter Helene Kelp tell mer that [place] the son who lives at home with this patient "is not helpful and is an alcoholic"  Patient was evaluated in the emergency room his WBC count was elevated and blood pressure was on the lower side.Code sepsis was called and patient received IV fluid bolus and broad-spectrum IV antibiotics.Chest x-ray showed interstitial lung disease with infiltrates. Admitted for further evaluation and treatment.  Acute hypoxic respiratory failure on  chronic respiratory failure; secondary to acute bronchitis with superimposed healthcare associated pneumonia and chronic pulmonary fibrosis.    Length of Stay: 8  Current Medications: Scheduled Meds:  . feeding supplement (ENSURE ENLIVE)  237 mL Oral Q24H  . guaiFENesin  600 mg Oral BID  . ipratropium-albuterol  3 mL Nebulization Q6H  . ipratropium-albuterol  3 mL Nebulization Once  . lidocaine  1 patch Transdermal Q24H  . mouth rinse  15 mL Mouth Rinse q12n4p  . methylPREDNISolone (SOLU-MEDROL) injection  40 mg Intravenous Q8H  . morphine  5 mg Oral Q4H    Continuous Infusions:   PRN Meds: acetaminophen **OR** acetaminophen, ipratropium-albuterol, lidocaine-prilocaine, LORazepam, menthol-cetylpyridinium, morphine CONCENTRATE, ondansetron **OR** ondansetron (ZOFRAN)  IV, senna-docusate, trolamine salicylate  Physical Exam       Well developed elderly gentleman A&O +respiratory distress with retractions. Resp:  Decreased breath sounds Abdomen:  Thin, NT  Vital Signs: BP (!) 144/76 (BP Location: Right Arm)   Pulse 82   Temp 98.3 F (36.8 C) (Oral)   Resp 20   Ht 5\' 7"  (1.702 m)   Wt 84.4 kg (186 lb 1.1 oz)   SpO2 93%   BMI 29.14 kg/m  SpO2: SpO2: 93 % O2 Device: O2 Device: Nasal Cannula O2 Flow Rate: O2 Flow Rate (L/min): 5 L/min  Intake/output summary:  Intake/Output Summary (Last 24 hours) at 08/15/16 0941 Last data filed at 08/15/16 0749  Gross per 24 hour  Intake              480 ml  Output             1975 ml  Net            -1495 ml   LBM: Last BM Date: 08/14/16 Baseline Weight: Weight: 84.4 kg (186 lb) Most recent weight: Weight: 84.4 kg (186 lb 1.1 oz)       Palliative Assessment/Data:    Flowsheet Rows     Most Recent Value  Intake Tab  Date first seen by Palliative Care  08/13/16  Clinical Assessment  Psychosocial & Spiritual Assessment  Palliative Care Outcomes      Patient Active Problem List   Diagnosis Date Noted  . Pneumonia  08/15/2016  . Sepsis ([place]) 08/15/2016  . Acute on chronic renal failure ([place]) 08/15/2016  . Fluid overload 08/15/2016  . Moderate to severe pulmonary hypertension 08/15/2016  . Acute on chronic respiratory failure with hypoxia ([place])   . Acute respiratory distress   . Palliative care by specialist   . DNR (do not resuscitate)   . Dyspnea   . Syncope 08/07/2016  . Hypotension 12/07/2015  . Chronic diastolic heart failure ([place]) 11/26/2015  . COPD (chronic obstructive pulmonary disease) ([place]) 11/16/2015  . Postinflammatory pulmonary fibrosis ([place]) 11/16/2015  . History of DVT (deep vein thrombosis) 11/16/2015  . Cardiomyopathy ([place]) 11/16/2015  . Chronic hypoxemic respiratory failure ([place]) 08/20/2015  . Routine general medical examination at a health care facility 08/02/2013  . Neuropathy ([place]) 08/02/2013  . Constipation, chronic 10/10/2011  . Type 2 diabetes, controlled, with renal manifestation ([place]) 06/06/2011  . Hypertension   . UNSPECIFIED VENOUS INSUFFICIENCY 08/14/2010  . OSTEOARTHRITIS 04/09/2010  . Cough 01/09/2010  . BACK PAIN, LUMBAR, WITH RADICULOPATHY 06/01/2008  . Chronic kidney disease, stage IV (severe) ([place]) 04/20/2007  . Hyperlipemia 10/13/2006  . ALLERGIC RHINITIS 10/13/2006  . COPD (chronic obstructive pulmonary disease) with emphysema ([place]) 10/13/2006  . GERD 10/13/2006    Palliative Care Plan    Recommendations/Plan:  No escalation of care  No life prolonging measures  Symptom management to enhance comfort and dignity.   Scheduled PO morphine for dyspnea  PRN morphine for break thru dyspnea  Air overlay mattress to prevent pressure points and pain  Ativan for agitation  Hopeful for a hospice facility  Goals of Care and Additional Recommendations:  Limitations on Scope of Treatment: Avoid Hospitalization, Full Comfort Care, Minimize Medications, No Diagnostics, No IV Antibiotics, No IV Fluids and No Lab Draws  Code Status:   DNR  Prognosis:   < 2 weeks   Discharge Planning:  Hospice facility  Care plan was discussed with Bedside RN, Case Manager.  Thank you for allowing the Palliative  Medicine Team to assist in the care of this patient.  Total time spent:  25 min.     Greater than 50%  of this time was spent counseling and coordinating care related to the above assessment and plan.  Imogene Burn, PA-C Palliative Medicine  Please contact Palliative MedicineTeam phone at [phone] for questions and concerns between 7 am - 7 pm.   Please see AMION for individual provider pager numbers.

## 2016-08-15 NOTE — Progress Notes (Signed)
[facility] [place] NAME: Miguel Huber    MR#:  [id number]  DATE OF BIRTH:  03-18-1928  SUBJECTIVE:  CHIEF COMPLAINT:  Patient's shortness of breath is Better today and patient was placed on high flow oxygen through nasal cannulas, now on 50% of FiO2. He lives on 2 L of oxygen at home for chronic pulmonary fibrosis. The patient was seen by pulmonologist as recommended to continue  steroids, The patient was initiated on Lasix , diuresed , feels somewhat better, O2 sats requirement decreased from 65% to 4 l;iters today. He feels the same today, complains of some shortness of breath, the patient is not sure if he was going to improve. The patient was seen by palliative care, hospice home was recommended, Did not get discharged to hospice home, since no beds were available Feels comfortable today, admits of some shortness of breath, is receiving morphine as needed. Remains on 5 L of oxygen REVIEW OF SYSTEMS:  CONSTITUTIONAL: No fever, fatigue or weakness.  EYES: No blurred or double vision.  EARS, NOSE, AND THROAT: No tinnitus or ear pain.  RESPIRATORY: Positive cough,Worsening of shortness of breath Today, denies wheezing or hemoptysis.  CARDIOVASCULAR: No chest pain, orthopnea, edema.  GASTROINTESTINAL: No nausea, vomiting, diarrhea or abdominal pain.  GENITOURINARY: No dysuria, hematuria.  ENDOCRINE: No polyuria, nocturia,  HEMATOLOGY: No anemia, easy bruising or bleeding SKIN: No rash or lesion. MUSCULOSKELETAL: No joint pain or arthritis.   NEUROLOGIC: No tingling, numbness, weakness.  PSYCHIATRY: No anxiety or depression.   DRUG ALLERGIES:   Allergies  Allergen Reactions  . Theophyllines Hives    VITALS:  Blood pressure (!) 144/76, pulse 82, temperature 98.3 F (36.8 C), temperature source Oral, resp. rate 20, height 5\' 7"  (1.702 m), weight 84.4 kg (186 lb 1.1 oz), SpO2 94 %.  PHYSICAL EXAMINATION:  GENERAL:  83  y.o.-year-old patient lying in the bed In mild respiratory distress, Relatively comfortable today, unless on exertion, and speech  EYES: Pupils equal, round, reactive to light and accommodation. No scleral icterus. Extraocular muscles intact.  HEENT: Head atraumatic, normocephalic. Oropharynx and nasopharynx clear.  NECK:  Supple, no jugular venous distention. No thyroid enlargement, no tenderness.  LUNGS: Relatively good air entrance bilaterally, , no wheezing,  rales,rhonchi, intermittently using  accessory muscles of respiration CARDIOVASCULAR: S1, S2 normal. No murmurs, rubs, or gallops.  ABDOMEN: Soft, nontender, nondistended. Bowel sounds present. No organomegaly or mass.  EXTREMITIES: No pedal edema, cyanosis, or clubbing.  NEUROLOGIC: Cranial nerves II through XII are intact. Muscle strength 5/5 in all extremities. Sensation intact. Gait not checked.  PSYCHIATRIC: The patient is alert and oriented x 3.  SKIN: No obvious rash, lesion, or ulcer.    LABORATORY PANEL:   CBC  Recent Labs Lab 08/14/16 0527  WBC 16.8*  HGB 14.1  HCT 41.5  PLT 250   ------------------------------------------------------------------------------------------------------------------  Chemistries   Recent Labs Lab 08/13/16 0049 08/14/16 0527  NA 145 141  K 4.8 5.0  CL 114* 109  CO2 22 25  GLUCOSE 160* 179*  BUN 61* 59*  CREATININE 2.04* 1.89*  CALCIUM 8.5* 8.3*  MG 2.8*  --    ------------------------------------------------------------------------------------------------------------------  Cardiac Enzymes No results for input(s): TROPONINI in the last 168 hours. ------------------------------------------------------------------------------------------------------------------  RADIOLOGY:  No results found.  EKG:   Orders placed or performed during the hospital encounter of 08/06/16  . ED EKG  . ED EKG  . EKG 12-Lead  . EKG 12-Lead  ASSESSMENT AND PLAN:   83 year old elderly  male patient with history of cardiomyopathy, chronic kidney disease, hypertension, hyperlipidemia, pulmonary fibrosis, COPD presented to the emergency room with an episode of passing out.  #Acute hypoxic respiratory failure on chronic respiratory failure; secondary to acute bronchitis with superimposed healthcare associated pneumonia and chronic pulmonary fibrosis. He improved clinically with diuresis, FiO2 decreased from 65% to 5 liters , appreciate pulmonology's input,, continue   Nebulizer treatments,  unable to get sputum cultures. Patient by palliative care again and decided on DO NOT RESUSCITATE,  patient is not to be escalated to high flow nasal cannulas anymore.  Blood cultures are negative. Continue Lasix intermittently, follow ins and outs, oxygenation, discharge to hospice home as soon as possible is available  #Sepis- from acute bronchitis with superimposed healthcare associated pneumonia  Patient was treated initially on  IV Zosyn,  nowoff antibiotics.  Vancomycin was discontinued due to MRSA PCR being negative, remains afebrile, unable to get sputum cultures, Blood cultures negative for 5 days Urine culture with no growth Repeat chest x-ray 3/16 with superimposed pneumonia on the right side Leukocytosis has improved. The patient does have difficulty swallowing, coughing with swallowing, diet was downgraded to dysphagia with thickened liquids, could be related to recurrent aspirations.   #. Syncope secondary to Hypotension/vasovagal versus hypoxia episode versus acute cor pulmonale, improved on IV fluids. PT evaluated patient and recommended skilled nursing facility placement for rehabilitation. Acute MI ruled out with negative troponins. Lactic acid is normal. Patient was seen by palliative care, hospice home was recommended, patient was agreeable, discharge as soon as bed is available   #.  acute on chronic renal failure, known CKD stage III, improved with IV fluids , now off IV fluids  and on Lasix, creatinine is some better, at  baseline  Baseline creatinine seems to be at 1.87, and 1.89 yesterday Creatinine 2.48--2.2 --1.96-1.79- 2.01-1.88-1.8-2.04- 1.89. Avoid nephrotoxins check a.m.labs after the diuresis  # .  chronic Pulmonary fibrosis  follow-up with pulmonology, continue  oxygen, will not escalate to high flow again, patient will be discharged to hospice home when bed is available  # Diabetes mellitus with significant Hyperglycemia due to steroids,  now off insulin and sliding scale.    All the records are reviewed and case discussed with Care Management/Social Workerr. Management plans discussed with the patient, he is in agreement.  CODE STATUS: FC TOTAL CRITICAL CARE TIME TAKING CARE OF THIS PATIENT: 35 minutes.   POSSIBLE D/C IN 1-2  DAYS, DEPENDING ON CLINICAL CONDITION.  Note: This dictation was prepared with Dragon dictation along with smaller phrase technology. Any transcriptional errors that result from this process are unintentional.   Matix Henshaw M.D on 08/15/2016 at 4:17 PM  Between 7am to 6pm - Pager - [phone] After 6pm go to [email or web address] - password [email or web address] [facility]  Office  [phone]  CC: Primary care physician; Viviana Simpler, MD

## 2016-08-15 NOTE — Progress Notes (Signed)
Patient is currently on the [facility] waiting list. Clinical Social Worker (CSW) will continue to follow and assist as needed.   Alicea Wente, LCSW [phone] 

## 2016-08-15 NOTE — Progress Notes (Signed)
Follow up on new hospice home referral who is currently on the waiting list for a bed.  Miguel Huber is sitting up in the bed watching television.  He is alert and oriented to self and place.  Updated him on his current status on hospice home waitlist.  He is very dyspnic at rest with oxygen infusing.  He asked for help with meal set up.  I assisted him with meal set up and left him eating his lunch.  Notified Doctor, hospital.  Will continue to follow through final disposition. Team at [facility] aware of continued wait list status.

## 2016-08-15 NOTE — Discharge Summary (Addendum)
[facility] NAME: Miguel Huber    MR#:  [id number]  DATE OF BIRTH:  1927/08/08  DATE OF ADMISSION:  08/06/2016 ADMITTING PHYSICIAN: Saundra Shelling, MD  DATE OF DISCHARGE: No discharge date for patient encounter.  PRIMARY CARE PHYSICIAN: Viviana Simpler, MD     ADMISSION DIAGNOSIS:  Hypotension, unspecified hypotension type [I95.9] Syncope, unspecified syncope type [R55] Leukocytosis, unspecified type [D72.829]  DISCHARGE DIAGNOSIS:  Principal Problem:   Acute on chronic respiratory failure with hypoxia (HCC) Active Problems:   Acute respiratory distress   Dyspnea   Moderate to severe pulmonary hypertension   Syncope   Pneumonia   Sepsis ([place])   Acute on chronic renal failure (HCC)   Fluid overload   Palliative care by specialist   DNR (do not resuscitate)   SECONDARY DIAGNOSIS:   Past Medical History:  Diagnosis Date  . Allergic rhinitis   . Cardiomyopathy   . COPD (chronic obstructive pulmonary disease) ([place])   . Diabetes mellitus   . DVT (deep venous thrombosis), right 5/13  . Erectile dysfunction   . GERD (gastroesophageal reflux disease)   . Hx of adenomatous colonic polyps   . Hyperlipidemia   . Hypertension   . Osteoarthritis   . Pulmonary fibrosis ([place])   . Renal insufficiency     .pro HOSPITAL COURSE:   The patient is 83 year old Caucasian male with medical history significant for history of chronic respiratory failure, pulmonary fibrosis, who presents to the hospital with complaints of syncopal episode. He was brought to emergency room where he was found to have elevated white blood cell count and hypotension. Code sepsis was called and patient received IV fluids and broad-spectrum antibiotic therapy. Chest x-ray revealed interstitial lung disease with infiltrates. He was admitted to the hospital, his condition deteriorated and he became progressively hypoxic, requiring ICU admission.  He was placed on BiPAP, high flow nasal cannulas, he was treated with broad-spectrum antibiotic therapy, pulmonology consultation was obtained. His blood cultures remained negative, MRSA PCR was negative, urine culture was negative. When his blood pressure stabilized, he was found to be fluid overloaded and diuresed. He was felt to have dysphagia with some aspiration , he was evaluated by speech therapist and recommended dysphagia 1 diet with thin liquids which he tolerated well. With conservative therapy. His condition improved and he was weaned down to 5 L of oxygen through nasal cannula, however, remained short of breath, dyspneic, palliative care consultation was obtained, patient chose palliation, hospice home discharge is being planned. Patient will be discharged when bed is available.  Discussion by problem:  #Acute hypoxic respiratory failure on chronic respiratory failure; secondary to acute bronchitis with superimposed healthcare associated pneumonia and chronic pulmonary fibrosis. He improved clinically with diuresis, FiO2 decreased from 65% to 5 liters , appreciate pulmonology's input,, continue   Nebulizer treatments,  unable to get sputum cultures. Patient seen by palliative care again and decided on DO NOT RESUSCITATE, Discharge to hospice home when bed is available.   #.Sepsis- from acute bronchitis with superimposed healthcare associated pneumonia . The patient was treated with broad-spectrum antibiotic therapy and improved. Blood cultures negative for 5 days Urine culture with no growth Repeat chest x-ray 3/16 with superimposed pneumonia on the right side. Leukocytosis has some improved. The patient does have difficulty swallowing, coughing with swallowing, diet was downgraded to dysphagia 1 with thickened liquids, could be related to recurrent aspirations. The patient has been stabilized, continues to have  shortness of breath, dyspnea, requiring morphine administration intermittently. He  is going to be discharged to hospice home when bed is available.  #.Syncope secondary to Hypotension/vasovagal versus hypoxia versus cor pulmonale/shock, improved on IV fluids. Acute MI ruled out with negative troponins. Lactic acid is normal. Patient was seen by palliative care, hospice home was recommended, patient is agreeable, discharge when bed is ready  #. acute on chronic renal failure, known CKD stage III, improved with IV fluids , now off IV fluids and back on Lasix, creatinine is  at  baseline   # . chronic Pulmonary fibrosis  follow-up with pulmonology, weaned off high flow oxygen, will not escalate to high flow again, patient will be discharged to hospice home when bed is available  # Diabetes mellitus with Hyperglycemia due to steroids,  now off insulin and sliding scale. Continue diet as able. Steroids are discontinued.   DISCHARGE CONDITIONS:   poor  CONSULTS OBTAINED:  Treatment Team:  Allyne Gee, MD  DRUG ALLERGIES:   Allergies  Allergen Reactions  . Theophyllines Hives    DISCHARGE MEDICATIONS:   Current Discharge Medication List    START taking these medications   Details  lidocaine-prilocaine (EMLA) cream Apply topically as needed (Left rib cage). Qty: 30 g, Refills: 0    LORazepam (ATIVAN) 1 MG tablet Take 1 tablet (1 mg total) by mouth every 6 (six) hours as needed for anxiety. Qty: 30 tablet, Refills: 0    menthol-cetylpyridinium (CEPACOL) 3 MG lozenge Take 1 lozenge (3 mg total) by mouth as needed for sore throat. Qty: 100 tablet, Refills: 12    Morphine Sulfate (MORPHINE CONCENTRATE) 10 MG/0.5ML SOLN concentrated solution Take 0.25 mLs (5 mg total) by mouth every hour as needed for severe pain or shortness of breath. Qty: 30 mL, Refills: 0      CONTINUE these medications which have CHANGED   Details  furosemide (LASIX) 40 MG tablet Take 1 tablet (40 mg total) by mouth daily. Qty: 30 tablet, Refills: 0    temazepam (RESTORIL) 30 MG  capsule Take 1 capsule (30 mg total) by mouth at bedtime as needed for sleep. Qty: 12 capsule, Refills: 0      CONTINUE these medications which have NOT CHANGED   Details  ipratropium-albuterol (DUONEB) 0.5-2.5 (3) MG/3ML SOLN Take 3 mLs by nebulization every 6 (six) hours as needed (for wheezing/shortness of breath).       STOP taking these medications     aspirin EC 81 MG tablet      cholecalciferol (VITAMIN D) 1000 units tablet      glucose blood (PRODIGY NO CODING BLOOD GLUC) test strip      Insulin Syringe-Needle U-100 (B-D INS SYRINGE 0.5CC/30GX1/2") 30G X 1/2" 0.5 ML MISC      lisinopril (PRINIVIL,ZESTRIL) 10 MG tablet      NOVOLIN N 100 UNIT/ML injection      NOVOLIN R 100 UNIT/ML injection      omeprazole (PRILOSEC) 20 MG capsule      rosuvastatin (CRESTOR) 20 MG tablet          DISCHARGE INSTRUCTIONS:    No follow-up  If you experience worsening of your admission symptoms, develop shortness of breath, life threatening emergency, suicidal or homicidal thoughts you must seek medical attention immediately by calling 911 or calling your MD immediately  if symptoms less severe.  You Must read complete instructions/literature along with all the possible adverse reactions/side effects for all the Medicines you take and that have  been prescribed to you. Take any new Medicines after you have completely understood and accept all the possible adverse reactions/side effects.   Please note  You were cared for by a hospitalist during your hospital stay. If you have any questions about your discharge medications or the care you received while you were in the hospital after you are discharged, you can call the unit and asked to speak with the hospitalist on call if the hospitalist that took care of you is not available. Once you are discharged, your primary care physician will handle any further medical issues. Please note that NO REFILLS for any discharge medications will be  authorized once you are discharged, as it is imperative that you return to your primary care physician (or establish a relationship with a primary care physician if you do not have one) for your aftercare needs so that they can reassess your need for medications and monitor your lab values.    Today   CHIEF COMPLAINT:   Chief Complaint  Patient presents with  . Loss of Consciousness    HISTORY OF PRESENT ILLNESS:  Zadyn Yardley  is a 83 y.o. male with a known history of chronic respiratory failure, pulmonary fibrosis, who presents to the hospital with complaints of syncopal episode. He was brought to emergency room where he was found to have elevated white blood cell count and hypotension. Code sepsis was called and patient received IV fluids and broad-spectrum antibiotic therapy. Chest x-ray revealed interstitial lung disease with infiltrates. He was admitted to the hospital, his condition deteriorated and he became progressively hypoxic, requiring ICU admission. He was placed on BiPAP, high flow nasal cannulas, he was treated with broad-spectrum antibiotic therapy, pulmonology consultation was obtained. His blood cultures remained negative, MRSA PCR was negative, urine culture was negative. When his blood pressure stabilized, he was found to be fluid overloaded and diuresed. He was felt to have dysphagia with some aspiration , he was evaluated by speech therapist and recommended dysphagia 1 diet with thin liquids which he tolerated well. With conservative therapy. His condition improved and he was weaned down to 5 L of oxygen through nasal cannula, however, remained short of breath, dyspneic, palliative care consultation was obtained, patient chose palliation, hospice home discharge is being planned. Patient will be discharged when bed is available.  Discussion by problem:  #Acute hypoxic respiratory failure on chronic respiratory failure; secondary to acute bronchitis with superimposed  healthcare associated pneumonia and chronic pulmonary fibrosis. He improved clinically with diuresis, FiO2 decreased from 65% to 5 liters , appreciate pulmonology's input,, continue   Nebulizer treatments,  unable to get sputum cultures. Patient seen by palliative care again and decided on DO NOT RESUSCITATE, Discharge to hospice home when bed is available.   #.Sepsis- from acute bronchitis with superimposed healthcare associated pneumonia . The patient was treated with broad-spectrum antibiotic therapy and improved. Blood cultures negative for 5 days Urine culture with no growth Repeat chest x-ray 3/16 with superimposed pneumonia on the right side. Leukocytosis has some improved. The patient does have difficulty swallowing, coughing with swallowing, diet was downgraded to dysphagia 1 with thickened liquids, could be related to recurrent aspirations. The patient has been stabilized, continues to have shortness of breath, dyspnea, requiring morphine administration intermittently. He is going to be discharged to hospice home when bed is available.  #.Syncope secondary to Hypotension/vasovagal versus hypoxia versus cor pulmonale/shock, improved on IV fluids. Acute MI ruled out with negative troponins. Lactic acid is normal. Patient was  seen by palliative care, hospice home was recommended, patient is agreeable, discharge when bed is ready  #. acute on chronic renal failure, known CKD stage III, improved with IV fluids , now off IV fluids and back on Lasix, creatinine is  at  baseline   # . chronic Pulmonary fibrosis  follow-up with pulmonology, weaned off high flow oxygen, will not escalate to high flow again, patient will be discharged to hospice home when bed is available  # Diabetes mellitus with Hyperglycemia due to steroids,  now off insulin and sliding scale. Continue diet as able. Steroids are discontinued.     VITAL SIGNS:  Blood pressure (!) 144/76, pulse 82, temperature 98.3 F  (36.8 C), temperature source Oral, resp. rate 20, height 5\' 7"  (1.702 m), weight 84.4 kg (186 lb 1.1 oz), SpO2 93 %.  I/O:    Intake/Output Summary (Last 24 hours) at 08/15/16 0904 Last data filed at 08/15/16 0749  Gross per 24 hour  Intake              480 ml  Output             2275 ml  Net            -1795 ml    PHYSICAL EXAMINATION:  GENERAL:  83 y.o.-year-old patient lying in the bed with no acute distress.  EYES: Pupils equal, round, reactive to light and accommodation. No scleral icterus. Extraocular muscles intact.  HEENT: Head atraumatic, normocephalic. Oropharynx and nasopharynx clear.  NECK:  Supple, no jugular venous distention. No thyroid enlargement, no tenderness.  LUNGS: Normal breath sounds bilaterally, no wheezing, rales,rhonchi or crepitation. No use of accessory muscles of respiration.  CARDIOVASCULAR: S1, S2 normal. No murmurs, rubs, or gallops.  ABDOMEN: Soft, non-tender, non-distended. Bowel sounds present. No organomegaly or mass.  EXTREMITIES: No pedal edema, cyanosis, or clubbing.  NEUROLOGIC: Cranial nerves II through XII are intact. Muscle strength 5/5 in all extremities. Sensation intact. Gait not checked.  PSYCHIATRIC: The patient is alert and oriented x 3.  SKIN: No obvious rash, lesion, or ulcer.   DATA REVIEW:   CBC  Recent Labs Lab 08/14/16 0527  WBC 16.8*  HGB 14.1  HCT 41.5  PLT 250    Chemistries   Recent Labs Lab 08/13/16 0049 08/14/16 0527  NA 145 141  K 4.8 5.0  CL 114* 109  CO2 22 25  GLUCOSE 160* 179*  BUN 61* 59*  CREATININE 2.04* 1.89*  CALCIUM 8.5* 8.3*  MG 2.8*  --     Cardiac Enzymes No results for input(s): TROPONINI in the last 168 hours.  Microbiology Results  Results for orders placed or performed during the hospital encounter of 08/06/16  Blood culture (routine x 2)     Status: None   Collection Time: 08/06/16 10:17 PM  Result Value Ref Range Status   Specimen Description BLOOD RIGHT ANTECUBITAL   Final   Special Requests BOTTLES DRAWN AEROBIC AND ANAEROBIC BCAV  Final   Culture NO GROWTH 5 DAYS  Final   Report Status 08/11/2016 FINAL  Final  Blood culture (routine x 2)     Status: None   Collection Time: 08/06/16 10:17 PM  Result Value Ref Range Status   Specimen Description BLOOD LEFT ANTECUBITAL  Final   Special Requests BOTTLES DRAWN AEROBIC AND ANAEROBIC BCAV  Final   Culture NO GROWTH 5 DAYS  Final   Report Status 08/11/2016 FINAL  Final  Urine culture     Status:  None   Collection Time: 08/06/16 10:17 PM  Result Value Ref Range Status   Specimen Description URINE, RANDOM  Final   Special Requests NONE  Final   Culture   Final    NO GROWTH Performed at [facility], [address] [place],  [address]    Report Status 08/08/2016 FINAL  Final  MRSA PCR Screening     Status: None   Collection Time: 08/08/16 11:39 AM  Result Value Ref Range Status   MRSA by PCR NEGATIVE NEGATIVE Final    Comment:        The GeneXpert MRSA Assay (FDA approved for NASAL specimens only), is one component of a comprehensive MRSA colonization surveillance program. It is not intended to diagnose MRSA infection nor to guide or monitor treatment for MRSA infections.     RADIOLOGY:  No results found.  EKG:   Orders placed or performed during the hospital encounter of 08/06/16  . ED EKG  . ED EKG  . EKG 12-Lead  . EKG 12-Lead      Management plans discussed with the patient, family and they are in agreement.  CODE STATUS:     Code Status Orders        Start     Ordered   08/13/16 1421  Do not attempt resuscitation (DNR)  Continuous    Question Answer Comment  In the event of cardiac or respiratory ARREST Do not call a "code blue"   In the event of cardiac or respiratory ARREST Do not perform Intubation, CPR, defibrillation or ACLS   In the event of cardiac or respiratory ARREST Use medication by any route, position, wound care, and other measures to  relive pain and suffering. May use oxygen, suction and manual treatment of airway obstruction as needed for comfort.      08/13/16 1420    Code Status History    Date Active Date Inactive Code Status Order ID Comments User Context   08/08/2016  7:00 PM 08/13/2016  2:20 PM Partial Code [id number]  Awilda Bill, NP Inpatient   08/07/2016  2:53 AM 08/08/2016  7:00 PM Full Code [id number]  Saundra Shelling, MD ED   11/17/2015 12:42 AM 11/18/2015  4:36 PM Full Code [id number]  Quintella Baton, MD Inpatient    Advance Directive Documentation     Most Recent Value  Type of Advance Directive  Living will, Healthcare Power of Attorney  Pre-existing out of facility DNR order (yellow form or pink MOST form)  -  "MOST" Form in Place?  -      TOTAL TIME TAKING CARE OF THIS PATIENT: 40 minutes.    Theodoro Grist M.D on 08/15/2016 at 9:04 AM  Between 7am to 6pm - Pager - [phone]  After 6pm go to [email or web address] - password [email or web address] [facility]  Office  [phone]  CC: Primary care physician; Viviana Simpler, MD

## 2016-08-16 MED ORDER — ALUM & MAG HYDROXIDE-SIMETH 200-200-20 MG/5ML PO SUSP
30.0000 mL | ORAL | Status: DC | PRN
  Administered 2016-08-16: 23:00:00 30 mL via ORAL
  Filled 2016-08-16: qty 30

## 2016-08-16 NOTE — Progress Notes (Signed)
[facility] NAME: Miguel Huber    MR#:  [id number]  DATE OF BIRTH:  Jun 16, 1927  SUBJECTIVE:  CHIEF COMPLAINT:   Patient on 5 L oxygen sitting at the side of the bed. He is not able to complete sentences due to his dyspnea    REVIEW OF SYSTEMS:  CONSTITUTIONAL: No fever, fatigue or weakness.  EYES: No blurred or double vision.  EARS, NOSE, AND THROAT: No tinnitus or ear pain.  RESPIRATORY: Positive cough, postive shortness of breath , denies wheezing or hemoptysis.  CARDIOVASCULAR: No chest pain, orthopnea, edema.  GASTROINTESTINAL: No nausea, vomiting, diarrhea or abdominal pain.  GENITOURINARY: No dysuria, hematuria.  ENDOCRINE: No polyuria, nocturia,  HEMATOLOGY: No anemia, easy bruising or bleeding SKIN: No rash or lesion. MUSCULOSKELETAL: No joint pain or arthritis.   NEUROLOGIC: No tingling, numbness, weakness.  PSYCHIATRY: No anxiety or depression.   DRUG ALLERGIES:   Allergies  Allergen Reactions  . Theophyllines Hives    VITALS:  Blood pressure (!) 129/112, pulse 78, temperature 98.6 F (37 C), resp. rate (!) 28, height 5\' 7"  (1.702 m), weight 186 lb 1.1 oz (84.4 kg), SpO2 94 %.  PHYSICAL EXAMINATION:  GENERAL:  83 y.o.-year-old patient lying in the bed In mild respiratory distress,  EYES: Pupils equal, round, reactive to light and accommodation. No scleral icterus. Extraocular muscles intact.  HEENT: Head atraumatic, normocephalic. Oropharynx and nasopharynx clear.  NECK:  Supple, no jugular venous distention. No thyroid enlargement, no tenderness.  LUNGS: decreased bs billatreallly, intermittently using  accessory muscles of respiration CARDIOVASCULAR: S1, S2 normal. No murmurs, rubs, or gallops.  ABDOMEN: Soft, nontender, nondistended. Bowel sounds present. No organomegaly or mass.  EXTREMITIES: No pedal edema, cyanosis, or clubbing.  NEUROLOGIC: Cranial nerves II through XII are intact. Muscle  strength 5/5 in all extremities. Sensation intact. Gait not checked.  PSYCHIATRIC: The patient is alert and oriented x 3.  SKIN: No obvious rash, lesion, or ulcer.    LABORATORY PANEL:   CBC  Recent Labs Lab 08/14/16 0527  WBC 16.8*  HGB 14.1  HCT 41.5  PLT 250   ------------------------------------------------------------------------------------------------------------------  Chemistries   Recent Labs Lab 08/13/16 0049 08/14/16 0527  NA 145 141  K 4.8 5.0  CL 114* 109  CO2 22 25  GLUCOSE 160* 179*  BUN 61* 59*  CREATININE 2.04* 1.89*  CALCIUM 8.5* 8.3*  MG 2.8*  --    ------------------------------------------------------------------------------------------------------------------  Cardiac Enzymes No results for input(s): TROPONINI in the last 168 hours. ------------------------------------------------------------------------------------------------------------------  RADIOLOGY:  No results found.  EKG:   Orders placed or performed during the hospital encounter of 08/06/16  . ED EKG  . ED EKG  . EKG 12-Lead  . EKG 12-Lead    ASSESSMENT AND PLAN:   83 year old elderly male patient with history of cardiomyopathy, chronic kidney disease, hypertension, hyperlipidemia, pulmonary fibrosis, COPD presented to the emergency room with an episode of passing out.  #Acute hypoxic respiratory failure on chronic respiratory failure; secondary to acute bronchitis with superimposed healthcare associated pneumonia and chronic pulmonary fibrosis.  No significant improvement in his condition. Was seen by palliative care plan for him to go to hospice home   #[place]- from acute bronchitis with superimposed healthcare associated pneumonia  Patient was treated initially on  IV Zosyn,  nowoff antibiotics.  Vancomycin was discontinued due to MRSA PCR being negative, remains afebrile, unable to get sputum cultures, Blood cultures negative for 5 days Urine culture with no  growth Antibiotics have been stopped and patient will be going to hospice home   #. Syncope secondary to Hypotension/vasovagal versus hypoxia episode versus acute cor pulmonale, improved on IV fluids. No further w/u needed  #.  acute on chronic renal failure, known CKD stage III supportive care  # .  chronic Pulmonary fibrosis poor prognosis, hospices home when bed available  # Diabetes mellitus with significant Hyperglycemia due to steroids,  now off insulin and sliding scale.    All the records are reviewed and case discussed with Care Management/Social Workerr. Management plans discussed with the patient, he is in agreement.  CODE STATUS: FC TOTAL  TIME TAKING CARE OF THIS PATIENT:50minutes.   POSSIBLE D/C IN 1-2  DAYS, DEPENDING ON hospices facility bed availability  Note: This dictation was prepared with Dragon dictation along with smaller phrase technology. Any transcriptional errors that result from this process are unintentional.   Dustin Flock M.D on 08/16/2016 at 11:30 AM  Between 7am to 6pm - Pager - [phone] After 6pm go to [email or web address] - password [email or web address] [facility]  Office  [phone]  CC: Primary care physician; Viviana Simpler, MD

## 2016-08-16 NOTE — Clinical Social Work Note (Signed)
CSW spoke with Jackelyn Poling, RN admissions coordinator for [facility]. The patient is on the waiting list with at least 2 patients ahead. The patient will most likely not be able to dc this weekend; however, CSW is following in case a bed becomes available.  Santiago Bumpers, MSW, Latanya Presser [phone]

## 2016-08-16 NOTE — Plan of Care (Signed)
Problem: Activity: Goal: Risk for activity intolerance will decrease Outcome: Not Progressing Patient is comfort care.  Problem: Respiratory: Goal: Respiratory status will improve Outcome: Not Progressing Patient is comfort care  Comments: Patient is alert and oriented. VSS. Remains on 5L oxygen. Patient is comfort care and will discharge to hospice home when bed is available.

## 2016-08-17 MED ORDER — MORPHINE SULFATE 10 MG/5ML PO SOLN: 5.0000 mg | ORAL | 0 refills | Status: AC

## 2016-08-17 NOTE — Progress Notes (Signed)
[facility] [place] NAME: Miguel Huber    MR#:  [id number]  DATE OF BIRTH:  [birth year]  SUBJECTIVE:  CHIEF COMPLAINT:   Still very dyspenic    REVIEW OF SYSTEMS:  CONSTITUTIONAL: No fever, fatigue or weakness.  EYES: No blurred or double vision.  EARS, NOSE, AND THROAT: No tinnitus or ear pain.  RESPIRATORY: Positive cough, postive shortness of breath , denies wheezing or hemoptysis.  CARDIOVASCULAR: No chest pain, orthopnea, edema.  GASTROINTESTINAL: No nausea, vomiting, diarrhea or abdominal pain.  GENITOURINARY: No dysuria, hematuria.  ENDOCRINE: No polyuria, nocturia,  HEMATOLOGY: No anemia, easy bruising or bleeding SKIN: No rash or lesion. MUSCULOSKELETAL: No joint pain or arthritis.   NEUROLOGIC: No tingling, numbness, weakness.  PSYCHIATRY: No anxiety or depression.   DRUG ALLERGIES:   Allergies  Allergen Reactions  . Theophyllines Hives    VITALS:  Blood pressure 126/71, pulse 89, temperature 97.7 F (36.5 C), temperature source Oral, resp. rate (!) 24, height 5\' 7"  (1.702 m), weight 186 lb 1.1 oz (84.4 kg), SpO2 90 %.  PHYSICAL EXAMINATION:  GENERAL:  83 y.o.-year-old patient lying in the bed In mild respiratory distress,  EYES: Pupils equal, round, reactive to light and accommodation. No scleral icterus. Extraocular muscles intact.  HEENT: Head atraumatic, normocephalic. Oropharynx and nasopharynx clear.  NECK:  Supple, no jugular venous distention. No thyroid enlargement, no tenderness.  LUNGS: decreased bs billatreallly, intermittently using  accessory muscles of respiration CARDIOVASCULAR: S1, S2 normal. No murmurs, rubs, or gallops.  ABDOMEN: Soft, nontender, nondistended. Bowel sounds present. No organomegaly or mass.  EXTREMITIES: No pedal edema, cyanosis, or clubbing.  NEUROLOGIC: Cranial nerves II through XII are intact. Muscle strength 5/5 in all extremities. Sensation intact. Gait not checked.   PSYCHIATRIC: The patient is alert and oriented x 3.  SKIN: No obvious rash, lesion, or ulcer.    LABORATORY PANEL:   CBC  Recent Labs Lab 08/14/16 0527  WBC 16.8*  HGB 14.1  HCT 41.5  PLT 250   ------------------------------------------------------------------------------------------------------------------  Chemistries   Recent Labs Lab 08/13/16 0049 08/14/16 0527  NA 145 141  K 4.8 5.0  CL 114* 109  CO2 22 25  GLUCOSE 160* 179*  BUN 61* 59*  CREATININE 2.04* 1.89*  CALCIUM 8.5* 8.3*  MG 2.8*  --    ------------------------------------------------------------------------------------------------------------------  Cardiac Enzymes No results for input(s): TROPONINI in the last 168 hours. ------------------------------------------------------------------------------------------------------------------  RADIOLOGY:  No results found.  EKG:   Orders placed or performed during the hospital encounter of 08/06/16  . ED EKG  . ED EKG  . EKG 12-Lead  . EKG 12-Lead    ASSESSMENT AND PLAN:   83 year old elderly male patient with history of cardiomyopathy, chronic kidney disease, hypertension, hyperlipidemia, pulmonary fibrosis, COPD presented to the emergency room with an episode of passing out.  #Acute hypoxic respiratory failure on chronic respiratory failure; secondary to acute bronchitis with superimposed healthcare associated pneumonia and chronic pulmonary fibrosis.  No significant improvement in his condition. Still very short of breath Was seen by palliative care plan for him to go to hospice home   #Sepis- from acute bronchitis with superimposed healthcare associated pneumonia  Patient was treated initially on  IV Zosyn,  Now off antibiotics.  Vancomycin was discontinued due to MRSA PCR being negative, remains afebrile, unable to get sputum cultures, Blood cultures negative for 5 days Urine culture with no growth Antibiotics have been stopped and  patient will be going to  hospice home once better available   #. Syncope secondary to Hypotension/vasovagal versus hypoxia episode versus acute cor pulmonale, improved on IV fluids. No further w/u needed  #.  acute on chronic renal failure, known CKD stage III supportive care  # .  chronic Pulmonary fibrosis poor prognosis, hospices home when bed available  # Diabetes mellitus with significant Hyperglycemia due to steroids,  now off insulin and sliding scale.    All the records are reviewed and case discussed with Care Management/Social Workerr. Management plans discussed with the patient, he is in agreement.  CODE STATUS: FC TOTAL  TIME TAKING CARE OF THIS PATIENT:56minutes.   Awaiting bed in a hospice home  Posey Pronto [facility] M.D on 08/17/2016 at 1:29 PM  Between 7am to 6pm - Pager - [phone] After 6pm go to [email or web address] - password [email or web address] [email or web address]  Office  [phone]  CC: Primary care physician; Viviana Simpler, MD

## 2016-08-17 NOTE — Progress Notes (Signed)
Pt being discharged to hospice home, report called to [facility], EMS called for transport, daughter aware of transport, pt with no noted complaints, per hospice home may leave IV in place

## 2016-08-17 NOTE — Discharge Summary (Signed)
[facility] [place] NAME: Miguel Huber    MR#:  [id number]  DATE OF BIRTH:  05-24-1928  DATE OF ADMISSION:  08/06/2016 ADMITTING PHYSICIAN: Saundra Shelling, MD  DATE OF DISCHARGE: No discharge date for patient encounter.  PRIMARY CARE PHYSICIAN: Viviana Simpler, MD     ADMISSION DIAGNOSIS:  Hypotension, unspecified hypotension type [I95.9] Syncope, unspecified syncope type [R55] Leukocytosis, unspecified type [D72.829]  DISCHARGE DIAGNOSIS:  Principal Problem:   Acute on chronic respiratory failure with hypoxia ([place]) Active Problems:   Syncope   Acute respiratory distress   Palliative care by specialist   DNR (do not resuscitate)   Dyspnea   Pneumonia   Sepsis ([place])   Acute on chronic renal failure (HCC)   Fluid overload   Moderate to severe pulmonary hypertension   SECONDARY DIAGNOSIS:   Past Medical History:  Diagnosis Date  . Allergic rhinitis   . Cardiomyopathy   . COPD (chronic obstructive pulmonary disease) ([place])   . Diabetes mellitus   . DVT (deep venous thrombosis), right 5/13  . Erectile dysfunction   . GERD (gastroesophageal reflux disease)   . Hx of adenomatous colonic polyps   . Hyperlipidemia   . Hypertension   . Osteoarthritis   . Pulmonary fibrosis ([place])   . Renal insufficiency     .pro HOSPITAL COURSE:   The patient is 83 year old Caucasian male with medical history significant for history of chronic respiratory failure, pulmonary fibrosis, who presents to the hospital with complaints of syncopal episode. He was brought to emergency room where he was found to have elevated white blood cell count and hypotension. Code sepsis was called and patient received IV fluids and broad-spectrum antibiotic therapy. Chest x-ray revealed interstitial lung disease with infiltrates. He was admitted to the hospital, his condition deteriorated and he became progressively hypoxic, requiring ICU admission.  He was placed on BiPAP, high flow nasal cannulas, he was treated with broad-spectrum antibiotic therapy, pulmonology consultation was obtained. His blood cultures remained negative, MRSA PCR was negative, urine culture was negative. When his blood pressure stabilized, he was found to be fluid overloaded and diuresed. He was felt to have dysphagia with some aspiration , he was evaluated by speech therapist and recommended dysphagia 1 diet with thin liquids which he tolerated well. With conservative therapy. His condition improved and he was weaned down to 5 L of oxygen through nasal cannula, however, remained short of breath, dyspneic, palliative care consultation was obtained, patient chose palliation, hospice home discharge is being planned.    Patient has progressive respiratory failure and terminal illlness, he may survive less than two weeks with this condition.  Discussion by problem:  #Acute hypoxic respiratory failure on chronic respiratory failure; secondary to acute bronchitis with superimposed healthcare associated pneumonia and chronic pulmonary fibrosis. He improved clinically with diuresis, FiO2 decreased from 65% to 5 liters , appreciate pulmonology's input,, continue   Nebulizer treatments,  unable to get sputum cultures. Patient seen by palliative care again and decided on DO NOT RESUSCITATE, Discharge to hospice home when bed is available.   #.Sepsis- from acute bronchitis with superimposed healthcare associated pneumonia . The patient was treated with broad-spectrum antibiotic therapy and improved. Blood cultures negative for 5 days Urine culture with no growth Repeat chest x-ray 3/16 with superimposed pneumonia on the right side. Leukocytosis has some improved. The patient does have difficulty swallowing, coughing with swallowing, diet was downgraded to dysphagia 1 with thickened liquids, could  be related to recurrent aspirations. The patient has been stabilized, continues to have  shortness of breath, dyspnea, requiring morphine administration intermittently. He is going to be discharged to hospice home when bed is available.  #.Syncope secondary to Hypotension/vasovagal versus hypoxia versus cor pulmonale/shock, improved on IV fluids. Acute MI ruled out with negative troponins. Lactic acid is normal. Patient was seen by palliative care, hospice home was recommended, patient is agreeable, discharge when bed is ready  #. acute on chronic renal failure, known CKD stage III, improved with IV fluids , now off IV fluids and back on Lasix, creatinine is  at  baseline   # . chronic Pulmonary fibrosis  follow-up with pulmonology, weaned off high flow oxygen, will not escalate to high flow again, patient will be discharged to hospice home when bed is available  # Diabetes mellitus with Hyperglycemia due to steroids,  now off insulin and sliding scale. Continue diet as able. Steroids are discontinued.   DISCHARGE CONDITIONS:   poor  CONSULTS OBTAINED:  Treatment Team:  Allyne Gee, MD  DRUG ALLERGIES:   Allergies  Allergen Reactions  . Theophyllines Hives    DISCHARGE MEDICATIONS:   Current Discharge Medication List    START taking these medications   Details  lidocaine-prilocaine (EMLA) cream Apply topically as needed (Left rib cage). Qty: 30 g, Refills: 0    LORazepam (ATIVAN) 1 MG tablet Take 1 tablet (1 mg total) by mouth every 6 (six) hours as needed for anxiety. Qty: 30 tablet, Refills: 0    menthol-cetylpyridinium (CEPACOL) 3 MG lozenge Take 1 lozenge (3 mg total) by mouth as needed for sore throat. Qty: 100 tablet, Refills: 12    morphine 10 MG/5ML solution Take 2.5 mLs (5 mg total) by mouth every 4 (four) hours. Refills: 0    Morphine Sulfate (MORPHINE CONCENTRATE) 10 MG/0.5ML SOLN concentrated solution Take 0.25 mLs (5 mg total) by mouth every hour as needed for severe pain or shortness of breath. Qty: 30 mL, Refills: 0      CONTINUE  these medications which have CHANGED   Details  furosemide (LASIX) 40 MG tablet Take 1 tablet (40 mg total) by mouth daily. Qty: 30 tablet, Refills: 0    temazepam (RESTORIL) 30 MG capsule Take 1 capsule (30 mg total) by mouth at bedtime as needed for sleep. Qty: 12 capsule, Refills: 0      CONTINUE these medications which have NOT CHANGED   Details  ipratropium-albuterol (DUONEB) 0.5-2.5 (3) MG/3ML SOLN Take 3 mLs by nebulization every 6 (six) hours as needed (for wheezing/shortness of breath).       STOP taking these medications     aspirin EC 81 MG tablet      cholecalciferol (VITAMIN D) 1000 units tablet      glucose blood (PRODIGY NO CODING BLOOD GLUC) test strip      Insulin Syringe-Needle U-100 (B-D INS SYRINGE 0.5CC/30GX1/2") 30G X 1/2" 0.5 ML MISC      lisinopril (PRINIVIL,ZESTRIL) 10 MG tablet      NOVOLIN N 100 UNIT/ML injection      NOVOLIN R 100 UNIT/ML injection      omeprazole (PRILOSEC) 20 MG capsule      rosuvastatin (CRESTOR) 20 MG tablet          DISCHARGE INSTRUCTIONS:    No follow-up  If you experience worsening of your admission symptoms, develop shortness of breath, life threatening emergency, suicidal or homicidal thoughts you must seek medical attention immediately by calling  911 or calling your MD immediately  if symptoms less severe.  You Must read complete instructions/literature along with all the possible adverse reactions/side effects for all the Medicines you take and that have been prescribed to you. Take any new Medicines after you have completely understood and accept all the possible adverse reactions/side effects.   Please note  You were cared for by a hospitalist during your hospital stay. If you have any questions about your discharge medications or the care you received while you were in the hospital after you are discharged, you can call the unit and asked to speak with the hospitalist on call if the hospitalist that took care  of you is not available. Once you are discharged, your primary care physician will handle any further medical issues. Please note that NO REFILLS for any discharge medications will be authorized once you are discharged, as it is imperative that you return to your primary care physician (or establish a relationship with a primary care physician if you do not have one) for your aftercare needs so that they can reassess your need for medications and monitor your lab values.    Today   CHIEF COMPLAINT:   Chief Complaint  Patient presents with  . Loss of Consciousness    HISTORY OF PRESENT ILLNESS:  Miguel Huber  is a 83 y.o. male with a known history of chronic respiratory failure, pulmonary fibrosis, who presents to the hospital with complaints of syncopal episode. He was brought to emergency room where he was found to have elevated white blood cell count and hypotension. Code sepsis was called and patient received IV fluids and broad-spectrum antibiotic therapy. Chest x-ray revealed interstitial lung disease with infiltrates. He was admitted to the hospital, his condition deteriorated and he became progressively hypoxic, requiring ICU admission. He was placed on BiPAP, high flow nasal cannulas, he was treated with broad-spectrum antibiotic therapy, pulmonology consultation was obtained. His blood cultures remained negative, MRSA PCR was negative, urine culture was negative. When his blood pressure stabilized, he was found to be fluid overloaded and diuresed. He was felt to have dysphagia with some aspiration , he was evaluated by speech therapist and recommended dysphagia 1 diet with thin liquids which he tolerated well. With conservative therapy. His condition improved and he was weaned down to 5 L of oxygen through nasal cannula, however, remained short of breath, dyspneic, palliative care consultation was obtained, patient chose palliation, hospice home discharge is being planned. Patient will be  discharged when bed is available.  Discussion by problem:  #Acute hypoxic respiratory failure on chronic respiratory failure; secondary to acute bronchitis with superimposed healthcare associated pneumonia and chronic pulmonary fibrosis. He improved clinically with diuresis, FiO2 decreased from 65% to 5 liters , appreciate pulmonology's input,, continue   Nebulizer treatments,  unable to get sputum cultures. Patient seen by palliative care again and decided on DO NOT RESUSCITATE, Discharge to hospice home when bed is available.   #.Sepsis- from acute bronchitis with superimposed healthcare associated pneumonia . The patient was treated with broad-spectrum antibiotic therapy and improved. Blood cultures negative for 5 days Urine culture with no growth Repeat chest x-ray 3/16 with superimposed pneumonia on the right side. Leukocytosis has some improved. The patient does have difficulty swallowing, coughing with swallowing, diet was downgraded to dysphagia 1 with thickened liquids, could be related to recurrent aspirations. The patient has been stabilized, continues to have shortness of breath, dyspnea, requiring morphine administration intermittently. He is going to be discharged  to hospice home when bed is available.  #.Syncope secondary to Hypotension/vasovagal versus hypoxia versus cor pulmonale/shock, improved on IV fluids. Acute MI ruled out with negative troponins. Lactic acid is normal. Patient was seen by palliative care, hospice home was recommended, patient is agreeable, discharge when bed is ready  #. acute on chronic renal failure, known CKD stage III, improved with IV fluids , now off IV fluids and back on Lasix, creatinine is  at  baseline   # . chronic Pulmonary fibrosis  follow-up with pulmonology, weaned off high flow oxygen, will not escalate to high flow again, patient will be discharged to hospice home when bed is available  # Diabetes mellitus with Hyperglycemia due to  steroids,  now off insulin and sliding scale. Continue diet as able. Steroids are discontinued.     VITAL SIGNS:  Blood pressure 126/71, pulse 89, temperature 97.7 F (36.5 C), temperature source Oral, resp. rate (!) 24, height 5\' 7"  (1.702 m), weight 186 lb 1.1 oz (84.4 kg), SpO2 90 %.  I/O:    Intake/Output Summary (Last 24 hours) at 08/17/16 1638 Last data filed at 08/17/16 1200  Gross per 24 hour  Intake              600 ml  Output             1850 ml  Net            -1250 ml    PHYSICAL EXAMINATION:  GENERAL:  83 y.o.-year-old patient lying in the bed with no acute distress.  EYES: Pupils equal, round, reactive to light and accommodation. No scleral icterus. Extraocular muscles intact.  HEENT: Head atraumatic, normocephalic. Oropharynx and nasopharynx clear.  NECK:  Supple, no jugular venous distention. No thyroid enlargement, no tenderness.  LUNGS: Normal breath sounds bilaterally, no wheezing, rales,rhonchi or crepitation. No use of accessory muscles of respiration.  CARDIOVASCULAR: S1, S2 normal. No murmurs, rubs, or gallops.  ABDOMEN: Soft, non-tender, non-distended. Bowel sounds present. No organomegaly or mass.  EXTREMITIES: No pedal edema, cyanosis, or clubbing.  NEUROLOGIC: Cranial nerves II through XII are intact. Muscle strength 5/5 in all extremities. Sensation intact. Gait not checked.  PSYCHIATRIC: The patient is alert and oriented x 3.  SKIN: No obvious rash, lesion, or ulcer.   DATA REVIEW:   CBC  Recent Labs Lab 08/14/16 0527  WBC 16.8*  HGB 14.1  HCT 41.5  PLT 250    Chemistries   Recent Labs Lab 08/13/16 0049 08/14/16 0527  NA 145 141  K 4.8 5.0  CL 114* 109  CO2 22 25  GLUCOSE 160* 179*  BUN 61* 59*  CREATININE 2.04* 1.89*  CALCIUM 8.5* 8.3*  MG 2.8*  --     Cardiac Enzymes No results for input(s): TROPONINI in the last 168 hours.  Microbiology Results  Results for orders placed or performed during the hospital encounter of  08/06/16  Blood culture (routine x 2)     Status: None   Collection Time: 08/06/16 10:17 PM  Result Value Ref Range Status   Specimen Description BLOOD RIGHT ANTECUBITAL  Final   Special Requests BOTTLES DRAWN AEROBIC AND ANAEROBIC BCAV  Final   Culture NO GROWTH 5 DAYS  Final   Report Status 08/11/2016 FINAL  Final  Blood culture (routine x 2)     Status: None   Collection Time: 08/06/16 10:17 PM  Result Value Ref Range Status   Specimen Description BLOOD LEFT ANTECUBITAL  Final   Special  Requests BOTTLES DRAWN AEROBIC AND ANAEROBIC BCAV  Final   Culture NO GROWTH 5 DAYS  Final   Report Status 08/11/2016 FINAL  Final  Urine culture     Status: None   Collection Time: 08/06/16 10:17 PM  Result Value Ref Range Status   Specimen Description URINE, RANDOM  Final   Special Requests NONE  Final   Culture   Final    NO GROWTH Performed at [facility], [place] [address] [place], [place] [address]    Report Status 08/08/2016 FINAL  Final  MRSA PCR Screening     Status: None   Collection Time: 08/08/16 11:39 AM  Result Value Ref Range Status   MRSA by PCR NEGATIVE NEGATIVE Final    Comment:        The GeneXpert MRSA Assay (FDA approved for NASAL specimens only), is one component of a comprehensive MRSA colonization surveillance program. It is not intended to diagnose MRSA infection nor to guide or monitor treatment for MRSA infections.     RADIOLOGY:  No results found.  EKG:   Orders placed or performed during the hospital encounter of 08/06/16  . ED EKG  . ED EKG  . EKG 12-Lead  . EKG 12-Lead      Management plans discussed with the patient, family and they are in agreement.  CODE STATUS:     Code Status Orders        Start     Ordered   08/13/16 1421  Do not attempt resuscitation (DNR)  Continuous    Question Answer Comment  In the event of cardiac or respiratory ARREST Do not call a "code blue"   In the event of cardiac or respiratory ARREST Do not  perform Intubation, CPR, defibrillation or ACLS   In the event of cardiac or respiratory ARREST Use medication by any route, position, wound care, and other measures to relive pain and suffering. May use oxygen, suction and manual treatment of airway obstruction as needed for comfort.      08/13/16 1420    Code Status History    Date Active Date Inactive Code Status Order ID Comments User Context   08/08/2016  7:00 PM 08/13/2016  2:20 PM Partial Code [id number]  Awilda Bill, NP Inpatient   08/07/2016  2:53 AM 08/08/2016  7:00 PM Full Code [id number]  Saundra Shelling, MD ED   11/17/2015 12:42 AM 11/18/2015  4:36 PM Full Code [id number]  Quintella Baton, MD Inpatient    Advance Directive Documentation     Most Recent Value  Type of Advance Directive  Living will, Healthcare Power of Attorney  Pre-existing out of facility DNR order (yellow form or pink MOST form)  -  "MOST" Form in Place?  -      TOTAL TIME TAKING CARE OF THIS PATIENT: 40 minutes.    Dustin Flock M.D on 08/17/2016 at 4:38 PM  Between 7am to 6pm - Pager - [phone]  After 6pm go to [email or web address] - password [email or web address] [place]  Office  [phone]  CC: Primary care physician; Viviana Simpler, MD

## 2016-08-18 ENCOUNTER — Telehealth: Payer: Self-pay | Admitting: *Deleted

## 2016-08-18 NOTE — Telephone Encounter (Signed)
Unable to leave vm; attempted to complete TCM and confirm hosp f/u appt

## 2016-08-19 NOTE — Telephone Encounter (Addendum)
Unable to Lm; attempted to complete TCM and confirm hosp f/u appt

## 2016-08-24 DEATH — deceased

## 2016-08-25 ENCOUNTER — Telehealth: Payer: Self-pay | Admitting: Internal Medicine

## 2016-08-25 NOTE — Telephone Encounter (Signed)
Hospice called to let Dr.Letvak know patient expired 2016-09-04 at 1:17 pm.

## 2016-08-25 NOTE — Telephone Encounter (Signed)
Yes, I heard about it

## 2016-08-28 ENCOUNTER — Encounter: Payer: Medicare Other | Admitting: Internal Medicine

## 2016-10-11 IMAGING — CT CT CHEST W/O CM
2 of 4 series · 16 of 46 positions shown, 18 images · non-contrast
Comparison: Chest radiograph performed earlier today at [DATE] p.m.

CLINICAL DATA: Acute onset of difficulty breathing, nausea and
generalized weakness. Shortness of breath. Initial encounter.

EXAM:
CT CHEST WITHOUT CONTRAST
TECHNIQUE: Multidetector CT imaging of the chest was performed following the
standard protocol without IV contrast.

[Series 3: lungs · axial · 0.74mm/px · z∈[-334,-54]mm · 13 of 155 slices shown, 15 images]
[im 8/155  soft-tissue]
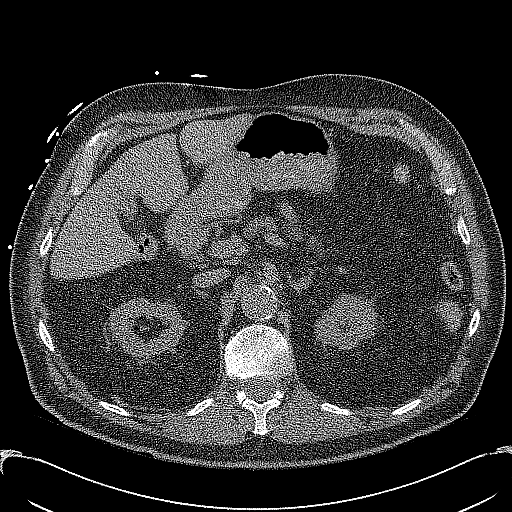
[im 8/155  bone]
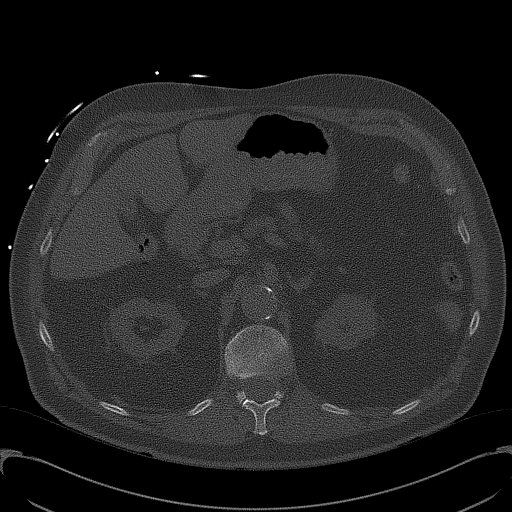
[im 22/155  soft-tissue]
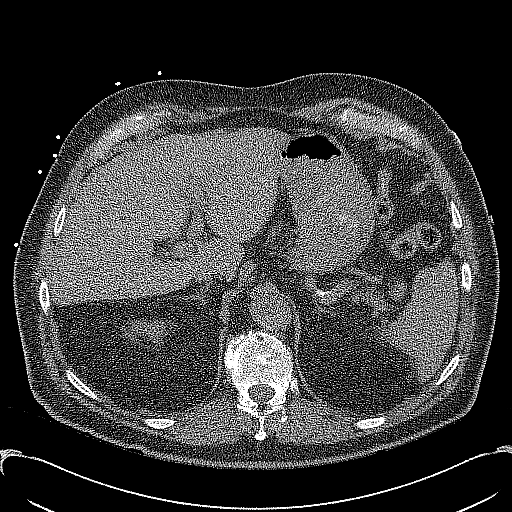
[im 36/155  soft-tissue]
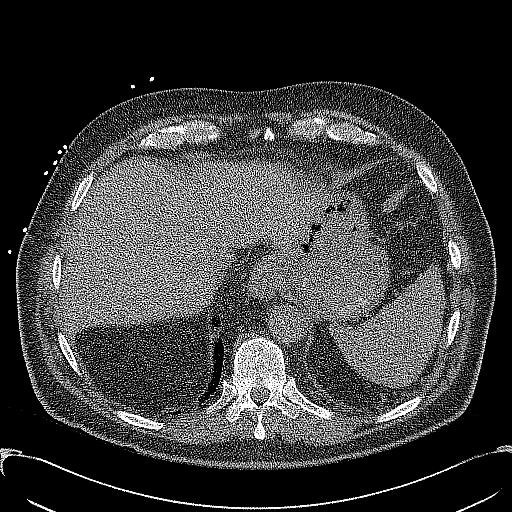
[im 43/155  soft-tissue]
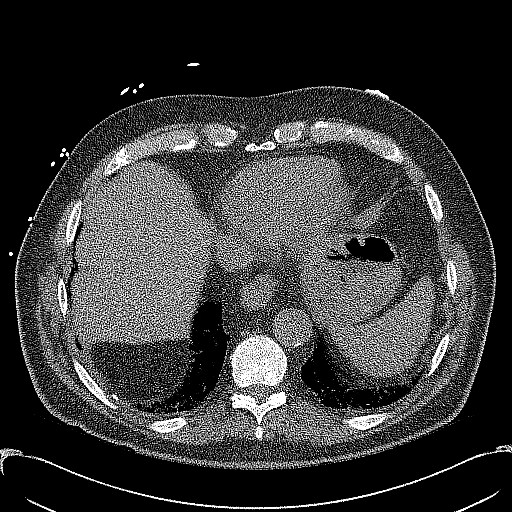
[im 57/155  soft-tissue]
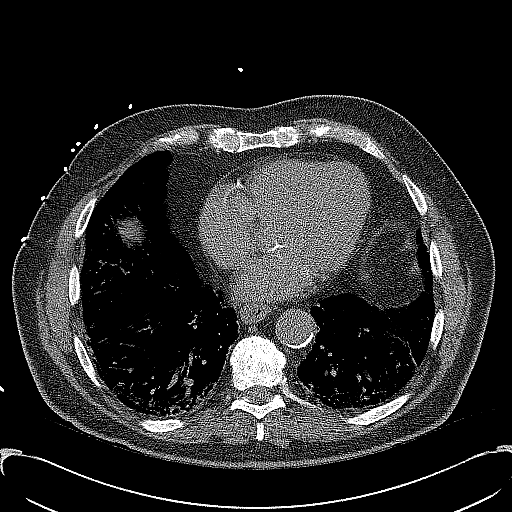
[im 64/155  soft-tissue]
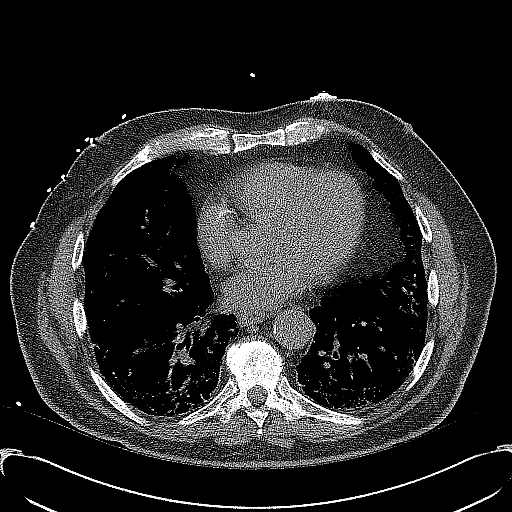
[im 78/155  soft-tissue]
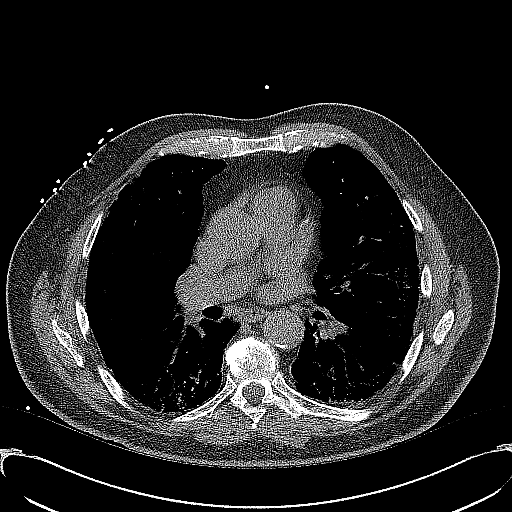
[im 92/155  soft-tissue]
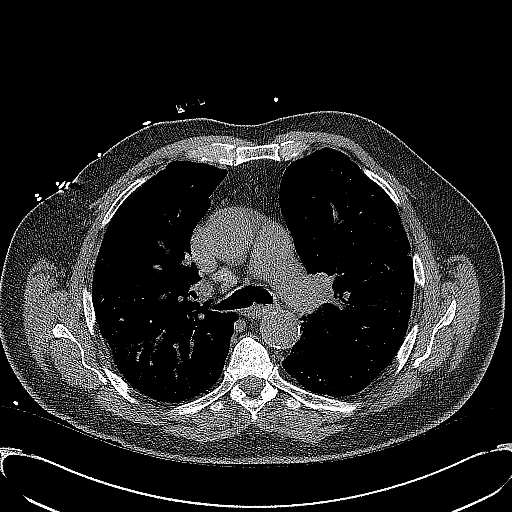
[im 99/155  soft-tissue]
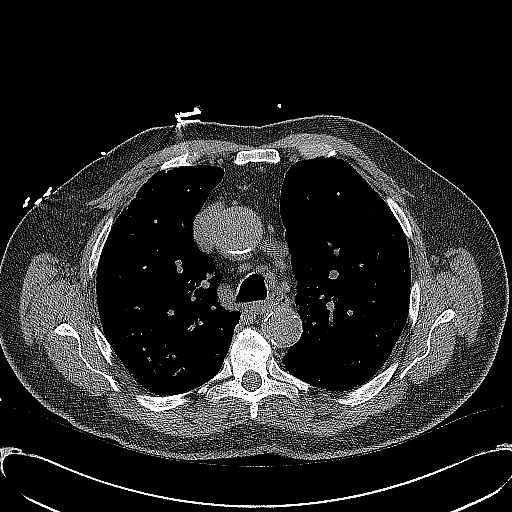
[im 99/155  bone]
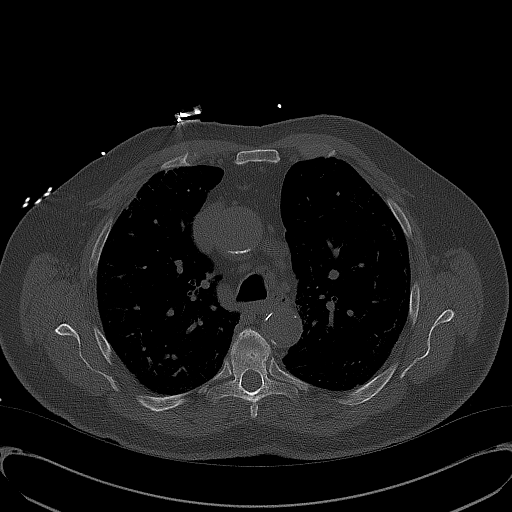
[im 113/155  soft-tissue]
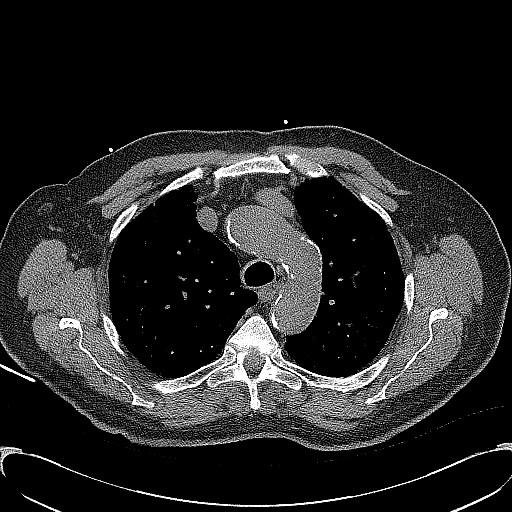
[im 120/155  soft-tissue]
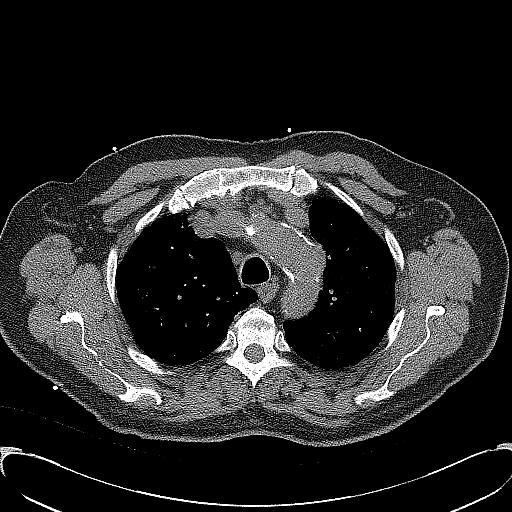
[im 134/155  soft-tissue]
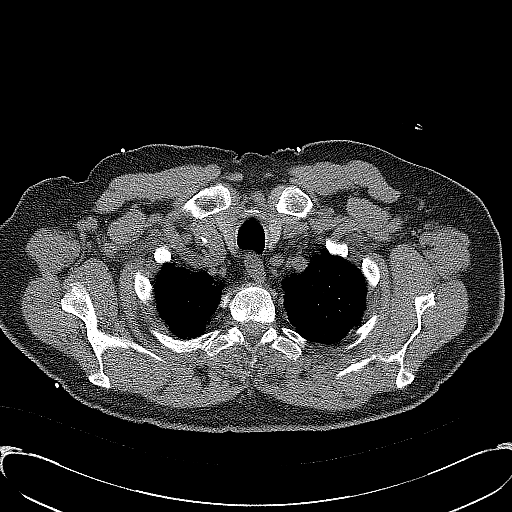
[im 148/155  soft-tissue]
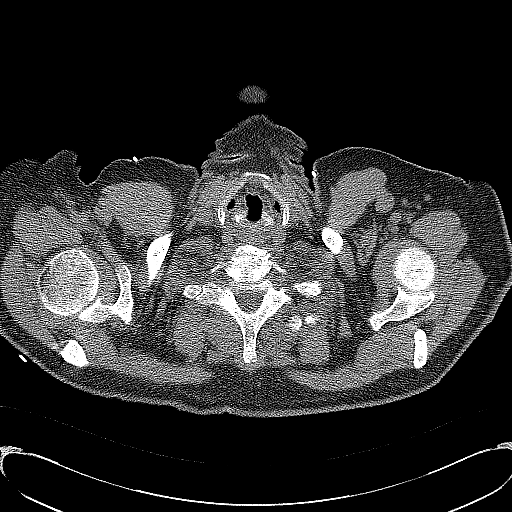

[Series 5: routine chest wo cor · coronal · 0.65mm/px · 3 of 139 slices shown]
[im 47/139  soft-tissue]
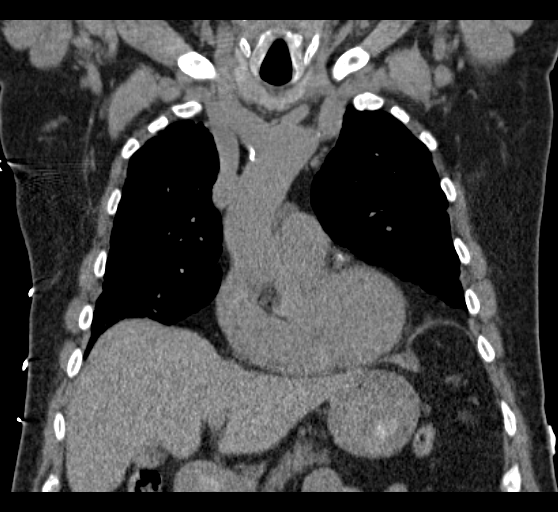
[im 62/139  soft-tissue]
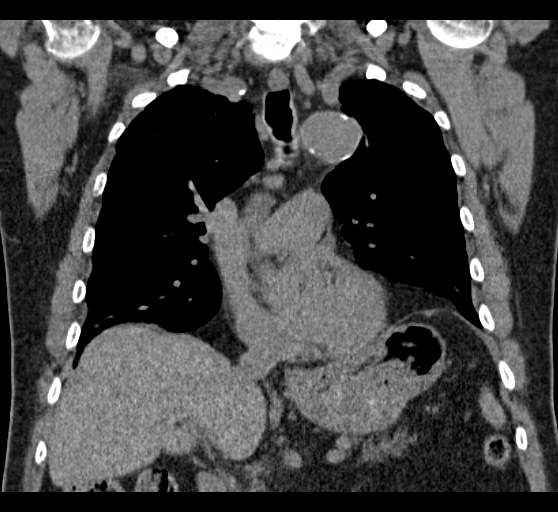
[im 77/139  soft-tissue]
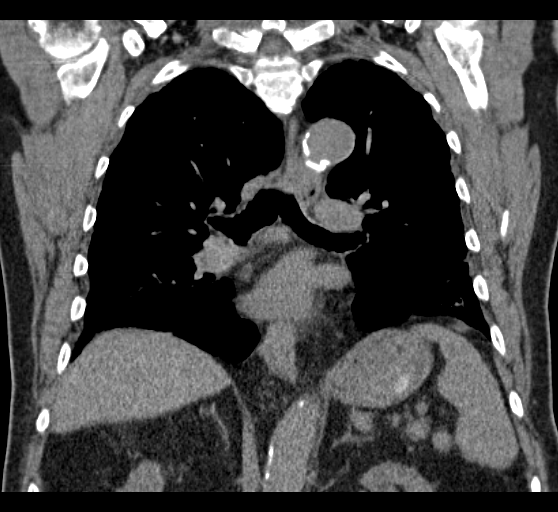

[16 of 46 positions shown; findings below may reference images not displayed]

FINDINGS: Cardiovascular: Scattered calcification is noted along the thoracic
aorta. Slight bulge of the distal aortic arch is stable from 5000.
There is no evidence of aortic aneurysm. Scattered calcification is
noted along the proximal great vessels. Scattered coronary artery
calcifications are seen.

Mediastinum: The mediastinum is otherwise unremarkable in
appearance. Visualized mediastinal nodes remain borderline normal in
size. No pericardial effusion is seen. The thyroid gland is
unremarkable. No axillary lymphadenopathy is seen.

Lungs/Pleura: Bilateral emphysematous change is noted. Peripheral
scarring is noted bilaterally, with mild fibrotic change. No masses
are identified. There is no evidence of pleural effusion or
pneumothorax.

Upper Abdomen: The visualized portions of the liver and spleen are
grossly unremarkable. The gallbladder is relatively decompressed,
with suggestion of a tiny stone. The visualized portions of the
pancreas and adrenal glands are within normal limits. Nonspecific
perinephric stranding is noted bilaterally.

Musculoskeletal: No acute osseous abnormalities are identified.
There is mild grade 1 anterolisthesis of C7 on T1. Mild degenerative
change is noted along the mid thoracic spine, with endplate
sclerosis.
IMPRESSION: 1. Bilateral emphysema noted, with mild fibrotic change and
peripheral scarring.
2. Scattered coronary artery calcifications seen.
3. Suggestion of tiny gallstone. Gallbladder contracted and
otherwise unremarkable.
4. Mild degenerative change at the mid thoracic spine.

## 2016-10-30 ENCOUNTER — Ambulatory Visit: Payer: Medicare Other | Admitting: Family

## 2017-07-04 IMAGING — DX DG CHEST 1V PORT
1 series · 1 of 1 positions shown · non-contrast
Comparison: 08/07/2016

CLINICAL DATA: Acute onset shortness of breath.  Cough, congestion

EXAM:
PORTABLE CHEST 1 VIEW

[chest ap]
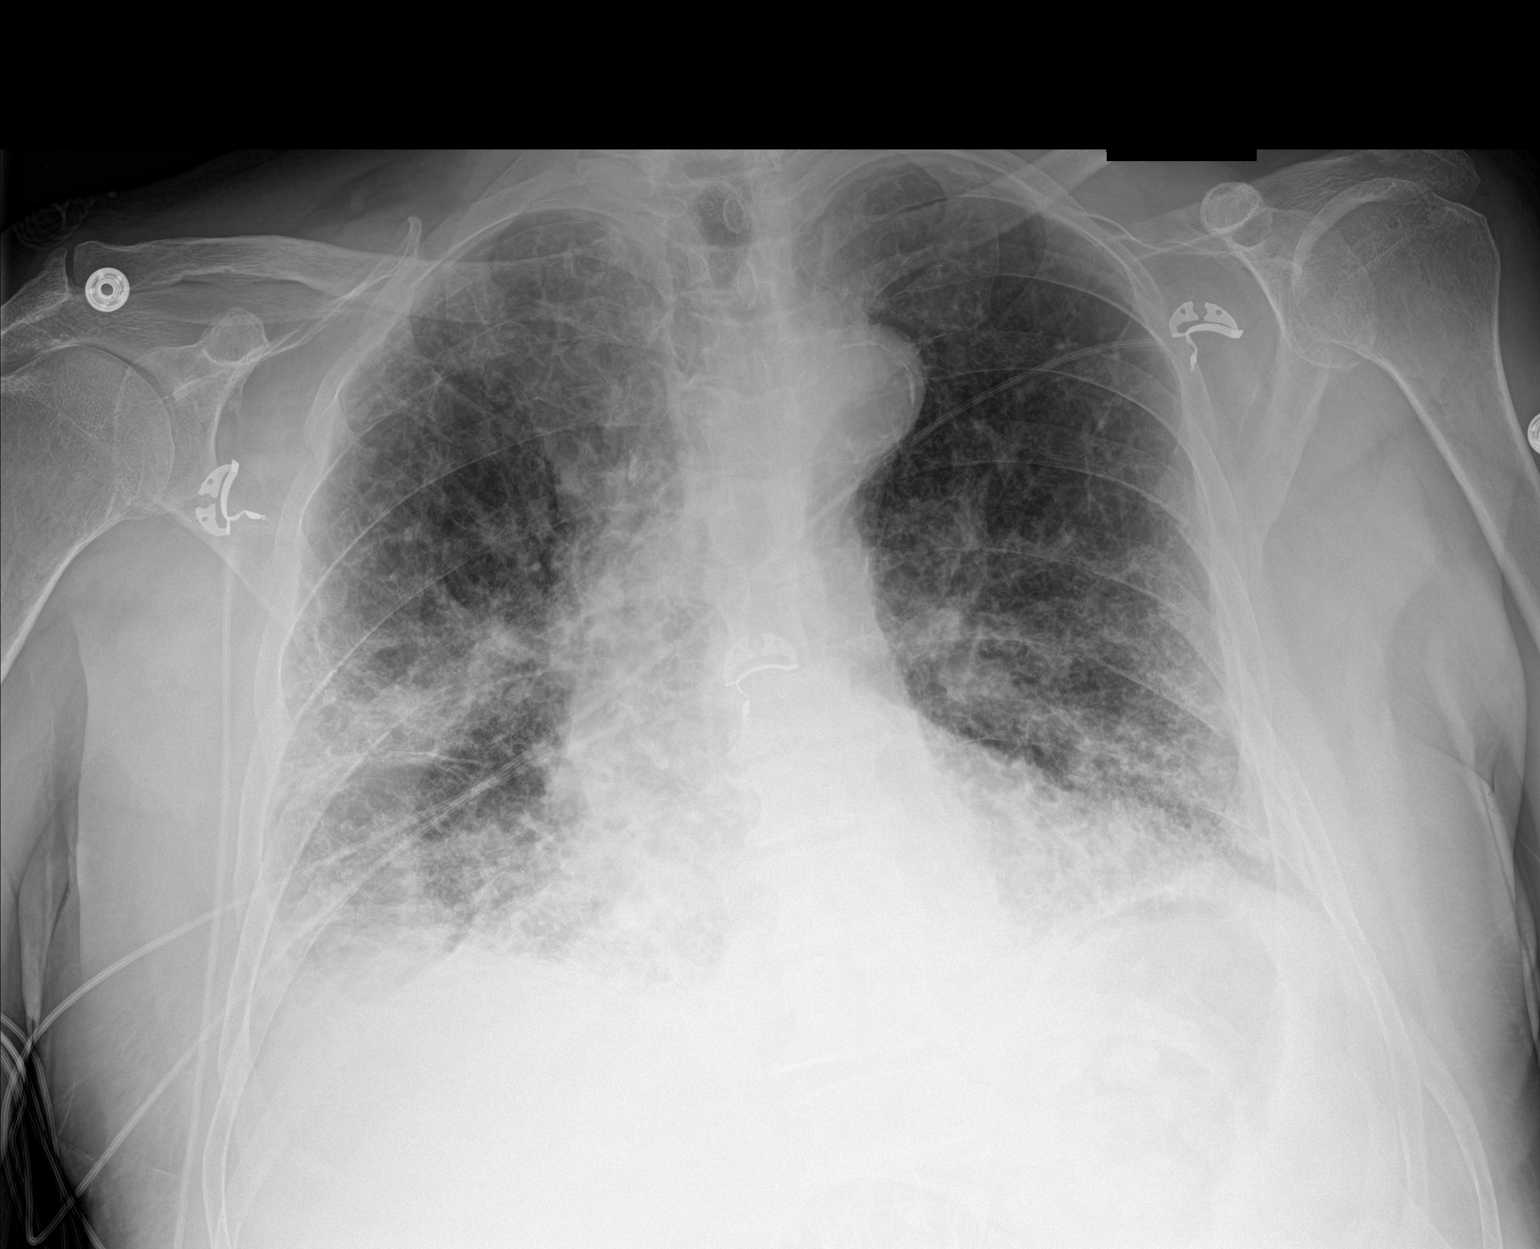

[1 of 1 positions shown; findings below may reference images not displayed]

FINDINGS: Diffuse chronic interstitial prominence throughout the lungs
compatible with severe chronic interstitial lung disease/ fibrosis.
More confluent opacities noted in the inferior right upper lobe and
right lung base. Cannot exclude superimposed pneumonia. Heart is
upper limits normal in size. No definite visible effusion.
IMPRESSION: Severe chronic lung disease/ fibrosis.

Focal airspace opacities in the right mid and lower lung. Findings
concerning for superimposed pneumonia.

## 2017-07-09 IMAGING — DX DG CHEST 1V
1 series · 1 of 1 positions shown · non-contrast
Comparison: Chest radiograph dated 08/08/2016

CLINICAL DATA: 88-year-old male with dyspnea.

EXAM:
CHEST 1 VIEW

[chest ap]
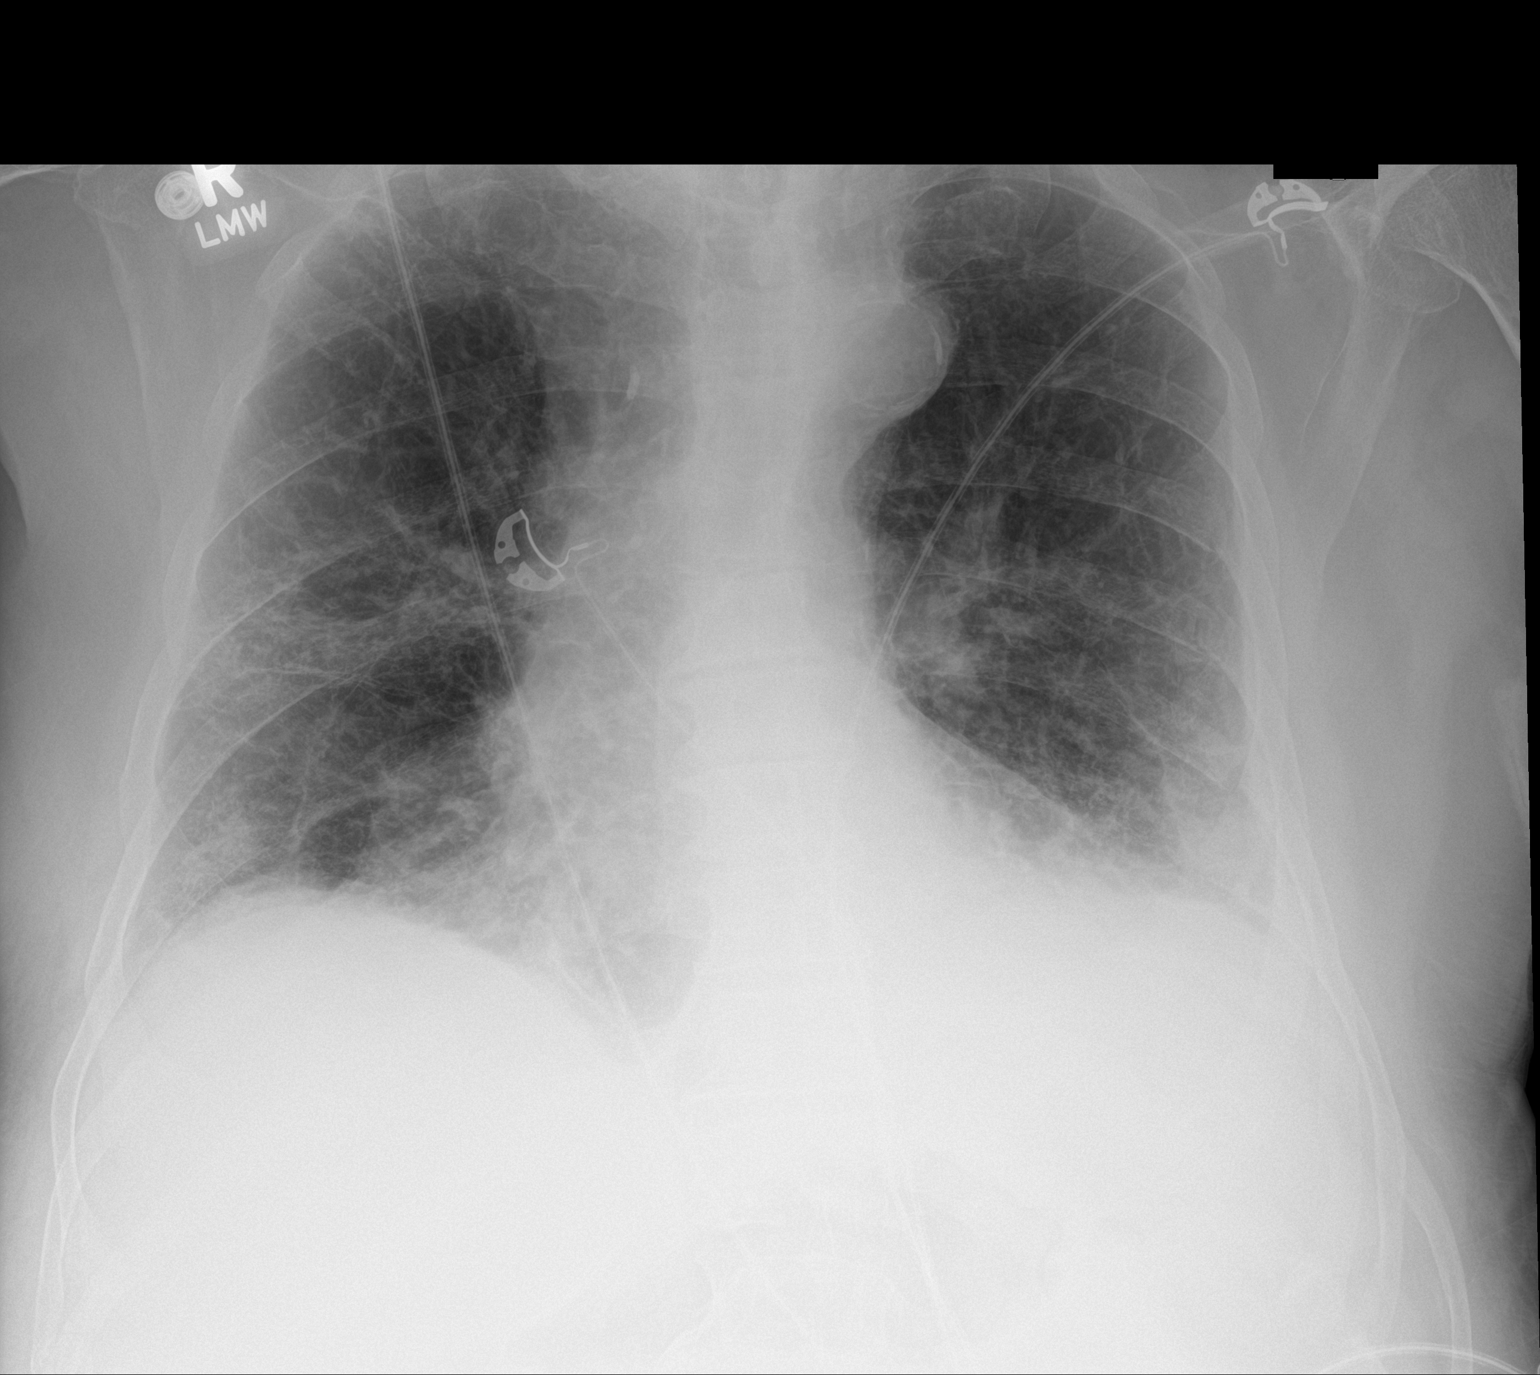

[1 of 1 positions shown; findings below may reference images not displayed]

FINDINGS: There is background of interstitial coarsening and pulmonary
fibrosis. Bibasilar densities most consistent with atelectatic
changes and scarring. Small focal area of density at the left lung
base may be chronic, however superimposed pneumonia is not excluded.
Clinical correlation is recommended. There is no pleural effusion or
pneumothorax. Stable cardiac silhouette. No acute osseous pathology.
IMPRESSION: Severe underlying chronic lung disease/fibrosis. Focal left lung
base hazy density may represent atelectasis/ scarring versus
developing infiltrate. Clinical correlation is recommended.
